# Patient Record
Sex: Female | Born: 1965 | Race: White | Hispanic: No | Marital: Married | Smoking: Never smoker
Health system: Southern US, Community
[De-identification: ages and names within clinical notes are randomized; demographics above are authoritative.]

## PROBLEM LIST (undated history)

## (undated) DIAGNOSIS — I10 Essential (primary) hypertension: Secondary | ICD-10-CM

## (undated) DIAGNOSIS — F32A Depression, unspecified: Secondary | ICD-10-CM

## (undated) DIAGNOSIS — F319 Bipolar disorder, unspecified: Secondary | ICD-10-CM

## (undated) DIAGNOSIS — M1990 Unspecified osteoarthritis, unspecified site: Secondary | ICD-10-CM

## (undated) DIAGNOSIS — F419 Anxiety disorder, unspecified: Secondary | ICD-10-CM

## (undated) DIAGNOSIS — F329 Major depressive disorder, single episode, unspecified: Secondary | ICD-10-CM

## (undated) DIAGNOSIS — G4730 Sleep apnea, unspecified: Secondary | ICD-10-CM

## (undated) HISTORY — PX: OTHER SURGICAL HISTORY: SHX169

## (undated) HISTORY — PX: BACK SURGERY: SHX140

## (undated) HISTORY — PX: ABDOMINAL HYSTERECTOMY: SHX81

## (undated) HISTORY — PX: TOE SURGERY: SHX1073

## (undated) HISTORY — PX: REDUCTION MAMMAPLASTY: SUR839

---

## 1998-03-26 ENCOUNTER — Inpatient Hospital Stay (HOSPITAL_COMMUNITY): Admission: AD | Admit: 1998-03-26 | Discharge: 1998-03-30 | Payer: Self-pay

## 1998-03-26 ENCOUNTER — Ambulatory Visit (HOSPITAL_COMMUNITY): Admission: RE | Admit: 1998-03-26 | Discharge: 1998-03-26 | Payer: Self-pay

## 1998-03-31 ENCOUNTER — Encounter (HOSPITAL_COMMUNITY): Admission: RE | Admit: 1998-03-31 | Discharge: 1998-06-29 | Payer: Self-pay

## 1998-05-04 ENCOUNTER — Encounter: Payer: Self-pay | Admitting: Family Medicine

## 1998-05-04 ENCOUNTER — Ambulatory Visit (HOSPITAL_COMMUNITY): Admission: RE | Admit: 1998-05-04 | Discharge: 1998-05-04 | Payer: Self-pay | Admitting: Family Medicine

## 1998-12-15 ENCOUNTER — Encounter: Admission: RE | Admit: 1998-12-15 | Discharge: 1999-01-29 | Payer: Self-pay | Admitting: Family Medicine

## 1999-06-25 ENCOUNTER — Other Ambulatory Visit: Admission: RE | Admit: 1999-06-25 | Discharge: 1999-06-25 | Payer: Self-pay | Admitting: Family Medicine

## 1999-11-16 ENCOUNTER — Inpatient Hospital Stay (HOSPITAL_COMMUNITY): Admission: EM | Admit: 1999-11-16 | Discharge: 1999-11-17 | Payer: Self-pay | Admitting: Psychiatry

## 2000-05-01 ENCOUNTER — Encounter: Payer: Self-pay | Admitting: Family Medicine

## 2000-05-01 ENCOUNTER — Ambulatory Visit (HOSPITAL_COMMUNITY): Admission: RE | Admit: 2000-05-01 | Discharge: 2000-05-01 | Payer: Self-pay | Admitting: Family Medicine

## 2001-09-06 ENCOUNTER — Other Ambulatory Visit: Admission: RE | Admit: 2001-09-06 | Discharge: 2001-09-06 | Payer: Self-pay | Admitting: Family Medicine

## 2001-12-06 ENCOUNTER — Ambulatory Visit (HOSPITAL_BASED_OUTPATIENT_CLINIC_OR_DEPARTMENT_OTHER): Admission: RE | Admit: 2001-12-06 | Discharge: 2001-12-06 | Payer: Self-pay | Admitting: Family Medicine

## 2001-12-15 ENCOUNTER — Encounter: Payer: Self-pay | Admitting: Family Medicine

## 2001-12-15 ENCOUNTER — Ambulatory Visit (HOSPITAL_COMMUNITY): Admission: RE | Admit: 2001-12-15 | Discharge: 2001-12-15 | Payer: Self-pay | Admitting: Family Medicine

## 2002-09-11 ENCOUNTER — Other Ambulatory Visit: Admission: RE | Admit: 2002-09-11 | Discharge: 2002-09-11 | Payer: Self-pay | Admitting: Family Medicine

## 2002-09-16 ENCOUNTER — Encounter: Payer: Self-pay | Admitting: Internal Medicine

## 2002-09-30 ENCOUNTER — Ambulatory Visit (HOSPITAL_COMMUNITY): Admission: RE | Admit: 2002-09-30 | Discharge: 2002-09-30 | Payer: Self-pay | Admitting: Family Medicine

## 2002-09-30 ENCOUNTER — Encounter: Payer: Self-pay | Admitting: Family Medicine

## 2003-04-15 ENCOUNTER — Inpatient Hospital Stay (HOSPITAL_COMMUNITY): Admission: EM | Admit: 2003-04-15 | Discharge: 2003-04-18 | Payer: Self-pay | Admitting: Emergency Medicine

## 2003-04-25 ENCOUNTER — Inpatient Hospital Stay (HOSPITAL_COMMUNITY): Admission: EM | Admit: 2003-04-25 | Discharge: 2003-05-02 | Payer: Self-pay | Admitting: Psychiatry

## 2003-04-30 ENCOUNTER — Encounter: Payer: Self-pay | Admitting: Neurology

## 2003-08-07 ENCOUNTER — Encounter: Payer: Self-pay | Admitting: Internal Medicine

## 2003-08-15 ENCOUNTER — Ambulatory Visit (HOSPITAL_COMMUNITY): Admission: RE | Admit: 2003-08-15 | Discharge: 2003-08-15 | Payer: Self-pay | Admitting: Family Medicine

## 2003-11-05 ENCOUNTER — Ambulatory Visit (HOSPITAL_COMMUNITY): Admission: RE | Admit: 2003-11-05 | Discharge: 2003-11-05 | Payer: Self-pay | Admitting: Family Medicine

## 2004-02-09 ENCOUNTER — Encounter
Admission: RE | Admit: 2004-02-09 | Discharge: 2004-03-19 | Payer: Self-pay | Admitting: Physical Medicine & Rehabilitation

## 2004-02-13 ENCOUNTER — Other Ambulatory Visit: Admission: RE | Admit: 2004-02-13 | Discharge: 2004-02-13 | Payer: Self-pay | Admitting: Family Medicine

## 2004-03-05 ENCOUNTER — Encounter
Admission: RE | Admit: 2004-03-05 | Discharge: 2004-05-07 | Payer: Self-pay | Admitting: Physical Medicine & Rehabilitation

## 2004-03-19 ENCOUNTER — Encounter
Admission: RE | Admit: 2004-03-19 | Discharge: 2004-06-17 | Payer: Self-pay | Admitting: Physical Medicine & Rehabilitation

## 2004-03-23 ENCOUNTER — Ambulatory Visit: Payer: Self-pay | Admitting: Physical Medicine & Rehabilitation

## 2004-03-24 ENCOUNTER — Ambulatory Visit (HOSPITAL_COMMUNITY): Admission: RE | Admit: 2004-03-24 | Discharge: 2004-03-24 | Payer: Self-pay | Admitting: Family Medicine

## 2004-05-25 ENCOUNTER — Ambulatory Visit: Payer: Self-pay | Admitting: Physical Medicine & Rehabilitation

## 2004-07-20 ENCOUNTER — Encounter
Admission: RE | Admit: 2004-07-20 | Discharge: 2004-10-18 | Payer: Self-pay | Admitting: Physical Medicine & Rehabilitation

## 2004-07-22 ENCOUNTER — Ambulatory Visit: Payer: Self-pay | Admitting: Physical Medicine & Rehabilitation

## 2004-09-16 ENCOUNTER — Ambulatory Visit: Payer: Self-pay | Admitting: Physical Medicine & Rehabilitation

## 2004-11-10 ENCOUNTER — Encounter
Admission: RE | Admit: 2004-11-10 | Discharge: 2005-02-08 | Payer: Self-pay | Admitting: Physical Medicine & Rehabilitation

## 2004-11-11 ENCOUNTER — Ambulatory Visit: Payer: Self-pay | Admitting: Physical Medicine & Rehabilitation

## 2005-01-06 ENCOUNTER — Ambulatory Visit: Payer: Self-pay | Admitting: Physical Medicine & Rehabilitation

## 2005-02-09 ENCOUNTER — Encounter
Admission: RE | Admit: 2005-02-09 | Discharge: 2005-05-10 | Payer: Self-pay | Admitting: Physical Medicine & Rehabilitation

## 2005-02-10 ENCOUNTER — Ambulatory Visit: Payer: Self-pay | Admitting: Physical Medicine & Rehabilitation

## 2005-03-08 ENCOUNTER — Ambulatory Visit: Payer: Self-pay | Admitting: Physical Medicine & Rehabilitation

## 2005-03-31 ENCOUNTER — Other Ambulatory Visit: Admission: RE | Admit: 2005-03-31 | Discharge: 2005-03-31 | Payer: Self-pay | Admitting: Family Medicine

## 2005-05-02 ENCOUNTER — Ambulatory Visit: Payer: Self-pay | Admitting: Physical Medicine & Rehabilitation

## 2005-05-29 ENCOUNTER — Encounter
Admission: RE | Admit: 2005-05-29 | Discharge: 2005-08-27 | Payer: Self-pay | Admitting: Physical Medicine & Rehabilitation

## 2005-06-29 ENCOUNTER — Ambulatory Visit: Payer: Self-pay | Admitting: Physical Medicine & Rehabilitation

## 2005-08-26 ENCOUNTER — Encounter
Admission: RE | Admit: 2005-08-26 | Discharge: 2005-11-24 | Payer: Self-pay | Admitting: Physical Medicine & Rehabilitation

## 2005-08-26 ENCOUNTER — Ambulatory Visit: Payer: Self-pay | Admitting: Physical Medicine & Rehabilitation

## 2005-12-22 ENCOUNTER — Ambulatory Visit: Payer: Self-pay | Admitting: Family Medicine

## 2006-01-12 ENCOUNTER — Ambulatory Visit: Payer: Self-pay | Admitting: Sports Medicine

## 2006-03-15 ENCOUNTER — Ambulatory Visit: Payer: Self-pay | Admitting: Family Medicine

## 2006-05-08 ENCOUNTER — Other Ambulatory Visit: Admission: RE | Admit: 2006-05-08 | Discharge: 2006-05-08 | Payer: Self-pay | Admitting: Family Medicine

## 2006-08-17 ENCOUNTER — Encounter: Admission: RE | Admit: 2006-08-17 | Discharge: 2006-10-23 | Payer: Self-pay | Admitting: Family Medicine

## 2007-01-26 ENCOUNTER — Encounter (INDEPENDENT_AMBULATORY_CARE_PROVIDER_SITE_OTHER): Payer: Self-pay | Admitting: Family Medicine

## 2007-01-26 ENCOUNTER — Ambulatory Visit (HOSPITAL_COMMUNITY): Admission: RE | Admit: 2007-01-26 | Discharge: 2007-01-26 | Payer: Self-pay | Admitting: Family Medicine

## 2007-01-26 ENCOUNTER — Ambulatory Visit: Payer: Self-pay | Admitting: Vascular Surgery

## 2007-07-27 ENCOUNTER — Ambulatory Visit: Payer: Self-pay | Admitting: Internal Medicine

## 2007-07-27 DIAGNOSIS — G4733 Obstructive sleep apnea (adult) (pediatric): Secondary | ICD-10-CM | POA: Insufficient documentation

## 2007-08-17 ENCOUNTER — Encounter: Payer: Self-pay | Admitting: Internal Medicine

## 2007-08-17 ENCOUNTER — Ambulatory Visit (HOSPITAL_BASED_OUTPATIENT_CLINIC_OR_DEPARTMENT_OTHER): Admission: RE | Admit: 2007-08-17 | Discharge: 2007-08-17 | Payer: Self-pay | Admitting: Internal Medicine

## 2007-08-25 ENCOUNTER — Ambulatory Visit: Payer: Self-pay | Admitting: Internal Medicine

## 2007-08-30 ENCOUNTER — Ambulatory Visit: Payer: Self-pay | Admitting: Internal Medicine

## 2007-09-05 ENCOUNTER — Telehealth (INDEPENDENT_AMBULATORY_CARE_PROVIDER_SITE_OTHER): Payer: Self-pay | Admitting: *Deleted

## 2007-10-29 ENCOUNTER — Ambulatory Visit: Payer: Self-pay | Admitting: Internal Medicine

## 2008-02-01 ENCOUNTER — Other Ambulatory Visit: Admission: RE | Admit: 2008-02-01 | Discharge: 2008-02-01 | Payer: Self-pay | Admitting: Family Medicine

## 2008-02-28 ENCOUNTER — Ambulatory Visit: Payer: Self-pay | Admitting: Internal Medicine

## 2008-03-14 ENCOUNTER — Encounter: Payer: Self-pay | Admitting: Internal Medicine

## 2009-03-06 ENCOUNTER — Telehealth: Payer: Self-pay | Admitting: Internal Medicine

## 2009-08-18 ENCOUNTER — Encounter: Admission: RE | Admit: 2009-08-18 | Discharge: 2009-11-16 | Payer: Self-pay | Admitting: Family Medicine

## 2009-11-05 ENCOUNTER — Encounter: Admission: RE | Admit: 2009-11-05 | Discharge: 2009-11-05 | Payer: Self-pay | Admitting: Family Medicine

## 2009-11-10 ENCOUNTER — Encounter: Admission: RE | Admit: 2009-11-10 | Discharge: 2009-11-10 | Payer: Self-pay | Admitting: Family Medicine

## 2009-11-18 ENCOUNTER — Encounter: Admission: RE | Admit: 2009-11-18 | Discharge: 2009-12-16 | Payer: Self-pay | Admitting: Family Medicine

## 2009-12-14 ENCOUNTER — Ambulatory Visit (HOSPITAL_BASED_OUTPATIENT_CLINIC_OR_DEPARTMENT_OTHER): Admission: RE | Admit: 2009-12-14 | Discharge: 2009-12-14 | Payer: Self-pay | Admitting: Specialist

## 2010-01-18 HISTORY — PX: BREAST SURGERY: SHX581

## 2010-01-22 ENCOUNTER — Encounter: Admission: RE | Admit: 2010-01-22 | Discharge: 2010-01-22 | Payer: Self-pay | Admitting: Family Medicine

## 2010-03-11 ENCOUNTER — Other Ambulatory Visit: Admission: RE | Admit: 2010-03-11 | Discharge: 2010-03-11 | Payer: Self-pay | Admitting: Family Medicine

## 2010-08-24 ENCOUNTER — Other Ambulatory Visit: Payer: Self-pay | Admitting: Obstetrics and Gynecology

## 2010-09-05 LAB — DIFFERENTIAL
Eosinophils Relative: 9 % — ABNORMAL HIGH (ref 0–5)
Lymphocytes Relative: 33 % (ref 12–46)
Lymphs Abs: 1.8 10*3/uL (ref 0.7–4.0)
Monocytes Absolute: 0.4 10*3/uL (ref 0.1–1.0)
Monocytes Relative: 8 % (ref 3–12)

## 2010-09-05 LAB — BASIC METABOLIC PANEL
Chloride: 104 mEq/L (ref 96–112)
GFR calc non Af Amer: >60 mL/min (ref >60)
Potassium: 3.4 mEq/L — ABNORMAL LOW (ref 3.5–5.1)
Sodium: 140 mEq/L (ref 135–145)

## 2010-09-05 LAB — CBC
HCT: 41.7 % (ref 36.0–46.0)
Hemoglobin: 14.1 g/dL (ref 12.0–15.0)
WBC: 5.4 10*3/uL (ref 4.0–10.5)

## 2010-10-26 ENCOUNTER — Encounter (HOSPITAL_COMMUNITY): Payer: MEDICARE

## 2010-10-26 ENCOUNTER — Other Ambulatory Visit: Payer: Self-pay | Admitting: Obstetrics and Gynecology

## 2010-10-26 LAB — CBC
HCT: 37.9 % (ref 36.0–46.0)
MCH: 31.4 pg (ref 26.0–34.0)
MCHC: 33 g/dL (ref 30.0–36.0)
MCV: 95.2 fL (ref 78.0–100.0)
RDW: 13.6 % (ref 11.5–15.5)

## 2010-10-26 LAB — BASIC METABOLIC PANEL
Chloride: 99 mEq/L (ref 96–112)
Creatinine, Ser: 0.82 mg/dL (ref 0.4–1.2)
GFR calc Af Amer: >60 mL/min (ref >60)
GFR calc non Af Amer: >60 mL/min (ref >60)
Potassium: 3.2 mEq/L — ABNORMAL LOW (ref 3.5–5.1)

## 2010-10-26 LAB — URINALYSIS, ROUTINE W REFLEX MICROSCOPIC
Bilirubin Urine: NEGATIVE
Glucose, UA: NEGATIVE mg/dL
Ketones, ur: NEGATIVE mg/dL
pH: 6.5 (ref 5.0–8.0)

## 2010-10-26 LAB — SURGICAL PCR SCREEN
MRSA, PCR: NEGATIVE
Staphylococcus aureus: NEGATIVE

## 2010-10-26 LAB — URINE MICROSCOPIC-ADD ON

## 2010-11-02 ENCOUNTER — Other Ambulatory Visit: Payer: Self-pay | Admitting: Obstetrics and Gynecology

## 2010-11-02 ENCOUNTER — Inpatient Hospital Stay (HOSPITAL_COMMUNITY)
Admission: RE | Admit: 2010-11-02 | Discharge: 2010-11-04 | DRG: 743 | Disposition: A | Discharge status: Home / Self Care | Payer: MEDICARE | Source: Ambulatory Visit | Attending: Obstetrics and Gynecology | Admitting: Obstetrics and Gynecology

## 2010-11-02 DIAGNOSIS — Z01812 Encounter for preprocedural laboratory examination: Secondary | ICD-10-CM

## 2010-11-02 DIAGNOSIS — N7013 Chronic salpingitis and oophoritis: Secondary | ICD-10-CM | POA: Diagnosis present

## 2010-11-02 DIAGNOSIS — N920 Excessive and frequent menstruation with regular cycle: Principal | ICD-10-CM | POA: Diagnosis present

## 2010-11-02 DIAGNOSIS — D259 Leiomyoma of uterus, unspecified: Secondary | ICD-10-CM | POA: Diagnosis present

## 2010-11-02 DIAGNOSIS — N926 Irregular menstruation, unspecified: Secondary | ICD-10-CM | POA: Diagnosis present

## 2010-11-02 DIAGNOSIS — Z01818 Encounter for other preprocedural examination: Secondary | ICD-10-CM

## 2010-11-02 DIAGNOSIS — N83209 Unspecified ovarian cyst, unspecified side: Secondary | ICD-10-CM | POA: Diagnosis present

## 2010-11-02 NOTE — Procedures (Signed)
NAME:  Shelley Vasquez, Shelley Vasquez                ACCOUNT NO.:  [id number]   MEDICAL RECORD NO.:  [id number]          PATIENT TYPE:  OUT   LOCATION:  SLEEP CENTER                 FACILITY:  [facility]   PHYSICIAN:  Clinton D. Maple Hudson, MD, FCCP, FACPDATE OF BIRTH:  06-27-66   DATE OF STUDY:  08/17/2007                            NOCTURNAL POLYSOMNOGRAM   REFERRING PHYSICIAN:  Clinton D. Young, MD, FCCP, FACP   INDICATION FOR STUDY:  Hypersomnia with sleep apnea.   EPWORTH SLEEPINESS SCORE:  18/24, BMI 42, weight 220 pounds, height 60.7  inches, neck 15 inches.   MEDICATIONS:  Home medication charted and reviewed.   This patient had a study on December 06, 2001, recording an AHI of 32/hr.  At that time she weighed 280 pounds.  Subsequently, she lost and then  regained some weight.   SLEEP ARCHITECTURE:  Split study protocol.  During the diagnostic phase  total sleep time was 127 minutes with sleep efficiency 80.4%.  Stage I  was 3.1%, stage II 80.3%, stage III absent, REM 16.5% of total sleep  time.  Sleep latency 16.5 minutes, REM latency 96 minutes, awake after  sleep onset 14.5 minutes, arousal index 2.8.  At bedtime, she took  cyclobenzaprine, metoprolol, pantoprazole, lamotrigine and temazepam.   RESPIRATORY DATA:  Split study protocol.  Apnea-hypopnea index (AHI)  15.1 events per hour, indicating mild to moderate obstructive apnea  before CPAP.  This included a total of 32 events, with 13 obstructive  apneas, 3 central apneas and 16 hypopneas before CPAP.  Events were  associated with supine sleep.  CPAP was titrated to 8 CWP with little  impact on respiratory events and an associated AHI of 100.  She was then  placed on BiPAP and titrated to inspiratory 9, expiratory 5 with  residual central apneas.  Most events were central apneas associated  with all settings above a CPAP of 7 CWP.  A small Mirage Quattro mask  was used with heated humidifier.   OXYGEN DATA:  Significant snoring before CPAP  was completely prevented  by CPAP with oxygen desaturation before CPAP to a nadir of 80%.  After  CPAP control, oxygen saturation held 97% on room air.   CARDIAC DATA:  Normal sinus rhythm.   MOVEMENT-PARASOMNIA:  No significant movement disturbance, bathroom x2.   IMPRESSIONS-RECOMMENDATIONS:  1. Mild to moderate obstructive sleep apnea/hypopnea syndrome, apnea-      hypopnea index 15.1 per hour, with events most common while supine.      Moderate snoring and oxygen desaturation to a nadir of 80%.  2. Continuous positive airway pressure and bilevel positive airway      pressure titration were attempted with residual central apneas not      affected by pressure settings.  The most appropriate pressures      seemed to be at 8      centimeters of water pressure, where all obstructive events were      prevented and only residual central apneas were noted.  A small      Mirage Quattro mask was used with heated humidifier.      Clinton D. Maple Hudson, MD, FCCP,  Jerrel Ivory  Diplomate, Biomedical engineer of Sleep Medicine  Electronically Signed     CDY/MEDQ  D:  08/25/2007 15:57:28  T:  08/26/2007 12:56:30  Job:  [id number]

## 2010-11-03 LAB — CBC
Platelets: 260 10*3/uL (ref 150–400)
RDW: 13.5 % (ref 11.5–15.5)
WBC: 8.4 10*3/uL (ref 4.0–10.5)

## 2010-11-05 NOTE — Discharge Summary (Signed)
NAME:  Shelley, Vasquez                          ACCOUNT NO.:  [id number]   MEDICAL RECORD NO.:  [id number]                   PATIENT TYPE:  INP   LOCATION:  0457                                 FACILITY:  [facility]   PHYSICIAN:  Sherin Quarry, MD                   DATE OF BIRTH:  08-01-1965   DATE OF ADMISSION:  04/15/2003  DATE OF DISCHARGE:                                 DISCHARGE SUMMARY   HISTORY:  Shelley Vasquez is a 45 year old lady with chronic bipolar disorder.  According to the patient about one month ago, she was placed Lamictal by her  psychiatrist, Dr. Nolen Mu, two weeks later the dose was increased. After  this, the husband states that he began to notice that she was having  recurrent nausea and vomiting. It was the husband's feeling that this was  probably secondary to the Lamictal. This was stopped five days prior to  admission but the patient continued to have nausea and vomiting as well as  increased mental confusion. Dr. Cliffton Asters has been trying to control her  symptoms as an outpatient but the patient's symptoms did not respond to  Phenergan. Because her severe vomiting, dehydration and confusion persisted  she was admitted on October 26.   PHYSICAL EXAMINATION ON ADMISSION:  GENERAL:  Obese, lethargic lady who was  tearful. She spoke in an unusual high pitched voice and seemed to be  confused.  HEENT:  Within normal limits.  CHEST:  Clear.  BACK:  Examination of the back revealed no CVA or point tenderness.  CARDIOVASCULAR:  Revealed normal S1 and S2. There were no rubs, murmurs or  gallops.  ABDOMEN:  Benign. There were normal bowel sounds without masses, tenderness  or organomegaly.  NEUROLOGIC/EXTREMITIES:  Neurologic testing and examination of the  extremities was normal.   LABORATORY DATA:  Laboratory studies obtained were especially remarkable for  a lithium level of 3.43, the therapeutic range being 0.8 to 1.4. CMET was  normal except for a potassium of 3.4,  lipase was 112, hemoglobin was 11.8,  white count was 5900.   On the basis of these studies, the working diagnosis at the time of  admission was lithium toxicity. An abdominal ultrasound was obtained which  showed sludge in the gallbladder and was otherwise normal. On admission, the  patient's lithium dose was discontinued. Her Duragesic patch was cut to a 35  mcg patch. her blood pressure medicines were held because of relatively low  back pain possibly secondary to diuresis. IV fluids were administered and  the patient was carefully monitored. She became relatively hypokalemic and  was given potassium replacement. By October 29, the patient was very alert.  Her lithium level at that time was down to 1.6 which was felt to be close  enough to the therapeutic range to allow her to be safely discharged. By  that time, her potassium was up  to 4.2 and renal function was normal. On  October 29, the patient was discharged.   DISCHARGE DIAGNOSES:  1. Lithium toxicity.  2. Bipolar disorder.  3. Peptic ulcer.  4. Chronic back pain.  5. Hypertension.  6. Obesity.  7. Polycystic ovary syndrome.  8. Sleep apnea.  9. Chronic migraine headaches.   On discharge, the patient was advised to continue her Glucophage for the  polycystic ovary syndrome, Keppra in the tapering schedule that she had been  advised, Protonix 20 mg b.i.d., Topamax 100 mg in the morning and 200 mg at  bedtime and Wellbutrin XR 150 mg daily. She was advised to resume Hyzaar on  Saturday but to withhold verapamil until she returned to see Dr. Cliffton Asters. She  was advised to cut her Duragesic patch to a 25 mcg patch. She was advised to  hold lithium over the weekend and then start at a very small dose of 300 mg  daily. I suggested that she return to Dr. Lucilla Lame office on Friday to  recheck her lithium level and to recheck her blood pressure. I told her not  to take any verapamil until Dr. Cliffton Asters saw her. I emphasized to her the   importance of carefully monitoring the lithium level and the interactions  between all of her medications.                                               Sherin Quarry, MD    SY/MEDQ  D:  04/18/2003  T:  04/18/2003  Job:  [id number]   cc:   Stacie Acres. White, M.D.  [address]  [place]  [place] [address]  Fax: [phone]   Andee Poles, M.D.  [address], [address]  [place], [place] [address]  Fax: [phone]   Antonietta Breach, M.D.  [address]  [place], [place] [address]  Fax: [phone]

## 2010-11-05 NOTE — H&P (Signed)
NAME:  Shelley Vasquez, Shelley Vasquez NO.:  [id number]   MEDICAL RECORD NO.:  [id number]                   PATIENT TYPE:  EMS   LOCATION:  ED                                   FACILITY:  [facility]   PHYSICIAN:  Sherin Quarry, MD                   DATE OF BIRTH:  [birth year]   DATE OF ADMISSION:  04/15/2003  DATE OF DISCHARGE:                                HISTORY & PHYSICAL   HISTORY OF PRESENT ILLNESS:  Shelley Vasquez is a 45 year old lady who is a  Jehov[employer] and has chronic bipolar disorder.  Apparently about a  month ago the patient was placed on Lamictal by her psychiatrist, Dr.  Nolen Mu.  Two weeks later the dose was increased.  After this the husband  said that he began to notice that she was having recurrent nausea and  vomiting.  The husband attributes these symptoms to the Lamictal.  The  Lamictal was discontinued about five days ago.  She has continued to have  intermittent episodes of nausea and vomiting since that time.  Dr. Cliffton Asters has  been working with her to try to control her symptoms as an outpatient.  The  patient was told to take Phenergan for a three day period and take clear  liquids.  She seemed to transiently get better, but then on Monday, severe  vomiting returned.  At this point the patient was admitted because of  evidence of dehydration.  There has been no fevers or chills, no breathing  difficulty.  No abdominal pain, no melena or hematochezia.   PAST MEDICAL HISTORY:   MEDICATIONS:  1. The patient takes Glucophage 500 mg b.i.d. because of polycystic ovary     syndrome.  2. She takes Keppra 1.5 g b.i.d.  3. Protonix 20 mg b.i.d.  4. Verapamil 120 mg daily.  5. Topamax 100 mg in the morning, 200 mg at bedtime.  6. Hyzaar 50/12.5 one daily.  7. Lithobid 300 mg in the morning, 600 mg in the evening.  8. She uses a Duragesic patch, 50 mcg every three days.   ALLERGIES:  1. She is allergic to ACE INHIBITORS  2. NONSTEROIDAL  ANTI-INFLAMMATORY DRUGS.   MEDICAL ILLNESSES:  1. Bipolar disorder.  The patient has been hospitalized in the [facility] with episodes of mania in the past.  She is followed very     closely by Dr. Nolen Mu.  2. There is a history of peptic ulcer with associated GI bleeding which was     attributed to nonsteroidal anti-inflammatory drugs.  3. The patient complains of chronic back pain.  This is the reason that she     receives the Duragesic patch.  4. She has chronic migraine headaches.  5. The patient has chronic hypertension which is said to be well regulated.   PAST SURGICAL HISTORY:  She has  had no operations.   FAMILY HISTORY:  The patient's father has a history of hypertension,  diabetes, and coronary artery disease.   SOCIAL HISTORY:  The patient does not smoke.  She denies the use of alcohol  or drugs.   REVIEW OF SYSTEMS:  HEAD:  She denies headache or dizziness at this time.  EYES:  She denies visual blurring or diplopia.  EARS, NOSE, AND THROAT:  She  denies earaches, sinus pain, or sore throat.  CHEST:  She denies coughing,  wheezing, or chest congestion.  CARDIOVASCULAR:  She denies orthopnea, PND,  or ankle edema.  GASTROINTESTINAL:  See above.  GENITOURINARY:  Her urine  output has decreased.  GYN:  There has been no unusual bleeding or  discharge.  NEUROLOGIC:  No history of seizure or stroke.   PHYSICAL EXAMINATION:  GENERAL:  She is an obese lady who is lethargic and  is somewhat tearful.  HEENT:  Within normal limits.  CHEST:  Clear.  BACK:  Examination of the back reveals no CVA or point tenderness.  CARDIOVASCULAR:  Reveals normal S1, S2, without rubs, murmurs, or gallops.  ABDOMEN:  Completely benign, had normal bowel sounds.  There are no masses  or tenderness.  No guarding or rebound.  NEUROLOGIC/EXTREMITIES:  Neurologic testing and examination of extremities  is normal.   IMPRESSION:  1. Chronic nausea and vomiting, possible mild  pancreatitis.  Symptoms appear     to be secondary to medications.  2. Bipolar disorder.  3. Peptic ulcer.  4. Chronic back pain.  5. Hypertension.  6. Obesity.  7. Polycystic ovary syndrome.  8. Sleep apnea.  9. Chronic migraine headaches.   PLAN:  Plan will be to admit the patient to the hospital for IV hydration  and monitoring.  Will get some help from Dr. Jeanie Sewer in regard to her  psychiatric problems.  Will be sure to check a Lithium level.  It would  probably be a good idea to get an ultrasound to evaluate her pancreas.  Will  follow her closely.                                                   Sherin Quarry, MD    SY/MEDQ  D:  04/15/2003  T:  04/15/2003  Job:  [id number]   cc:   Stacie Acres. White, M.D.  [address]  [place]  [place] [address]  Fax: [phone]

## 2010-11-05 NOTE — Assessment & Plan Note (Signed)
MEDICAL RECORD NUMBER:  [id number]   DATE OF BIRTH:  1965-08-13   Pain level is 5/10 on average, up to 6.5. Exacerbation secondary to painting  her house with the help of her mother. She worked about 4 hours a day for 2  day on this. This was interior painting and she only painted at the level of  her head and lower. She did do some floorboards. Pain interference scores:  General activity 7, relationships with other people 5, enjoyment of life 5.  Sleep is fair. Pain improves with rest, stretching. Has not been pacing  herself well and her husband has concerns about that. It is worse with  bending and sitting as well as standing. She is not driving due to  medications.   MEDICATION CHANGES:  Since I last saw her, her psychiatrist took her off of  Abilify because of excessive weight gain. She was started on Benicar for  blood pressure and also started on Protonix.   REVIEW OF SYSTEMS:  Negative for suicidal thoughts.   SOCIAL HISTORY:  Accompanied by her husband today, who works as an Art gallery manager.  She requires some help around the house. Her mother does help her with some  of the housework.   EXAMINATION:  Blood pressure 121/40, pulse 96, respirations 16, O2  saturation 96% room air.   In general, no acute distress. Mood and affect appropriate. Her back has  mild tenderness to lumbosacral junction. She has 75% range forward flexion  but accompanied by pain. Extension is 25% accompanied by pain. Lateral  bending and rotation is 50%. She has 1/4 bilateral patellar and ankle  reflexes with facilitation. She has good hip, ankle, knee range of motion.  No calf tenderness.   IMPRESSION:  Congenital lumbar stenosis, some exacerbation of pain. May have  had some discogenic pain. Overall she seems to be improving, and no reason  to change treatment.   PLAN:  Continue Duragesic patch 50 mcg q.72h. I went over pacing activities  and limiting her more strenuous activities to no more than  2 hours per day,  but overall that she needs to do something steadily every day rather than  too much on 1 day and staying in bed for the next 2 days.      Erick Colace, M.D.  Electronically Signed     AEK/MedQ  D:  05/03/2005 13:10:15  T:  05/03/2005 16:16:52  Job #:  [id number]

## 2010-11-05 NOTE — Discharge Summary (Signed)
[facility]  Patient:    Shelley Vasquez, Shelley Vasquez                       MRN: [id number] Adm. Date:  [id number] Disc. Date: [id number] Attending:  Fortunato Curling                           Discharge Summary  REASON FOR ADMISSION:  A 45 year old married white female who was admitted status post an overdose and subsequent charcoal lavage with medical clearance from [facility] ER.  ADMITTING DIAGNOSES: Axis I:    Major depressive disorder, recurrent, severe, status post            an overdose. Axis II:   Borderline personality traits. Axis III:  Migraines, acid reflux, history of ulcers and arthritis in her            back. Axis IV:   Problems with primary support group. Axis V:    On admission was 40.  LABS:  The patient had the following labs on this admission:  CBC which showed no abnormalities, metabolic panel showed albumin slightly low at 3.2, an ALP low at 37.  Her thyroid functions were within normal limits.  Urine drug screen was negative.  Her urinalysis was also negative.  There were no other labs or procedures upon this admission.  HOSPITAL COURSE:  Patient was admitted to [facility] unit and was restarted on her previous medications, which included Depakote 500 mg 1 p.o. q.a.m. and 1.5 p.o. q.h.s., Effexor XR 150 mg 1 p.o. b.i.d., Neurontin 100 mg 1 p.o. b.i.d. and 2 p.o. q.h.s., Sonata 10 mg at bedtime, Maxzide 25 mg p.o. q.d., and Asofex 20 mg p.o. q.d.  Patient did receive a valproic acid level during her hospitalization, which was low at 35.9.  Initially on the unit, the patient continued to be despondent and tearful.  She was concerned about possible separation in her marriage as things had escalated over an argument with her husband.  She however within 24 hours of hospitalization had overcome her suicidal thoughts, had a family session with her husband which went well, and they made a decision that she could return home   and that they would go to marital therapy.  Patient quickly stabilized and affect became much brighter as the psychosocial stressors were addressed.  She was participating in all unit activities.  She was eating well, sleeping well. Affect was bright and on the day of discharge she was able to contract for safety and was expressing no further suicidal ideation.  Referrals are made to [facility] for marital therapy to to continue with myself as an outpatient for medication management.  I did recommend that the patient remain out of work for the remainder of the week, and to restart work on the Monday after her discharge.  She was therefore discharged in stable condition.  DISCHARGE DIAGNOSES: Axis I:    Major depressive disorder, recurrent, severe. Axis II:   Borderline personality traits. Axis III:  Migraines, acid reflux, history of ulcers and arthritis in            her back. Axis IV:   Problems with primary support group. Axis V:    Upon discharge, 70.  DISCHARGE MEDICATIONS: 1. Depakote 500 mg p.o. q.a.m. and 750 mg p.o. q.h.s. 2. Effexor XR 150 mg 1 p.o. b.i.d. 3. Neurontin 100 mg 1 p.o. b.i.d.  and 2 p.o. q.h.s. 4. Sonata 10 mg 1 p.o. q.h.s. p.r.n. insomnia. 5. Maxzide 25 mg p.o. q.d. 6. Asofex 20 mg p.o. q.d.  ACTIVITY:  No restrictions.  DIET:  No restrictions.  They were given the number with [facility] to follow up for marital therapy.  A message was left on Ms. Fondreys machine, and also to follow up with myself on 6/12 at 12:15. DD:  11/23/99 TD:  11/25/99 Job: [id number]

## 2010-11-05 NOTE — Assessment & Plan Note (Signed)
HISTORY:  Ms. Kondo returns today after I last saw her on May 25, 2004.  A 45 year old female.  The patient's foot pain is her main complaint  at this point. She is having some adjustments to her psychiatric medications  and has been tapering off the Topamax. She has been started Lamictal at a  low dose 50 mg a day.  She continues on Cymbalta 60 mg p.o. daily.  She  continues on amphetamine 15 mg t.i.d.   Her back pain is better. The last time I saw her she slipped and hurt her  back.  This has improved again.  The pain is made worse by bending, pulling  and sitting.  She has more pain bending backwards than forward.   SOCIAL HISTORY:  She is married, disabled since June 20, 2002 psychiatric  and back.   REVIEW OF SYMPTOMS:  Basically positive under neuropsyche, negative for  suicidal thoughts, however.  She has positive GI symptoms with reflux,  heartburn, constipation.   PHYSICAL EXAMINATION:  Blood pressure 120/73, pulse 74, respiratory rate 18,  O2 sat 99% on room air.  In no acute distress.  Mood and affect  ___flat_______, but otherwise appropriate.  She has mild tenderness to  palpation over the soft tissue superior to the greater trochanter  bilaterally. She has no pain with hip range of motion, full hip, knee, ankle  range of motion.  Normal strength bilateral lower extremities.  Normal deep  tendon reflexes bilateral lower extremities. She is able to heel walk and  toe walk.   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  Overall,  I think her back pain has been improving and in fact I think we could wean  down on the Duragesic as I mentioned in the last note.  I did tell her that  there is intermediate dosing that we could use i.e. 37 mcg.  In addition, I  explained about her leg pain increasing more likely due to her Topamax wean  and perhaps once her Lamictal dose has increased if it is this might help  with her lower extremity pain.  Her  Cymbalta should be helping with this as  well.   PLAN:  I will see her back in one month and will see how she does on the 37  mcg of Duragesic.      AEK/MedQ  D:  07/22/2004 17:49:08  T:  07/22/2004 20:12:13  Job #:  [id number]   cc:   Stacie Acres. White, M.D.  [address]  [place]  [place] [address]  Fax: [phone]   Milagros Evener, M.D.  [address] [address]  [place], [place] [address]  Fax: [phone]   Reather Littler, M.D.  [address]  [place]  [place] [address]  Fax: [phone]   Stefani Dama, M.D.  [address]  [place]  [place] [address]  Fax: [phone]

## 2010-11-05 NOTE — Assessment & Plan Note (Signed)
MEDICAL RECORD NUMBER:  [id number].   Shelley Vasquez returns today after I last saw her on July 22, 2004, a 45-  year-old female with severe depression, multiple psychiatric  hospitalizations, as well as congential lumbar stenosis. Since last visit,  she has tapered off of her Topamax. She is now on Lamictal as well as  Cymbalta, trazodone. In terms of pain, we reduced her Duragesic to 37. She  continues to take very occasional dose of Percocet 5/325, the bottle she has  had since 2004 and has not run out yet. She continues to use Lidoderm patch  as well.   Her pain shows pain mainly along her neck and low back areas as well as  around the hips, going about 5/10. Improves with rest, therapy, medications.  Worse with bending, sitting, and standing in place. Continues to see  psychiatry, neurology, as well as myself as well as the primary care doctor.   REVIEW OF SYSTEMS:  Positive for multiple areas as noted in review. Negative  for suicidal thoughts.   PHYSICAL EXAMINATION:  Shows blood pressure 142/78, pulse 82, respirations  20. O2 saturation 98% on room air. Gait is normal. Affect is bright and  alert. Appearance is normal.   Back:  Moderate tenderness to palpation PSIS area bilaterally. Some  tenderness in the lumbar paraspinals as well. She has 50% forward flexion,  25% extension, full strength bilateral upper and lower extremities. She has  negative Phalen's. Normal sensation bilateral upper and lower extremities.  Normal deep tendon reflexes.   IMPRESSION:  1.  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      This seems generally improved.  2.  Lumbar facet arthropathy.  3.  History of depression.   PLAN:  1.  Will continue Duragesic at 37 mg dosage.  2.  I will see her back in one month. Consider going down to 25 mcg on her      Duragesic at that time.  3.  Continue Lidoderm patch.  4.  Will try Flexeril generic 1/2 p.o. t.i.d.      AEK/MedQ  D:  08/20/2004 13:30:18  T:  08/21/2004 08:30:54  Job #:  [id number]   cc:   Stacie Acres. White, M.D.  [address]  [place]  [place] [address]  Fax: [phone]   Reather Littler, M.D.  [address], [address]  [place]  [place] [address]  Fax: [phone]   Stefani Dama, M.D.  [address]  [place]  [place] [address]  Fax: [phone]
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
## 2010-11-05 NOTE — Assessment & Plan Note (Signed)
MEDICAL RECORD NUMBER:  [id number]   DATE OF BIRTH:  May 25, 1966   DATE OF SERVICE:  Nov 11, 2004   DATE OF LAST VISIT:  October 07, 2004   INTERVAL HISTORY:  [age]-year-old female with severe depression and  multiple psychiatric hospitalizations as well as congenital lumbar stenosis  with chronic low back pain.  Interval history essentially negative.  We have  tried to decrease her Duragesic to 25 mcg but her breakthrough pain was  increasing therefore, we went back up to 37.  She did in the interval time  have to use some extra oxycodone but still has 48 left and this is a  prescription that is over a year old.   She still has some sweating, was switched from Cymbalta to Lexapro, however,  she is continuing on trazodone 150 mg at bedtime.   SOCIAL HISTORY:  Married.  Disabled for psychiatric reasons since January  2004.   REVIEW OF SYSTEMS:  Fourteen-point review of systems reviewed.  No suicidal  ideation.   EXAMINATION:  Blood pressure 114/74, pulse 91, respiratory rate 16, O2  saturation 99% room air.   GENERAL:  Moderately obese female in no acute distress.  Orientation x3.  Affect is bright and alert.  Gait is normal.  Back has minimal tenderness to  palpation lumbosacral junction, she has 75% forward flexion, extension is  50% with increased pain.  She has full strength bilateral upper and lower  extremities.  Gait is without evidence of toe drag or knee instability.   IMPRESSION:  Congenital lumbar spinal stenosis.  No evidence of  radiculopathy.   PLAN:  1.  Continue current medications, i.e., Duragesic 37 mcg once daily as well      as cyclobenzaprine 5 t.i.d.  2.  We will defer to psychiatry to address sweating issues which appear      which may be a serotonin syndrome.  3.  Continue her current exercise program which includes a recliner bike and      stretches that she had learned in physical therapy.      AEK/MedQ  D:  11/11/2004 16:38:35  T:   11/11/2004 54:09:81  Job #:  [id number]   cc:   Milagros Evener, M.D.  [address] [address]  [place], [place] [address]  Fax: [phone]

## 2010-11-05 NOTE — Assessment & Plan Note (Signed)
REASON FOR VISIT:  Low back pain, facet syndrome, left greater than right  hip pain.   DATE OF LAST VISIT:  May 03, 2005   INTERVAL HISTORY:  Average pain is 5/10.  Pain interferes with general  activity 3/10, relationship with other people 1/10, enjoyment of life 4/10.  Pain is worse during the daytime.  Fair sleep.  The pain is worse with  prolonged standing.  Seventy percent relief with meds.   REVIEW OF SYSTEMS:  Positive for depression and anxiety.  Negative for  suicidal thoughts.   CURRENT MEDICATIONS:  Duragesic patch 50 mcg q.72 h.  Oxycodone 5 mg p.o.  daily.  Psychiatric medicines have been reduced.  She remains on Lamictal  200 mg a day, Wellbutrin XL 150 mg a day, has been taken off Abilify,  trazodone, and is working on get off dextroamphetamine.  She remains on  metformin and Zelnorm as nonpsychiatric medications.   EXAMINATION:  VITAL SIGNS:  Blood pressure 143/91, pulse 101, respiratory  rate 16, O2 saturation 98% room air.  GENERAL:  No acute distress.  Mood and affect appropriate.  BACK:  No tenderness to palpation.  MUSCULOSKELETAL:  Her left hip has tenderness over the greater trochanter  compared to the right side.   She has full strength bilateral lower extremities, normal spine motion  forward flexion but extension is limited about 50% limited by pain.   Range of motion good in lower extremities.  No hip range of motion  difficulties.   IMPRESSION:  1.  History of lumbar facet syndrome with chronic low back pain.  2.  Left hip trochanteric bursitis.   PLAN:  1.  Continue Duragesic 50 mcg q.72 h.  2.  Continue oxycodone 5 mg p.o. daily.  3.  Discussed treatment options for trochanteric bursitis.  Baseline would      be stretching, second would be nonsteroidals however she is unable to      tolerate these from a GI standpoint, therefore, the next treatment would      be injection of the trochanteric bursa.  She would like to hold off on      this  if she could but she will call us if this gets worse.      Erick Colace, M.D.  Electronically Signed     AEK/MedQ  D:  07/29/2005 12:54:53  T:  07/29/2005 22:37:38  Job #:  [id number]   cc:   Milagros Evener, M.D.  Fax: [phone]

## 2010-11-05 NOTE — Discharge Summary (Signed)
NAME:  Shelley Vasquez, SCHNARR NO.:  [id number]   MEDICAL RECORD NO.:  [id number]                   PATIENT TYPE:  IPS   LOCATION:  0301                                 FACILITY:  [facility]   PHYSICIAN:  Geoffery Lyons, M.D.                   DATE OF BIRTH:  1965/11/21   DATE OF ADMISSION:  04/25/2003  DATE OF DISCHARGE:  05/02/2003                                 DISCHARGE SUMMARY   CHIEF COMPLAINT AND PRESENT ILLNESS:  This was the first admission to [facility] but one of several admissions to psychiatric units  for this female voluntarily admitted.  Admitted due to suicidal ideation,  upset because she had not seen Dr. Nolen Mu since her discharge in October  29th and this was November 5th.  She was trying to start with Milagros Evener,  M.D. and could not get an appointment prior to May 21, 2003.  She  complained to the emergency room.  Complained of nausea and vomiting.  She  thought it was the Lamictal.  When it did not improve once Lamictal was  discontinued, she was taken to the hospital.  Lithium level was 3.4.  She  was admitted, was seen in consultation with Dr. Jeanie Sewer.  She was asked to  taper the Keppra and continue her Topamax, Wellbutrin and Hyzaar.  She  stabilized but continued to endorse suicidal ideation, so she was  transferred to [facility].   PAST PSYCHIATRIC HISTORY:  As already stated, she began being treated for  depression in 1992.  Later diagnosed with bipolar illness.  Three previous  admissions to [facility] with a prior overdose event.   ALCOHOL/DRUG HISTORY:  Denies the use or abuse of any substances.   MEDICAL HISTORY:  Polycystic ovarian disease, hypertension, gastroesophageal  reflux, spinal stenosis, chronic back pain.   MEDICATIONS:  Restoril 30 mg at night, Wellbutrin 150 mg, 1 in the morning,  Topamax 100 mg in the morning, 200 mg at night, Lithobid 300 mg, 1 in the  morning and 1 at night, Keppra (being tapered off), Glucophage 500 mg in the  morning and at night, Protonix 40 mg twice a day, Benicar 40 mg at night and  Duragesic patch 50 mcg/hr every 22 hours.   PHYSICAL EXAMINATION:  Performed and failed to show any acute findings.   MENTAL STATUS EXAM:  Alert, cooperative female.  Stated that, due to weight  loss, she has not bought any clothes.  Speech was normal production, rate  and tempo.  Mood was depressed, some psychomotor retardation.  Thought  process is clear, rational and goal-oriented.  Judgment and insight intact.  Cognition well-preserved.   ADMISSION DIAGNOSES:   AXIS I:  Bipolar disorder, depressed.   AXIS II:  No diagnosis.   AXIS III:  1. Migraines.  2. Polycystic ovarian disease.  3. Hypertension.  4. Sleep apnea.   AXIS IV:  Moderate.   AXIS V:  Global Assessment of Functioning upon admission 20; highest Global  Assessment of Functioning in the last year 65.   LABORATORY DATA:  CBC with hemoglobin 11.8.  Blood chemistry with chloride  113, glucose 101, SGOT 31, SGPT 46.  Thyroid profile within normal limits.  Lithium 0.54.  Drug screen positive for benzodiazepines.  Head CT scan  prominent to more present, but, there did not seem to be any change from the  last scan.   HOSPITAL COURSE:  She was admitted and started intensive individual and  group psychotherapy.  She was given some trazodone for sleep.  She was  maintained on Wellbutrin 150 mg daily, Topamax 100 mg in the morning and 200  mg at night.  Initially Lithobid 300 mg twice a day, Keppra 500 mg, tapering  off, Glucophage 500 mg in the morning and at night, Protonix 40 mg twice a  day.  She was also kept on the Duragesic patch and the Benicar.  Keppra was  decreased to 250 mg twice a day; then 250 mg daily with plan to be  discontinued by May 03, 2003.  Due to the level of toxicity that she  had experienced on the lithium without really altering the  dose, we went  ahead and discontinued the lithium.  We met with the husband who was very  concerned and involved in her treatment.  There was a long history of  events, possible side effects as well as lithium toxicity for a while.  She  endorsed depression more so than hyperthymia.  Has used Zoloft, Effexor,  Lexapro, Lamictal, Depakote and _________.  We discontinued the lithium,  increased the trazodone and increased the Wellbutrin.  Slowly, she started  sleeping, felt better, a little groggy in the morning, anxious, wanting to  get better, feeling fragile and vulnerable, wanting to get his medications  much simplified.  He was experiencing some sounds of ______ that were  amplified.  There were some other problems with perception, hyperaccusive.  In the past, there were episodes where she lost eyesight momentarily and  then regained it back.  We consulted neurology but did not feel that there  was anything other than what has been identified going on.  There were some  nightmares.  As we continued to manipulate the medications, by November  12th, she was much improved.  Some of the underlying symptoms but markedly  improved from admission.  Willing to pursue the medications she was taking.  There were no suicidal ideation, no homicidal ideation, able to sleep.  No  major side effects.  We went ahead and discharged to outpatient follow-up.   DISCHARGE DIAGNOSES:   AXIS I:  Bipolar disorder, depressed.   AXIS II:  No diagnosis.   AXIS III:  1. Status post lithium toxicity.  2. Chronic migraines.  3. Polycystic ovarian disease.  4. Pituitary tumor.  5. Hypertension.  6. Obesity with sleep apnea.   AXIS IV:  Moderate.   AXIS V:  Global Assessment of Functioning upon discharge 55-60.   DISCHARGE MEDICATIONS:  1. Topamax 100 mg in the morning and 200 mg at night.  2. Glucophage 500 mg in the morning and at night. 3. Protonix 40 mg in the morning and at bedtime.  4. Avapro 300  mg at night.  5. Duragesic patch 50 every 72 hours.  6. Wellbutrin XL 300 mg daily.  7. Seroquel 25 mg,  2 at night.  8. Continue to wean off the Keppra.   FOLLOW UP:  Mental health, IOP, Behavioral Health and also an appointment  with Venda Rodes at [facility].                                               Geoffery Lyons, M.D.    IL/MEDQ  D:  05/28/2003  T:  05/28/2003  Job:  [id number]

## 2010-11-05 NOTE — Assessment & Plan Note (Signed)
MEDICAL RECORD NUMBER:  [id number].   Ms. Cornell returns today after I last saw her Nov 11, 2004. A 45 year old  female with severe depression and multiple psychiatric hospitalizations as  well as congenital lumbar stenosis and chronic low back pain. She is  followed up by nurse followup clinic, monthly nurses checks with her  Schedule II prescription pickup.   INTERVAL HISTORY:  Essentially negative other than going back up to 50 of  Duragesic in the interval time. She has taken oxycodone 5 mg a total of 26  tablets in the last 3 months for breakthrough. She continues to see her  psychiatrist who has switched her from tablet to capsule form of Dexedrine.   Her pain level is about 5-1/2 to 6-1/2 out of 10. Pain interference score is  general activity 9, relationship with other people 9, enjoyment of life 7.  Sleep is fair. She does have some shooting pains to the right fourth toe.  She has had surgery with an osteotomy years ago.   She had some problems with meal prep and other household duties but  otherwise is independent with all of her self-care. She does not drive. She  climbs steps. She can walk nonstop for 15 minutes.   REVIEW OF SYSTEMS:  Positive for nausea and  poor appetite.   PHYSICAL EXAMINATION:  Blood pressure 135/98, pulse 62, respirations 16, O2  saturation 93% on room air.   Right fourth digit had some tenderness at the metatarsal head between the  fourth and third digit. She has good pulses, normal skin coloration, normal  skin warmth. She has negative straight leg raising test. Normal deep tendon  reflexes in lower extremities. Normal strength. Normal range of motion. In  her back, she had tenderness minimally at the lumbosacral junction. She had  no pain with forward flexion but pain with extension and lacks about 50%  normal range in the back extensors.   IMPRESSION:  1.  Congenital lumbar stenosis, chronic low back pain.  2.  Morton's neuroma, right lower  extremity.  3.  Depression, chronic.   PLAN:  1.  Will continue current medications, i.e. Duragesic 50 mcg q.72h.  2.  Consider injection for Morton's neuroma, possible alteration of shoe      wear versus padding of metatarsal head.      The patient states it is really not that bad. She just wanted to make      sure it was not related to her back which I assured her it was not.      Erick Colace, M.D.  Electronically Signed     AEK/MedQ  D:  02/10/2005 14:13:28  T:  02/11/2005 09:40:39  Job #:  [id number]   cc:   Milagros Evener, M.D.  [address] [address]  [place], [place] [address]  Fax: [phone]

## 2010-11-05 NOTE — H&P (Signed)
NAME:  Shelley Vasquez, Shelley Vasquez NO.:  [id number]   MEDICAL RECORD NO.:  [id number]                   PATIENT TYPE:  IPS   LOCATION:  0401                                 FACILITY:  [facility]   PHYSICIAN:  Geoffery Lyons, M.D.                   DATE OF BIRTH:  1966-01-23   DATE OF ADMISSION:  04/25/2003  DATE OF DISCHARGE:                         PSYCHIATRIC ADMISSION ASSESSMENT   IDENTIFYING DATA:  This is a voluntary admission.  Identifying information  comes from the patient and accompanying records.  The patient presented for  admission due to suicidal ideation.  She became upset because she has not  seen Dr. Nolen Mu since her discharge from the hospital, April 18, 2003.  She was trying to start with Dr. Milagros Evener but could not get an  appointment prior to May 21, 2003.   Apparently, the patient presented on April 15, 2003 complaining of nausea  and vomiting.  Originally, her nausea and vomiting was attributed to  Lamictal.  However, as this did not improve once Lamictal was discontinued,  she was taken to the hospital.  It was noticed that she was prescribed  Lithobid and, upon checking, her lithium level was noted to be 3.4.  She was  admitted to the hospital and was treated and was also seen in consultation  by Dr. Jeanie Sewer.  Apparently, the patient was advised to continue her  Glucophage for her polycystic ovarian syndrome, to continue tapering her  Keppra in the tapering schedule that had been advised, to continue her  Topamax, Wellbutrin and Hyzaar.  She was to withhold her verapamil until she  returned to see Dr. Cliffton Asters.  She was to hold the lithium over the weekend and  then start at 300 mg p.o. q.d.  She was to return to Dr. Lucilla Lame office this  past Friday to recheck her lithium level and recheck her blood pressure.  Apparently, due to the patient's continuing suicidal ideation and her  concern that she would not be able to remain safe, she  was admitted to  [facility].   PAST PSYCHIATRIC HISTORY:  The patient began being treated for depression in  1992.  This past June, she was diagnosed with bipolar illness.  She has had  three prior admissions to [facility] and admissions to  the, then, psychiatric unit on [place] ago.  The patient has overdosed  before.   SOCIAL HISTORY:  She has completed two years of college.  She has been  married 14 years.  There are no children.  She is not employed although, in  the past, she has been employed as an Environmental health practitioner.   FAMILY HISTORY:  Her father had alcoholism and one manic episode.  He was  here last spring.  His name is [employer].   ALCOHOL/DRUG HISTORY:  She denies any usage.   PRIMARY CARE  PHYSICIAN:  Dr. Laurann Montana.   MEDICAL PROBLEMS:  Polycystic ovarian disease, hypertension, GERD, spinal  stenosis, chronic back pain.  Apparently, Dr. Vear Clock treats her for her  chronic back pain and Dr. Lucianne Muss at [facility] treats her for sleep apnea  and her polycystic ovarian disease.   CURRENT MEDICATIONS:  Restoril 30 mg, 1 q.h.s., Wellbutrin 150 mg, 1 p.o.  q.a.m., Topamax 100 mg in the morning and 200 mg q.h.s., Lithobid 300 mg, 1  in the morning and 1 q.h.s., Keppra (she is being tapered off; she has a  schedule that is written out), Glucophage 500 mg, 1 q.a.m., 1 q.h.s.,  Protonix 40 mg, 1 q.a.m., 1 q.h.s., Benicar 40 mg, 1 q.h.s. and Duragesic 50  mcg per hour (this is a patch and it is applied every 72 hours or something  like that.   ALLERGIES:  NSAIDS (she develops bleeding ulcers).   PHYSICAL EXAMINATION:  GENERAL:  This is an obese white female who claims a  60-pound weight loss in the past year.  LUNGS:  Clear.  HEART:  Regular rate and rhythm without murmurs, rubs, or gallops.  ABDOMEN:  Obese with no palpable organomegaly.  MUSCULOSKELETAL:  No clubbing, cyanosis, or edema.  NEURO:  Cranial nerves 2-12 are  grossly intact.   MENTAL STATUS EXAM:  Alert and oriented x 3.  Appearance and behavior, she  has reasonable grooming although very shabby clothes, holes, etc.  She  states that, due to weight loss, she just has not bought any clothes.  Speech, she has an unusual pitch cadent to her voice.  She is cooperative.  Her mood is depressed.  She does not evoke sympathy initially.  Her thought  process is clear and rational.  She is goal-oriented.  She does not want to  hurt her husband.  Judgment and insight are impaired.  Concentration and  memory are intact but slow.  Her affect is somewhat off-putting.  Seems  confused, slow to answer.  Not true thought-blocking.  Then, as you talk to  her, her judgment and insight are definitely intact.   DIAGNOSES:   AXIS I:  Bipolar, most recent episode depressed with suicidal ideation.   AXIS II:  Rule out personality disorder.   AXIS III:  1. Chronic migraines.  2. Polycystic ovarian disease.  3. Hypertension.  4. Obesity.  5. Sleep apnea.   AXIS IV:  Severe (other psychosocial problems and medical problems).   AXIS V:  Her current Global Assessment of Functioning is about 20.   PLAN:  Admit to provide safety from suicidal ideation.  To stabilize her  through her crisis.  To adjust her medications as indicated.  To help  identify a follow-up psychiatrist.  The patient wants to change from Dr.  Nolen Mu.  Might try to get an appointment at [facility], says she is already  followed there and, actually, the patient might benefit from ECT, which  could be provided at [facility].  A lithium level is pending and we are trying  to get a copy of Dr. Providence Crosby consult.     Mickie Leonarda Salon, P.A.-C.               Geoffery Lyons, M.D.    MD/MEDQ  D:  04/26/2003  T:  04/27/2003  Job:  [id number]

## 2010-11-05 NOTE — Assessment & Plan Note (Signed)
MEDICAL RECORD NUMBER:  [id number].   Ms. Pichette returns after I last saw her August 20, 2004. A 45 year old with  severe depression and multiple psychiatric hospitalizations as well as  congenital lumbar stenosis, chronic low back pain. Interval history is  positive for a fall where she was walking in the dark outside at a friends  and missed a step and fell and rolled on the grass. She had some increase in  back pain. No lower extremity pain. Some neck and upper shoulder pain. This  has bene subsiding. No weakness in the legs. Pain averaging 6/10.   CURRENT MEDICATIONS:  She was switched from Adderall to Abilify. She is on  Lamictal, Cymbalta, Wellbutrin, trazodone, Lidoderm patch, Flexeril 5 mg  t.i.d. for pain, Duragesic 37 mcg change q.72h., Zelnorm, docusate sodium,  and Xanax.   SOCIAL HISTORY:  She is married, disabled for psychiatric reasons June 23, 2002.   REVIEW OF SYSTEMS:  Positive for weakness and numbness in lowers. Some  dizziness. Some confusion. Feels slow. No suicidal thoughts.   PHYSICAL EXAMINATION:  Blood pressure 129/84, pulse 110, respirations 16, O2  saturation 96% on room air. Gait is normal. Affect is bright and alert.  Appearance is normal. She is able to toe walk and heel walk. She has full  strength bilateral upper and lower extremities. Normal deep tendon reflexes  bilateral upper and lower extremities. Normal range bilateral upper and  lower extremities. She has mild tenderness to palpation along the cervical,  thoracic, and lumbar paraspinal muscles.   IMPRESSION:  1.  Congenital spinal stenosis.  2.  Chronic low back pain. She does not have as much radicular pain.  3.  Lumbar facet arthropathy.  4.  History of depression.   PLAN:  Will continue Duragesic 37 mcg dosage, goal in the next month to get  her down to 25, if she gets over the effects of her recent fall. I think  overall this will help her in terms of feeling not as foggy in the head.  Of  course, she is on many other medications that could do the same.      AEK/MedQ  D:  09/16/2004 13:48:45  T:  09/16/2004 14:36:47  Job #:  [id number]

## 2010-11-05 NOTE — Assessment & Plan Note (Signed)
MEDICAL RECORD NUMBER:  [id number].   Date of last visit was September 16, 2004.   A 45 year old female with severe depression and multiple psychiatric  hospitalizations as well as congenital lumbar stenosis and chronic low back  pain. Her interval history is essentially negative. We have been able to  continue her on the Duragesic 37 mcg dosage. Has not had any further falls.  She has had some medication changes from her psychiatrist, i.e. her Cymbalta  has been reduced from 60 mg to 40 mg and is felt to be causing her sweating.  She has been started on Abilify 15 mg at bedtime.   She has also been started on dextroamphetamine 5 mg q.a.m. and at noon.   SOCIAL HISTORY:  Married. Disabled for psychiatric reasons as of June 23, 2002.   REVIEW OF SYSTEMS:  Please see 14-point review. No suicidal thoughts.  Occasional constipation.   PHYSICAL EXAMINATION:  VITAL SIGNS:  Blood pressure 134/70, pulse 99, O2  saturation 98%.  GENERAL:  No acute distress. Mood and affect are appropriate. She is bright  and alert.   Her back had minor tenderness to palpation along the lumbar paraspinals. She  has full range of motion at the knees and hips. Her spine range of motion is  3/4 range forward flexion, 1/4 range extension.   Her deep tendon reflexes are 1 in bilateral lower extremities. She has  negative straight leg raising test. Good strength bilateral lowers. She is  able to toe walk and heel walk.   IMPRESSION:  Degenerative lumbar spinal stenosis. No evidence of  radiculopathy at the current time.   PLAN:  Will reduce to 25 mcg of Duragesic. She does have some oxycodone left  she could use for breakthrough, and we can count up the breakthrough usage  to determine whether or not she is tolerating this dosage or needs to go  back up to the 37. We will continue the Flexeril q.h.s. She can also take it  during the day and also really takes a daytime dosage two or three days a  week. I will  see her back in one month.      AEK/MedQ  D:  10/07/2004 14:18:09  T:  10/07/2004 15:04:54  Job #:  [id number]   cc:   Milagros Evener, M.D.  [address] [address]  [place], [place] [address]  Fax: [phone]   Cliffton Asters, M.D.

## 2010-11-05 NOTE — Consult Note (Signed)
NAME:  Shelley Vasquez, Shelley Vasquez                          ACCOUNT NO.:  [id number]   MEDICAL RECORD NO.:  [id number]                   PATIENT TYPE:  IPS   LOCATION:  0301                                 FACILITY:  [facility]   PHYSICIAN:  Genene Churn. Love, M.D.                 DATE OF BIRTH:  Nov 16, 1965   DATE OF CONSULTATION:  DATE OF DISCHARGE:                                   CONSULTATION   PATIENT IDENTIFICATION:  This 45 year old right handed white married female  is seen at the request of Geoffery Lyons, M.D., for evaluation of new onset  symptoms while hospitalized at [facility]  for depression.   HISTORY OF PRESENT ILLNESS:  Shelley Vasquez has a 12-year history of depression  and states that she was diagnosed in May 2004 as having a bipolar disorder.  Notes in the chart, however, indicate that she has had a long history of  chronic bipolar disorder and has been on lithium therapy.  She had been  formerly taken care of in [place] but most recently has been in [place],  [place].  Her primary care physician is Stacie Acres. Cliffton Asters, M.D., and  her psychiatrist is Andee Poles, M.D.  The patient has been on  longstanding lithium and was admitted by Sherin Quarry, M.D. to [facility] on October 26 through April 18, 2003, for visual disturbances and  recurrent nausea and vomiting and admitted by Stacie Acres. White, M.D. to  [facility] for nausea and vomiting.  At that time, she was found to  have lithium toxicity and was cared for by the hospitalist from [place].  Following discharge on April 17, 2005, she became depressed and was  admitted to [facility] April 25, 2003, with  symptoms of suicidal ideation.  Previously, she has been admitted for  depression and has had suicidal ideation in the past.  In the hospital, she  was placed on Wellbutrin 150 mg p.o. daily, Topamax 100 mg in the morning  and 200 mg at night,  Lithobid 300 mg b.i.d., Keppra 500 mg p.o. in a  tapering schedule over several days, Glucophage 500 mg b.i.d., Protonix 40  mg b.i.d., Benicar 40 mg q.h.s., and a Duragesic patch 50 mcg per hour.  Lithobid was placed on hold subsequent, then discontinued.  In the hospital,  she was started on trazodone 100 mg q.h.s.  She states that she has been on  Topamax for approximately two years and she has also been on Depakote in the  past.  She has a known past history of pituitary microadenoma followed by  Cristi Loron, M.D., polycystic ovarian disease, hypertension, bipolar  disorder, low back pain, migraine headaches, obesity, and gastroesophageal  reflux disease.  Last evening, she heard a sound as if leaves were falling  and somebody was walking over them, crunching leaves.  She also has had the  sensation of her hands feeling tight and that her hands are swollen.  She  had also noted a strange test or metal taste when she was brushing her teeth  and that she normally likes eggs but cannot stand the smell of eggs.  She  has had hyperacousis with noises sounding louder than they normally would.  She denies visual hallucinations or any other auditory hallucinations.  She  has had no other focal neurologic symptoms.   PHYSICAL EXAMINATION:  GENERAL:  Examination revealed a well developed,  obese white female.  VITAL SIGNS:  Blood pressure right and left arms was 120/80, 130/80; heart  rate was 88.  There were no bruits.  She was afebrile.  NEUROLOGIC:  Mental status: She was alert and oriented x 3, followed one,  two, and three-step commands.  She denied visual or auditory hallucinations  during the examination.  She was well dressed and appropriate.  Her cranial  nerves examination showed visual fields to be full.  Both disks were flat.  The extraocular movements were full and corneas were present.  There was no  seventh nerve palsy.  The tongue was midline.  The uvula was midline.   Gags  were present.  Sternocleidomastoid and trapezius testing were normal.  Motor  examination showed good strength in the upper and lower extremities.  Sensory examination revealed poor two point discrimination of the hands but  at times, she was feeling two instead of one so it was not clear that this  was organic.  Deep tendon reflexes were 1+ and plantar responses were  downgoing.  Gait examination was normal.  There was no evidence of a  positive phalanx test or Tinel's sign to indicate carpal tunnel syndrome.   LABORATORY DATA:  White blood cell count 6100, hemoglobin 11.6, hematocrit  34.1, platelet count 442,000.  Sodium 142, potassium 3.7, chloride 113, CO2  content 25, glucose 101, BUN 7, creatinine 0.8.  Total bilirubin 0.5,  alkaline phosphatase 80, SGOT 31, SGPT 46, total protein 6.5, albumin 3.7,  calcium 9.7.  Free T4 1.22, which is normal, thyroid-stimulating hormone  4.772, which is still in the normal range, T3 2.7.  Lithium level was 0.54  on April 26, 2003, and is still being held.  Urinalysis was unremarkable.  Urine drug screen was negative for drugs except for benzodiazepines.   Initial lithium level on April 18, 2003, was 1.66, which was high when she  was at [facility].   IMPRESSION:  1. Auditory hallucinations, 780.1.  2. 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  3. Bipolar disorder with history of psychosis and depression, 296.7.  4. Polycystic ovarian syndrome, code unknown.  5. Pituitary microadenoma, 227.3.  6. Hypertension, 796.2.  7. Obesity, 278.01.  8. Low back pain, 724.4.   At this time, the cause of her auditory hallucinations is unclear.  It could  very well be related to an underlying psychosis.  The hyperacousis would  have a similar mechanism of action.  Her altered sense of smell and taste  could affect her appetite and that can frequently been seen with Topamax as a side effects.  Although her symptoms in the hands sound like carpal tunnel   syndrome, I could not document that today and I note that her thyroid  profile is normal.   PLAN:  The plan at this time is to obtain EEG, MRI, and blood studies to  look for question of seizures of unexplained CNS lesion causing  hallucinations.  It is quite likely that her symptoms may be secondary to  her underlying bipolar disorder.                                               Genene Churn. Sandria Manly, M.D.    JML/MEDQ  D:  04/29/2003  T:  04/30/2003  Job:  [id number]

## 2010-12-13 NOTE — Op Note (Signed)
Shelley Vasquez, INFANTINO NO.:  [id number]  MEDICAL RECORD NO.:  [id number]           PATIENT TYPE:  LOCATION:                                 FACILITY:  PHYSICIAN:  Gerald Leitz, MD          DATE OF BIRTH:  11-08-1965  DATE OF PROCEDURE:  11/02/2010 DATE OF DISCHARGE:                              OPERATIVE REPORT   PREOPERATIVE DIAGNOSES: 1. Menorrhagia. 2. Irregular menses. 3. Uterine fibroids.  PROCEDURE:  Abdominal supracervical hysterectomy and left salpingectomy, aspiration of right ovarian cyst.  SURGEON:  Gerald Leitz, MD  ASSISTANT:  Patsy Baltimore, MD  ANESTHESIA:  Spinal plus an epidural.  COMPLICATIONS:  None.  ESTIMATED BLOOD LOSS:  200 mL.  FLUIDS:  Per anesthesia.  URINE OUTPUT:  600 mL.  FINDINGS:  Multiple uterine fibroids, left hydrosalpinx, 3-4 cm simple appearing cyst on the right ovary.  PROCEDURE:  The patient was taken to the operating room where a spinal and epidural anesthetic were placed.  Anesthesia appeared to be adequate and the patient was prepped and draped in the usual sterile fashion. Foley catheter was placed.  A Pfannenstiel skin incision was made with the scalpel and carried down to the underlying layer of fascia.  The fascia was incised in the midline.  The incision was extended laterally with Mayo scissors.  The inferior aspect of the fascial incision was grasped with Kocher clamps, elevated, and underlying rectus muscles were dissected off.  This was repeated on the superior aspect of the fascial incision.  Rectus muscles were separated in the midline.  Peritoneum was identified and entered sharply.  The incision was extended superiorly inferiorly with good visualization of the bladder.  Balfour retractor was placed in the bowel, was packed away with moist laparotomy sponges. Two Kelly clamps were placed along the cornu and used for retraction. The round ligaments on both sides were suture ligated with 0 Vicryl  and then transected.  The anterior leaf of the broad ligament was incised along the bladder reflection to the midline from both sides.  The bladder was then dissected off the lower uterine segment and the cervix with a sponge stick.  Attention was turned to the left utero-ovarian ligament which was doubly clamped, transected, suture ligated with free tie of 0 Vicryl followed by suture ligature of 0 Vicryl.  This was repeated on the right utero-ovarian ligament.  The uterine arteries were then skeletonized bilaterally, clamped with Heaney clamps, transected, suture ligated with 0 Vicryl.  Due to difficulty with visualization, decision was made to amputate the uterus from the cervix.  This was done with scalpel.  The cervical stump was oversewn with figure-of-eight sutures of 0 Vicryl.  Excellent hemostasis was noted.  Attention was turned to the left adnexa and a left salpingectomy was performed by clamping the mesosalpinx, transecting the left ovary with Metzenbaum scissors and the mesosalpinx was suture ligated with 0 Vicryl.  The patient was noted to have some bleeding from the left ovary.  Hemostasis was obtained with free tie of suture ligature of 2-0 Vicryl.  The abdomen was then copiously  irrigated.  Excellent hemostasis was noted. Avitene was placed along the cervical stump to help with added hemostasis.  Laparotomy sponges and retractor were removed.  The fascia was reapproximated with 0 PDS.  Scarpa fascia was reapproximated with 2- 0 Vicryl.  Skin was closed with 4-0 Vicryl.  Sponge, lap, and needle counts were correct x2.  The patient was taken to the recovery room awake in stable condition.     Gerald Leitz, MD     TC/MEDQ  D:  11/03/2010  T:  11/03/2010  Job:  [id number]  Electronically Signed by Gerald Leitz MD on 12/13/2010 01:03:15 PM

## 2010-12-13 NOTE — Discharge Summary (Signed)
  Shelley Vasquez, Shelley Vasquez                ACCOUNT NO.:  [id number]  MEDICAL RECORD NO.:  [id number]           PATIENT TYPE:  I  LOCATION:  9310                          FACILITY:  [facility]  PHYSICIAN:  Gerald Leitz, MD          DATE OF BIRTH:  Apr 17, 1966  DATE OF ADMISSION:  11/02/2010 DATE OF DISCHARGE:  11/04/2010                              DISCHARGE SUMMARY   ADMISSION DIAGNOSES: 1. Menorrhagia. 2. Uterine fibroids. 3. Irregular menses.  DISCHARGE DIAGNOSES: 1. Menorrhagia. 2. Uterine fibroids. 3. Irregular menses.  BRIEF HOSPITAL COURSE:  The patient was admitted on Nov 02, 2010 after undergoing abdominal supracervical hysterectomy and left salpingectomy for menorrhagia and uterine fibroids.  The surgery was performed under spinal epidural anesthesia.  She did well postoperatively.  Hemoglobin on postop day #1 was 12.7.  She was discharged home in stable and improved condition on the following medications.  She is to resume home medications as well as Percocet.  She will follow up in the office in 2 weeks for incision check.     Gerald Leitz, MD     TC/MEDQ  D:  11/04/2010  T:  11/04/2010  Job:  [id number]  Electronically Signed by Gerald Leitz MD on 12/13/2010 01:03:17 PM

## 2010-12-14 ENCOUNTER — Other Ambulatory Visit (HOSPITAL_COMMUNITY): Payer: Self-pay | Admitting: Neurological Surgery

## 2010-12-14 DIAGNOSIS — M48061 Spinal stenosis, lumbar region without neurogenic claudication: Secondary | ICD-10-CM

## 2010-12-27 ENCOUNTER — Ambulatory Visit (HOSPITAL_COMMUNITY)
Admission: RE | Admit: 2010-12-27 | Discharge: 2010-12-27 | Disposition: A | Discharge status: Home / Self Care | Payer: Medicare Other | Source: Ambulatory Visit | Attending: Neurological Surgery | Admitting: Neurological Surgery

## 2010-12-27 DIAGNOSIS — M48061 Spinal stenosis, lumbar region without neurogenic claudication: Secondary | ICD-10-CM

## 2010-12-27 DIAGNOSIS — M549 Dorsalgia, unspecified: Secondary | ICD-10-CM | POA: Insufficient documentation

## 2010-12-27 DIAGNOSIS — M79609 Pain in unspecified limb: Secondary | ICD-10-CM | POA: Insufficient documentation

## 2010-12-27 MED ORDER — IOHEXOL 180 MG/ML  SOLN
20.0000 mL | Freq: Once | INTRAMUSCULAR | Status: AC | PRN
  Administered 2010-12-27: 20 mL via INTRATHECAL

## 2011-02-02 ENCOUNTER — Encounter (HOSPITAL_COMMUNITY)
Admission: RE | Admit: 2011-02-02 | Discharge: 2011-02-02 | Disposition: A | Discharge status: Home / Self Care | Payer: Medicare Other | Source: Ambulatory Visit | Attending: Neurological Surgery | Admitting: Neurological Surgery

## 2011-02-02 ENCOUNTER — Other Ambulatory Visit (HOSPITAL_COMMUNITY): Payer: Self-pay | Admitting: Neurological Surgery

## 2011-02-02 DIAGNOSIS — M47816 Spondylosis without myelopathy or radiculopathy, lumbar region: Secondary | ICD-10-CM

## 2011-02-02 LAB — CBC
Hemoglobin: 13.1 g/dL (ref 12.0–15.0)
Platelets: 279 10*3/uL (ref 150–400)
RBC: 4.11 MIL/uL (ref 3.87–5.11)

## 2011-02-02 LAB — COMPREHENSIVE METABOLIC PANEL
ALT: 9 U/L (ref 0–35)
AST: 15 U/L (ref 0–37)
Alkaline Phosphatase: 56 U/L (ref 39–117)
CO2: 32 mEq/L (ref 19–32)
GFR calc Af Amer: >60 mL/min (ref >60)
GFR calc non Af Amer: >60 mL/min (ref >60)
Glucose, Bld: 106 mg/dL — ABNORMAL HIGH (ref 70–99)
Potassium: 4 mEq/L (ref 3.5–5.1)
Sodium: 138 mEq/L (ref 135–145)
Total Protein: 6.8 g/dL (ref 6.0–8.3)

## 2011-02-14 ENCOUNTER — Inpatient Hospital Stay (HOSPITAL_COMMUNITY): Payer: Medicare Other

## 2011-02-14 ENCOUNTER — Inpatient Hospital Stay (HOSPITAL_COMMUNITY)
Admission: RE | Admit: 2011-02-14 | Discharge: 2011-02-17 | DRG: 460 | Disposition: A | Discharge status: Home Health | Payer: Medicare Other | Source: Ambulatory Visit | Attending: Neurological Surgery | Admitting: Neurological Surgery

## 2011-02-14 DIAGNOSIS — M48061 Spinal stenosis, lumbar region without neurogenic claudication: Secondary | ICD-10-CM | POA: Diagnosis present

## 2011-02-14 DIAGNOSIS — M4310 Spondylolisthesis, site unspecified: Secondary | ICD-10-CM | POA: Diagnosis present

## 2011-02-14 DIAGNOSIS — E669 Obesity, unspecified: Secondary | ICD-10-CM | POA: Diagnosis present

## 2011-02-14 DIAGNOSIS — M47817 Spondylosis without myelopathy or radiculopathy, lumbosacral region: Secondary | ICD-10-CM | POA: Diagnosis present

## 2011-02-14 DIAGNOSIS — Z01812 Encounter for preprocedural laboratory examination: Secondary | ICD-10-CM

## 2011-02-14 DIAGNOSIS — Z888 Allergy status to other drugs, medicaments and biological substances status: Secondary | ICD-10-CM

## 2011-02-14 DIAGNOSIS — I10 Essential (primary) hypertension: Secondary | ICD-10-CM | POA: Diagnosis present

## 2011-02-14 DIAGNOSIS — Z531 Procedure and treatment not carried out because of patient's decision for reasons of belief and group pressure: Secondary | ICD-10-CM

## 2011-02-14 DIAGNOSIS — G4733 Obstructive sleep apnea (adult) (pediatric): Secondary | ICD-10-CM | POA: Diagnosis present

## 2011-02-14 DIAGNOSIS — M5126 Other intervertebral disc displacement, lumbar region: Principal | ICD-10-CM | POA: Diagnosis present

## 2011-02-15 LAB — CBC
HCT: 33.4 % — ABNORMAL LOW (ref 36.0–46.0)
MCH: 30.8 pg (ref 26.0–34.0)
MCV: 89.5 fL (ref 78.0–100.0)
Platelets: 266 10*3/uL (ref 150–400)
RBC: 3.73 MIL/uL — ABNORMAL LOW (ref 3.87–5.11)

## 2011-02-15 LAB — BASIC METABOLIC PANEL
BUN: 7 mg/dL (ref 6–23)
CO2: 30 mEq/L (ref 19–32)
Calcium: 8.9 mg/dL (ref 8.4–10.5)
Chloride: 101 mEq/L (ref 96–112)
Creatinine, Ser: 0.65 mg/dL (ref 0.50–1.10)
Glucose, Bld: 134 mg/dL — ABNORMAL HIGH (ref 70–99)

## 2011-02-16 LAB — DIFFERENTIAL
Basophils Relative: 0 % (ref 0–1)
Eosinophils Absolute: 0.9 10*3/uL — ABNORMAL HIGH (ref 0.0–0.7)
Eosinophils Relative: 10 % — ABNORMAL HIGH (ref 0–5)
Lymphs Abs: 1.8 10*3/uL (ref 0.7–4.0)
Monocytes Absolute: 0.6 10*3/uL (ref 0.1–1.0)
Monocytes Relative: 7 % (ref 3–12)
Neutrophils Relative %: 64 % (ref 43–77)

## 2011-02-16 LAB — COMPREHENSIVE METABOLIC PANEL
ALT: 9 U/L (ref 0–35)
AST: 23 U/L (ref 0–37)
CO2: 30 mEq/L (ref 19–32)
Calcium: 9.3 mg/dL (ref 8.4–10.5)
Creatinine, Ser: 0.61 mg/dL (ref 0.50–1.10)
GFR calc non Af Amer: >60 mL/min (ref >60)
Sodium: 137 mEq/L (ref 135–145)
Total Protein: 6.1 g/dL (ref 6.0–8.3)

## 2011-02-16 LAB — CBC
MCH: 31.1 pg (ref 26.0–34.0)
MCHC: 34.5 g/dL (ref 30.0–36.0)
MCV: 89.9 fL (ref 78.0–100.0)
Platelets: 269 10*3/uL (ref 150–400)
RDW: 12.5 % (ref 11.5–15.5)

## 2011-02-24 NOTE — Op Note (Signed)
Shelley, Vasquez NO.:  [id number]  MEDICAL RECORD NO.:  [id number]  LOCATION:  3012                         FACILITY:  [facility]  PHYSICIAN:  Stefani Dama, M.D.  DATE OF BIRTH:  1965-11-28  DATE OF PROCEDURE:  02/14/2011 DATE OF DISCHARGE:                              OPERATIVE REPORT   PREOPERATIVE DIAGNOSIS:  Herniated nucleus pulposus L4-L5 with spondylosis and bilateral lumbar radiculopathy L4-L5, spinal stenosis L4- L5.  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  PROCEDURE:  Bilateral laminotomies, decompression of L4 and L5 nerve roots, total diskectomy with posterior lumbar interbody arthrodesis using PEEK spacers, posterolateral arthrodesis using local autograft and allograft and second pedicle screw fixation L4-L5.  SURGEON:  Stefani Dama, MD  FIRST ASSISTANT:  Clydene Fake, MD  ANESTHESIA:  General endotracheal.  INDICATIONS:  Shelley Vasquez is a 45 year old individual who has had significant problems with back pain and radiculopathy.  She has evidence of severe spondylitic disease at L4-L5 with a broad-based disk herniation and severe central stenosis and lateral recess stenosis at the L4-L5 level.  She has been treated conservatively, but is refractory to this and has become increasingly dependent on strong doses of narcotic pain medication.  She has now been advised regarding need for surgical decompression and stabilization of the L4-L5 joint.  There is early formation of grade 1 spondylolisthesis noted at the L4-L5 level also.  PROCEDURE IN DETAIL:  The patient was brought to the operating room supine on the stretcher.  Smooth induction of general endotracheal anesthesia was undertaken.  The patient was carefully turned prone on vertical towel rolls and the back was appropriately positioned, was prepped with alcohol and  DuraPrep, draped in a sterile fashion and a midline incision was created in the lower lumbar spine after localizing the appropriate area on a radiograph.  A subperiosteal dissection was performed in the interlaminar space at L4-L5 and self-retaining retractor was placed in the wound.  After further dissection, we were able to identify superiorly the L4 transverse process, inferiorly the L5 transverse process and the L4-L5 __________ .  Laminotomy was then undertaken using a high-speed drill and a small bur to remove the inferior margin of the lamina of L4 out the medial wall of the facet, ultimately performing a complete facetectomy.  The yellow ligament was taken up and the underlying common dural tube was explored and on the left side, decompression of the L5 nerve root was undertaken as it was rather snug in the region of the foramen.  More proximally and superiorly, the disk herniation subligamentous space was identified, compressing the L5 nerve root as it entered the foramen.  The common dural tube was carefully retracted.  Disk space was incised and several fragments of the disk material were removed from the subligamentous space, thus relieving some of the pressure on the common dural tube itself.  Further dissection yielded other fragment of the disk.  The L4- L5 disk space was then evacuated completely in a piecemeal fashion first starting on the right side at L4-L5 and then working medially and decompressing from the inside  L4-L5 out to the left side on the opposite side.  A series of disk shavers were used to appropriately decorticate the endplates and a toothed curette was used to further decorticate the endplates both superiorly and inferiorly at L4-L5.  Once all the disk material was removed, a series of trial spacers were used and ultimately, it was felt that a 12-mm spacer would fit best into the interspace.  The 12-mm spacer was filled with a combination of Vitoss bone  sponge soaked with PureGen stem cell preparation and this was then placed into the interspace at L4-L5 first on the right side.  The interspace was then packed with similar material and the left-sided spacer was then placed.  Care was taken to make sure that the L4 and the L5 nerve roots were well decompressed and had no traction or torsion and then pedicle entry sites were chosen at L4-L5, then using fluoroscopic guidance, 6.5 x 45-mm pedicle screws were placed in L4 and L5, each screw being tapped individually sounded to make sure there was no evidence of cutout and then placed under fluoroscopic guidance.  With this then, the precontoured 35-mm rods were used to connect the screw heads together and these were done in the neutral construct.  Again, care was taken to evaluate the L4 and the L5 nerve roots to make sure that they were well decompressed and no debris had fallen on it. Lateral gutters were then packed with a combination of the remaining autograft and after the graft packing was performed, the wound was checked for hemostasis and the lumbodorsal fascia was closed with #1 Vicryl in interrupted fashion, 2-0 Vicryl was used in subcutaneous tissues, 3-0 Vicryl subcuticularly.  Dermabond was placed on the skin along with a dry gauze.  Blood loss for the procedure was estimated at 300 mL.  Cell Saver blood was returned to the patient.  The patient tolerated the procedure well and was returned to the recovery room in stable condition.     Stefani Dama, M.D.     [name]  D:  02/14/2011  T:  02/14/2011  Job:  [id number]  Electronically Signed by Barnett Abu M.D. on 02/24/2011 03:17:29 PM

## 2011-04-20 NOTE — Discharge Summary (Signed)
Shelley Vasquez, Shelley Vasquez NO.:  [id number]  MEDICAL RECORD NO.:  [id number]  LOCATION:                                 FACILITY:  PHYSICIAN:  Stefani Dama, M.D.  DATE OF BIRTH:  April 09, 1966  DATE OF ADMISSION:  02/14/2011 DATE OF DISCHARGE:  02/17/2011                              DISCHARGE SUMMARY   ADMITTING DIAGNOSES:  Herniated nucleus pulposus L4-5 with spondylosis and bilateral lumbar radiculopathy L4-5 with spinal stenosis L4-5.  DISCHARGE DIAGNOSES:  Herniated nucleus pulposus L4-5 with spondylosis and bilateral lumbar radiculopathy L4-5 with spinal stenosis L4-5.  OPERATIONS AND PROCEDURES:  Bilateral laminotomies decompression L4-5 nerve roots, total diskectomy, posterior lumbar interbody arthrodesis, posterolateral arthrodesis and pedicle screw fixation L4-5.  BRIEF HISTORY AND HOSPITAL COURSE:  The patient is a 45 year old female who has had significant problems with back pain and bilateral lower extremity radiculopathy with chronic pain.  She has failed conservative care, evidence of severe spondylitic disease L4-5 progressed disk herniation, severe central stenosis and lateral recess stenosis of the L4-5 level.  She has been refractory to treatment and has been dependent on strong doses of narcotic pain medication.  She has been advised to undergo posterior spinal decompression and fusion L4-5 joint and she also has an grade 1 degenerative spondylolisthesis at this level.  The patient underwent surgery on February 14, 2011, tolerated this procedure well, stabilized to recovery room, placed on the floor, placed on PCA pump for pain control.  First day postoperatively, the patient was neurologically intact.  Vital signs stable, afebrile.  She was weaned off of her PCA to p.o. pain medications.  She was continued on her home regimen, started with physical therapy and occupational therapy.  Foley catheter was discontinued.  Second day postoperatively  was making good progress, ambulating safely with therapy, learning her back precautions and arrangements were made for discharge home on February 17, 2011.  She was eating well, voiding well.  Vital signs were stable and afebrile. Wound was dry.  She neurologically intact.  No new focal deficits, ready for discharge home February 17, 2011.  DISCHARGE CONDITION:  Stable and improved.  DISCHARGE INSTRUCTIONS:  Discharged home.  Given prescription for Valium, Percocet, Flexeril; work with physical therapy, occupational therapy prior to discharge home.  Discontinue her dressing and okay to shower with assistance prior to discharge home.  Home health physical therapy three in one rolling walker for home use.  Follow up in 3-4 weeks with Dr. Danielle Dess in the office as an outpatient.  Contact our office prior to followup if there are any question and concerns.  DISCHARGE MEDICATIONS: 1. Maxalt 10 mg p.o. daily. 2. Protonix 40 mg p.o. b.i.d. 3. Percocet 5/325 one p.o. q.4 h. p.r.n. pain. 4. Multivitamins over-the-counter 1 p.o. daily. 5. MiraLax p.o. daily. 6. Metoprolol XL 100 mg p.o. q.a.m. 7. Maxzide 37.5/25 one p.o. q.a.m. 8. Lamotrigine 200 mg 2 tablets p.o. nightly p.r.n.  DISCHARGE PRESCRIPTIONS: 1. Valium 5 mg 1 p.o. q.8-12 h. p.r.n. spasm. 2. Flexeril 10 mg 1 p.o. q.8-12 h. p.r.n. spasm. 3. Percocet 5/325 one to two p.o. q.4 h. p.r.n. pain.     [name], P.A.  ______________________________ Stefani Dama, M.D.    SCI/MEDQ  D:  03/02/2011  T:  03/02/2011  Job:  [id number]  Electronically Signed by Orlin Hilding P.A. on 03/14/2011 11:44:55 AM Electronically Signed by Barnett Abu M.D. on 04/20/2011 06:52:03 AM

## 2011-05-30 ENCOUNTER — Ambulatory Visit: Payer: Medicare Other | Attending: Neurological Surgery | Admitting: Physical Therapy

## 2011-05-30 DIAGNOSIS — M2569 Stiffness of other specified joint, not elsewhere classified: Secondary | ICD-10-CM | POA: Insufficient documentation

## 2011-05-30 DIAGNOSIS — M549 Dorsalgia, unspecified: Secondary | ICD-10-CM | POA: Insufficient documentation

## 2011-05-30 DIAGNOSIS — IMO0001 Reserved for inherently not codable concepts without codable children: Secondary | ICD-10-CM | POA: Insufficient documentation

## 2011-06-02 ENCOUNTER — Ambulatory Visit: Payer: Medicare Other | Admitting: Physical Therapy

## 2011-06-07 ENCOUNTER — Ambulatory Visit: Payer: Medicare Other | Admitting: Physical Therapy

## 2011-06-08 ENCOUNTER — Encounter: Payer: Medicare Other | Admitting: Physical Therapy

## 2011-06-16 ENCOUNTER — Ambulatory Visit: Payer: Medicare Other | Admitting: Physical Therapy

## 2011-06-22 ENCOUNTER — Ambulatory Visit: Payer: Medicare Other | Attending: Neurological Surgery | Admitting: Physical Therapy

## 2011-06-22 DIAGNOSIS — IMO0001 Reserved for inherently not codable concepts without codable children: Secondary | ICD-10-CM | POA: Insufficient documentation

## 2011-06-22 DIAGNOSIS — M549 Dorsalgia, unspecified: Secondary | ICD-10-CM | POA: Insufficient documentation

## 2011-06-22 DIAGNOSIS — M2569 Stiffness of other specified joint, not elsewhere classified: Secondary | ICD-10-CM | POA: Insufficient documentation

## 2011-06-27 ENCOUNTER — Encounter: Payer: Medicare Other | Admitting: Physical Therapy

## 2011-06-29 ENCOUNTER — Encounter: Payer: Medicare Other | Admitting: Physical Therapy

## 2011-07-05 ENCOUNTER — Ambulatory Visit: Payer: Medicare Other | Admitting: Physical Therapy

## 2011-07-07 ENCOUNTER — Ambulatory Visit: Payer: Medicare Other | Admitting: Physical Therapy

## 2011-07-12 ENCOUNTER — Encounter: Payer: Medicare Other | Admitting: Physical Therapy

## 2011-07-14 ENCOUNTER — Encounter: Payer: Medicare Other | Admitting: Physical Therapy

## 2011-07-19 ENCOUNTER — Encounter: Payer: Medicare Other | Admitting: Physical Therapy

## 2011-07-21 ENCOUNTER — Encounter: Payer: Medicare Other | Admitting: Physical Therapy

## 2011-07-26 ENCOUNTER — Encounter: Payer: Medicare Other | Admitting: Physical Therapy

## 2011-07-28 ENCOUNTER — Encounter: Payer: Medicare Other | Admitting: Physical Therapy

## 2011-08-02 ENCOUNTER — Encounter: Payer: Medicare Other | Admitting: Physical Therapy

## 2011-08-04 ENCOUNTER — Encounter: Payer: Medicare Other | Admitting: Physical Therapy

## 2012-05-19 IMAGING — RF DG LUMBAR SPINE 2-3V
1 series · 2 of 2 positions shown · non-contrast
Comparison: CT myelogram 12/27/2010.

CLINICAL DATA: Back pain.

LUMBAR SPINE - 2-3 VIEW

[Series 1: run · 2 of 2 slices shown]
[im 1/2]
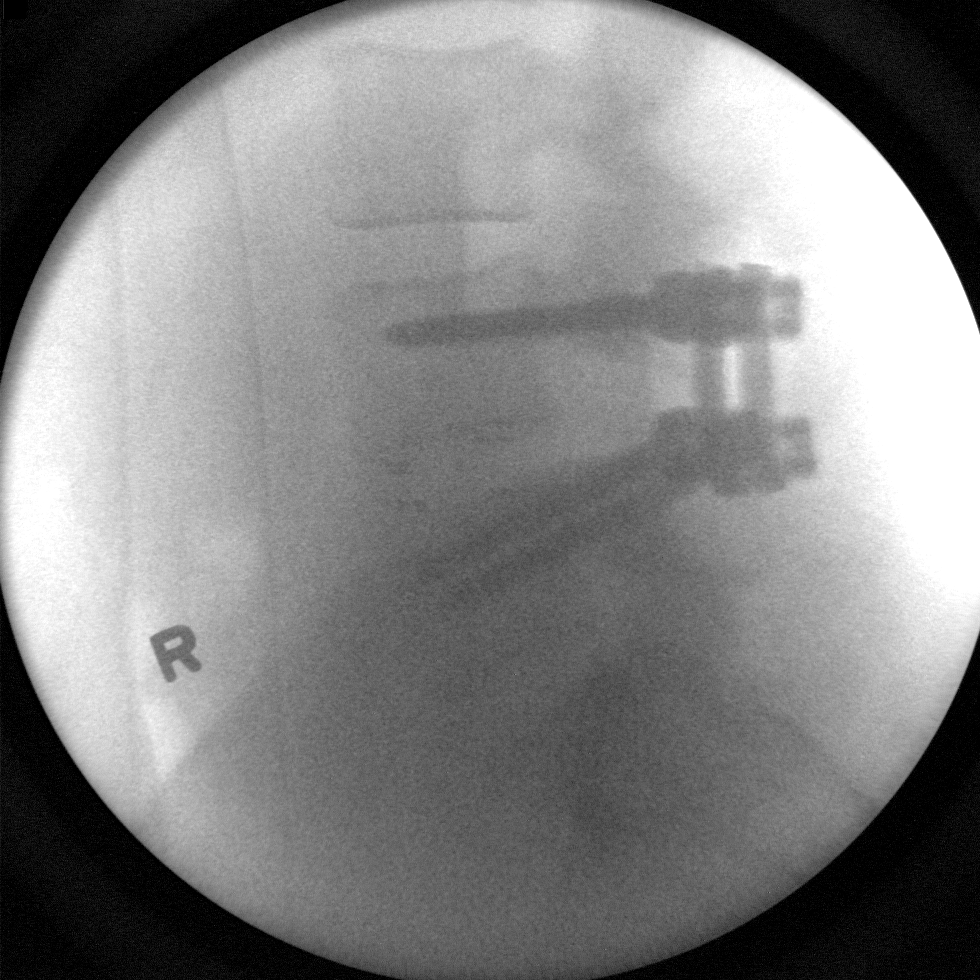
[im 2/2]
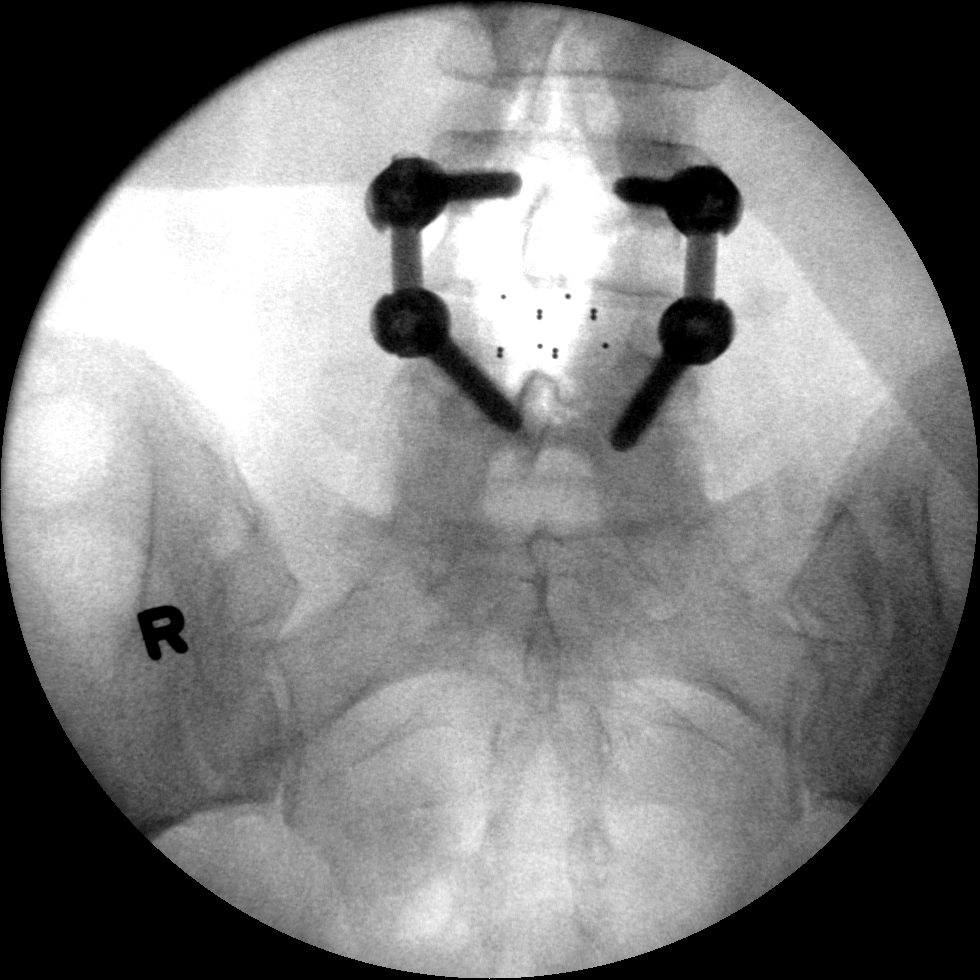

[2 of 2 positions shown; findings below may reference images not displayed]

FINDINGS: C-arm films document L4-5 posterolateral and interbody
fusion.  Pedicle screws and rods appear appropriately placed.
IMPRESSION: As above.

## 2012-05-19 IMAGING — CR DG LUMBAR SPINE 2-3V
1 series · 1 of 1 positions shown · non-contrast
Comparison: None.

CLINICAL DATA: Low back pain

LUMBAR SPINE - 2-3 VIEW

[view not recorded]
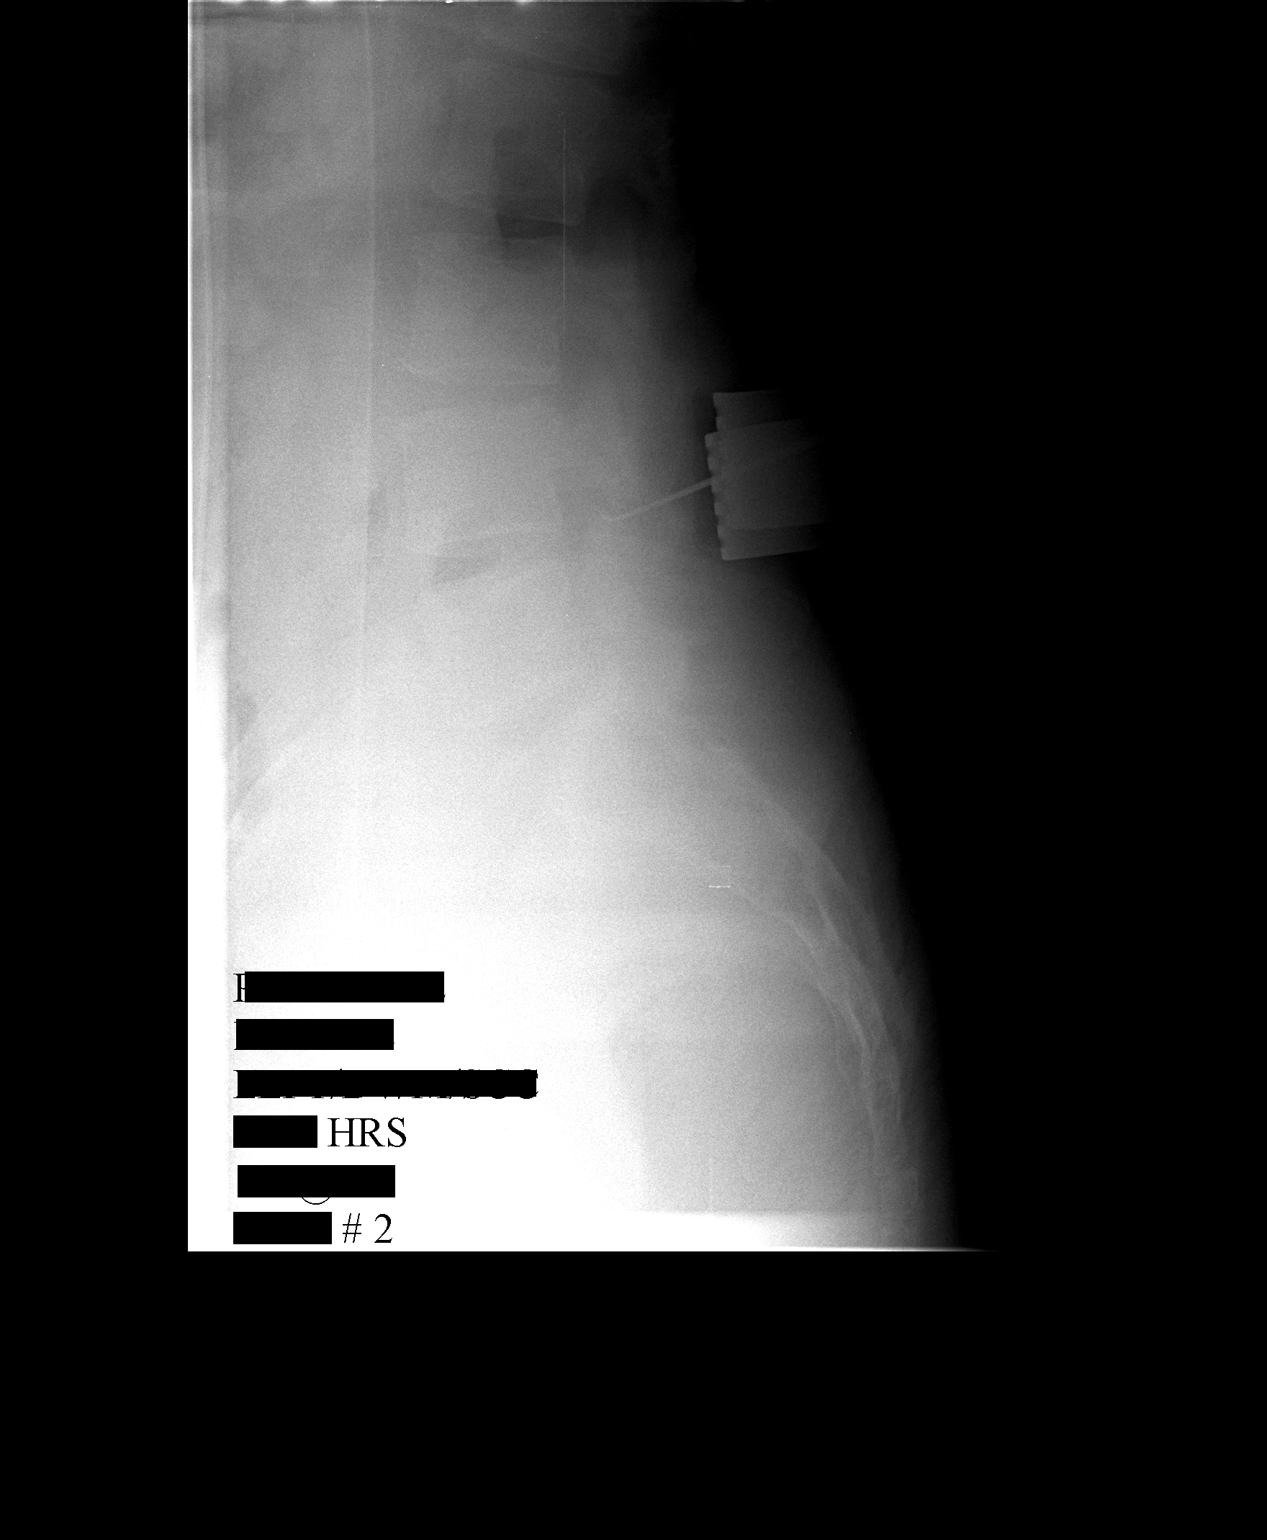

[1 of 1 positions shown; findings below may reference images not displayed]

FINDINGS: Intraoperative lateral film #1 demonstrates two
instruments from a posterior approach.  The upper instrument is
directed upward most closely toward the L4-L5 interspace.  The
lower instrument  is directed downward most closely toward the L5-
S1 interspace.
IMPRESSION: As above.

## 2012-11-29 ENCOUNTER — Other Ambulatory Visit (HOSPITAL_COMMUNITY)
Admission: RE | Admit: 2012-11-29 | Discharge: 2012-11-29 | Disposition: A | Discharge status: Home / Self Care | Payer: Medicare Other | Source: Ambulatory Visit | Attending: Family Medicine | Admitting: Family Medicine

## 2012-11-29 ENCOUNTER — Other Ambulatory Visit: Payer: Self-pay | Admitting: Family Medicine

## 2012-11-29 DIAGNOSIS — Z124 Encounter for screening for malignant neoplasm of cervix: Secondary | ICD-10-CM | POA: Insufficient documentation

## 2013-12-09 ENCOUNTER — Ambulatory Visit: Payer: Medicare PPO | Attending: Family Medicine | Admitting: Physical Therapy

## 2013-12-09 DIAGNOSIS — M545 Low back pain, unspecified: Secondary | ICD-10-CM | POA: Insufficient documentation

## 2013-12-09 DIAGNOSIS — IMO0001 Reserved for inherently not codable concepts without codable children: Secondary | ICD-10-CM | POA: Insufficient documentation

## 2013-12-11 ENCOUNTER — Ambulatory Visit: Payer: Medicare PPO | Admitting: Physical Therapy

## 2013-12-16 ENCOUNTER — Ambulatory Visit: Payer: Medicare PPO | Admitting: Physical Therapy

## 2013-12-18 ENCOUNTER — Ambulatory Visit: Payer: Medicare PPO | Attending: Family Medicine | Admitting: Physical Therapy

## 2013-12-18 DIAGNOSIS — IMO0001 Reserved for inherently not codable concepts without codable children: Secondary | ICD-10-CM | POA: Insufficient documentation

## 2013-12-18 DIAGNOSIS — M545 Low back pain, unspecified: Secondary | ICD-10-CM | POA: Diagnosis not present

## 2013-12-23 ENCOUNTER — Ambulatory Visit: Payer: Medicare PPO | Admitting: Physical Therapy

## 2013-12-23 DIAGNOSIS — IMO0001 Reserved for inherently not codable concepts without codable children: Secondary | ICD-10-CM | POA: Diagnosis not present

## 2013-12-25 ENCOUNTER — Ambulatory Visit: Payer: Medicare PPO | Admitting: Physical Therapy

## 2013-12-25 DIAGNOSIS — IMO0001 Reserved for inherently not codable concepts without codable children: Secondary | ICD-10-CM | POA: Diagnosis not present

## 2013-12-30 ENCOUNTER — Ambulatory Visit: Payer: Medicare PPO | Admitting: Physical Therapy

## 2013-12-30 DIAGNOSIS — IMO0001 Reserved for inherently not codable concepts without codable children: Secondary | ICD-10-CM | POA: Diagnosis not present

## 2014-01-01 ENCOUNTER — Ambulatory Visit: Payer: Medicare PPO | Admitting: Physical Therapy

## 2014-01-01 DIAGNOSIS — IMO0001 Reserved for inherently not codable concepts without codable children: Secondary | ICD-10-CM | POA: Diagnosis not present

## 2014-01-06 ENCOUNTER — Ambulatory Visit: Payer: Medicare PPO | Admitting: Physical Therapy

## 2014-01-13 ENCOUNTER — Ambulatory Visit: Payer: Medicare PPO | Admitting: Physical Therapy

## 2014-01-13 DIAGNOSIS — IMO0001 Reserved for inherently not codable concepts without codable children: Secondary | ICD-10-CM | POA: Diagnosis not present

## 2014-01-15 ENCOUNTER — Ambulatory Visit: Payer: Medicare PPO

## 2014-01-20 ENCOUNTER — Ambulatory Visit: Payer: Medicare PPO | Attending: Family Medicine

## 2014-01-20 DIAGNOSIS — M545 Low back pain, unspecified: Secondary | ICD-10-CM | POA: Diagnosis not present

## 2014-01-20 DIAGNOSIS — IMO0001 Reserved for inherently not codable concepts without codable children: Secondary | ICD-10-CM | POA: Diagnosis not present

## 2014-03-27 ENCOUNTER — Ambulatory Visit
Admission: RE | Admit: 2014-03-27 | Discharge: 2014-03-27 | Disposition: A | Discharge status: Home / Self Care | Payer: Commercial Managed Care - HMO | Source: Ambulatory Visit | Attending: Family Medicine | Admitting: Family Medicine

## 2014-03-27 ENCOUNTER — Other Ambulatory Visit: Payer: Self-pay | Admitting: Family Medicine

## 2014-03-27 DIAGNOSIS — R079 Chest pain, unspecified: Secondary | ICD-10-CM

## 2015-06-24 DIAGNOSIS — M25561 Pain in right knee: Secondary | ICD-10-CM | POA: Diagnosis not present

## 2015-07-02 DIAGNOSIS — N62 Hypertrophy of breast: Secondary | ICD-10-CM | POA: Diagnosis not present

## 2015-07-02 DIAGNOSIS — M5136 Other intervertebral disc degeneration, lumbar region: Secondary | ICD-10-CM | POA: Diagnosis not present

## 2015-07-02 DIAGNOSIS — F319 Bipolar disorder, unspecified: Secondary | ICD-10-CM | POA: Diagnosis not present

## 2015-07-02 DIAGNOSIS — M25562 Pain in left knee: Secondary | ICD-10-CM | POA: Diagnosis not present

## 2015-07-02 DIAGNOSIS — G894 Chronic pain syndrome: Secondary | ICD-10-CM | POA: Diagnosis not present

## 2015-07-02 DIAGNOSIS — M25561 Pain in right knee: Secondary | ICD-10-CM | POA: Diagnosis not present

## 2015-07-13 DIAGNOSIS — F4321 Adjustment disorder with depressed mood: Secondary | ICD-10-CM | POA: Diagnosis not present

## 2015-07-22 DIAGNOSIS — M25562 Pain in left knee: Secondary | ICD-10-CM | POA: Diagnosis not present

## 2015-07-22 DIAGNOSIS — F4321 Adjustment disorder with depressed mood: Secondary | ICD-10-CM | POA: Diagnosis not present

## 2015-07-22 DIAGNOSIS — M25561 Pain in right knee: Secondary | ICD-10-CM | POA: Diagnosis not present

## 2015-07-28 DIAGNOSIS — F4321 Adjustment disorder with depressed mood: Secondary | ICD-10-CM | POA: Diagnosis not present

## 2015-07-30 DIAGNOSIS — F3131 Bipolar disorder, current episode depressed, mild: Secondary | ICD-10-CM | POA: Diagnosis not present

## 2015-08-03 DIAGNOSIS — M5136 Other intervertebral disc degeneration, lumbar region: Secondary | ICD-10-CM | POA: Diagnosis not present

## 2015-08-03 DIAGNOSIS — Z79899 Other long term (current) drug therapy: Secondary | ICD-10-CM | POA: Diagnosis not present

## 2015-08-03 DIAGNOSIS — M25562 Pain in left knee: Secondary | ICD-10-CM | POA: Diagnosis not present

## 2015-08-03 DIAGNOSIS — M25561 Pain in right knee: Secondary | ICD-10-CM | POA: Diagnosis not present

## 2015-08-03 DIAGNOSIS — F3130 Bipolar disorder, current episode depressed, mild or moderate severity, unspecified: Secondary | ICD-10-CM | POA: Diagnosis not present

## 2015-08-03 DIAGNOSIS — G894 Chronic pain syndrome: Secondary | ICD-10-CM | POA: Diagnosis not present

## 2015-08-06 DIAGNOSIS — F4321 Adjustment disorder with depressed mood: Secondary | ICD-10-CM | POA: Diagnosis not present

## 2015-08-13 DIAGNOSIS — F4321 Adjustment disorder with depressed mood: Secondary | ICD-10-CM | POA: Diagnosis not present

## 2015-08-27 DIAGNOSIS — F4321 Adjustment disorder with depressed mood: Secondary | ICD-10-CM | POA: Diagnosis not present

## 2015-08-31 DIAGNOSIS — M5136 Other intervertebral disc degeneration, lumbar region: Secondary | ICD-10-CM | POA: Diagnosis not present

## 2015-08-31 DIAGNOSIS — R609 Edema, unspecified: Secondary | ICD-10-CM | POA: Diagnosis not present

## 2015-08-31 DIAGNOSIS — R5383 Other fatigue: Secondary | ICD-10-CM | POA: Diagnosis not present

## 2015-08-31 DIAGNOSIS — G894 Chronic pain syndrome: Secondary | ICD-10-CM | POA: Diagnosis not present

## 2015-08-31 DIAGNOSIS — F3130 Bipolar disorder, current episode depressed, mild or moderate severity, unspecified: Secondary | ICD-10-CM | POA: Diagnosis not present

## 2015-09-18 DIAGNOSIS — F3181 Bipolar II disorder: Secondary | ICD-10-CM | POA: Diagnosis not present

## 2015-09-23 DIAGNOSIS — F4321 Adjustment disorder with depressed mood: Secondary | ICD-10-CM | POA: Diagnosis not present

## 2015-09-24 DIAGNOSIS — I10 Essential (primary) hypertension: Secondary | ICD-10-CM | POA: Diagnosis not present

## 2015-09-24 DIAGNOSIS — R0600 Dyspnea, unspecified: Secondary | ICD-10-CM | POA: Diagnosis not present

## 2015-09-24 DIAGNOSIS — M519 Unspecified thoracic, thoracolumbar and lumbosacral intervertebral disc disorder: Secondary | ICD-10-CM | POA: Diagnosis not present

## 2015-09-24 DIAGNOSIS — R110 Nausea: Secondary | ICD-10-CM | POA: Diagnosis not present

## 2015-09-24 DIAGNOSIS — R42 Dizziness and giddiness: Secondary | ICD-10-CM | POA: Diagnosis not present

## 2015-09-24 DIAGNOSIS — J302 Other seasonal allergic rhinitis: Secondary | ICD-10-CM | POA: Diagnosis not present

## 2015-10-26 DIAGNOSIS — K219 Gastro-esophageal reflux disease without esophagitis: Secondary | ICD-10-CM | POA: Diagnosis not present

## 2015-10-26 DIAGNOSIS — R1310 Dysphagia, unspecified: Secondary | ICD-10-CM | POA: Diagnosis not present

## 2015-10-26 DIAGNOSIS — M25562 Pain in left knee: Secondary | ICD-10-CM | POA: Diagnosis not present

## 2015-10-26 DIAGNOSIS — M5136 Other intervertebral disc degeneration, lumbar region: Secondary | ICD-10-CM | POA: Diagnosis not present

## 2015-10-26 DIAGNOSIS — G894 Chronic pain syndrome: Secondary | ICD-10-CM | POA: Diagnosis not present

## 2015-10-26 DIAGNOSIS — M25561 Pain in right knee: Secondary | ICD-10-CM | POA: Diagnosis not present

## 2015-10-28 DIAGNOSIS — F4321 Adjustment disorder with depressed mood: Secondary | ICD-10-CM | POA: Diagnosis not present

## 2015-11-09 DIAGNOSIS — F3130 Bipolar disorder, current episode depressed, mild or moderate severity, unspecified: Secondary | ICD-10-CM | POA: Diagnosis not present

## 2015-11-09 DIAGNOSIS — M25561 Pain in right knee: Secondary | ICD-10-CM | POA: Diagnosis not present

## 2015-11-09 DIAGNOSIS — M519 Unspecified thoracic, thoracolumbar and lumbosacral intervertebral disc disorder: Secondary | ICD-10-CM | POA: Diagnosis not present

## 2015-11-09 DIAGNOSIS — Z1211 Encounter for screening for malignant neoplasm of colon: Secondary | ICD-10-CM | POA: Diagnosis not present

## 2015-11-09 DIAGNOSIS — K219 Gastro-esophageal reflux disease without esophagitis: Secondary | ICD-10-CM | POA: Diagnosis not present

## 2015-11-09 DIAGNOSIS — M25562 Pain in left knee: Secondary | ICD-10-CM | POA: Diagnosis not present

## 2015-11-09 DIAGNOSIS — Z0000 Encounter for general adult medical examination without abnormal findings: Secondary | ICD-10-CM | POA: Diagnosis not present

## 2015-11-09 DIAGNOSIS — I10 Essential (primary) hypertension: Secondary | ICD-10-CM | POA: Diagnosis not present

## 2015-11-09 DIAGNOSIS — G894 Chronic pain syndrome: Secondary | ICD-10-CM | POA: Diagnosis not present

## 2015-11-11 DIAGNOSIS — D443 Neoplasm of uncertain behavior of pituitary gland: Secondary | ICD-10-CM | POA: Diagnosis not present

## 2015-11-11 DIAGNOSIS — H25043 Posterior subcapsular polar age-related cataract, bilateral: Secondary | ICD-10-CM | POA: Diagnosis not present

## 2015-11-17 DIAGNOSIS — F900 Attention-deficit hyperactivity disorder, predominantly inattentive type: Secondary | ICD-10-CM | POA: Diagnosis not present

## 2015-11-17 DIAGNOSIS — F3181 Bipolar II disorder: Secondary | ICD-10-CM | POA: Diagnosis not present

## 2015-11-17 DIAGNOSIS — F410 Panic disorder [episodic paroxysmal anxiety] without agoraphobia: Secondary | ICD-10-CM | POA: Diagnosis not present

## 2015-11-20 DIAGNOSIS — M25561 Pain in right knee: Secondary | ICD-10-CM | POA: Diagnosis not present

## 2015-11-20 DIAGNOSIS — M25562 Pain in left knee: Secondary | ICD-10-CM | POA: Diagnosis not present

## 2015-12-02 DIAGNOSIS — F4321 Adjustment disorder with depressed mood: Secondary | ICD-10-CM | POA: Diagnosis not present

## 2015-12-07 DIAGNOSIS — M25561 Pain in right knee: Secondary | ICD-10-CM | POA: Diagnosis not present

## 2015-12-07 DIAGNOSIS — G894 Chronic pain syndrome: Secondary | ICD-10-CM | POA: Diagnosis not present

## 2015-12-07 DIAGNOSIS — M5136 Other intervertebral disc degeneration, lumbar region: Secondary | ICD-10-CM | POA: Diagnosis not present

## 2015-12-07 DIAGNOSIS — M25562 Pain in left knee: Secondary | ICD-10-CM | POA: Diagnosis not present

## 2015-12-07 DIAGNOSIS — K219 Gastro-esophageal reflux disease without esophagitis: Secondary | ICD-10-CM | POA: Diagnosis not present

## 2016-01-05 DIAGNOSIS — M25561 Pain in right knee: Secondary | ICD-10-CM | POA: Diagnosis not present

## 2016-01-05 DIAGNOSIS — G894 Chronic pain syndrome: Secondary | ICD-10-CM | POA: Diagnosis not present

## 2016-01-05 DIAGNOSIS — M25562 Pain in left knee: Secondary | ICD-10-CM | POA: Diagnosis not present

## 2016-01-05 DIAGNOSIS — K219 Gastro-esophageal reflux disease without esophagitis: Secondary | ICD-10-CM | POA: Diagnosis not present

## 2016-01-05 DIAGNOSIS — M5136 Other intervertebral disc degeneration, lumbar region: Secondary | ICD-10-CM | POA: Diagnosis not present

## 2016-01-11 DIAGNOSIS — F4321 Adjustment disorder with depressed mood: Secondary | ICD-10-CM | POA: Diagnosis not present

## 2016-02-02 DIAGNOSIS — F4321 Adjustment disorder with depressed mood: Secondary | ICD-10-CM | POA: Diagnosis not present

## 2016-03-03 DIAGNOSIS — F4321 Adjustment disorder with depressed mood: Secondary | ICD-10-CM | POA: Diagnosis not present

## 2016-03-07 DIAGNOSIS — N62 Hypertrophy of breast: Secondary | ICD-10-CM | POA: Diagnosis not present

## 2016-03-07 DIAGNOSIS — Z9013 Acquired absence of bilateral breasts and nipples: Secondary | ICD-10-CM | POA: Diagnosis not present

## 2016-03-08 ENCOUNTER — Other Ambulatory Visit: Payer: Self-pay | Admitting: Family Medicine

## 2016-03-08 DIAGNOSIS — Z1231 Encounter for screening mammogram for malignant neoplasm of breast: Secondary | ICD-10-CM

## 2016-03-08 DIAGNOSIS — Z9889 Other specified postprocedural states: Secondary | ICD-10-CM

## 2016-03-21 ENCOUNTER — Ambulatory Visit
Admission: RE | Admit: 2016-03-21 | Discharge: 2016-03-21 | Disposition: A | Discharge status: Home / Self Care | Payer: PPO | Source: Ambulatory Visit | Attending: Family Medicine | Admitting: Family Medicine

## 2016-03-21 DIAGNOSIS — F410 Panic disorder [episodic paroxysmal anxiety] without agoraphobia: Secondary | ICD-10-CM | POA: Diagnosis not present

## 2016-03-21 DIAGNOSIS — Z1231 Encounter for screening mammogram for malignant neoplasm of breast: Secondary | ICD-10-CM | POA: Diagnosis not present

## 2016-03-21 DIAGNOSIS — F332 Major depressive disorder, recurrent severe without psychotic features: Secondary | ICD-10-CM | POA: Diagnosis not present

## 2016-03-21 DIAGNOSIS — Z9889 Other specified postprocedural states: Secondary | ICD-10-CM

## 2016-03-29 DIAGNOSIS — Z23 Encounter for immunization: Secondary | ICD-10-CM | POA: Diagnosis not present

## 2016-03-29 DIAGNOSIS — G894 Chronic pain syndrome: Secondary | ICD-10-CM | POA: Diagnosis not present

## 2016-03-29 DIAGNOSIS — M5136 Other intervertebral disc degeneration, lumbar region: Secondary | ICD-10-CM | POA: Diagnosis not present

## 2016-03-29 DIAGNOSIS — M25561 Pain in right knee: Secondary | ICD-10-CM | POA: Diagnosis not present

## 2016-03-29 DIAGNOSIS — M25562 Pain in left knee: Secondary | ICD-10-CM | POA: Diagnosis not present

## 2016-03-29 DIAGNOSIS — L309 Dermatitis, unspecified: Secondary | ICD-10-CM | POA: Diagnosis not present

## 2016-03-29 DIAGNOSIS — I10 Essential (primary) hypertension: Secondary | ICD-10-CM | POA: Diagnosis not present

## 2016-04-03 DIAGNOSIS — F4321 Adjustment disorder with depressed mood: Secondary | ICD-10-CM | POA: Diagnosis not present

## 2016-04-05 DIAGNOSIS — Z1211 Encounter for screening for malignant neoplasm of colon: Secondary | ICD-10-CM | POA: Diagnosis not present

## 2016-04-05 DIAGNOSIS — F4321 Adjustment disorder with depressed mood: Secondary | ICD-10-CM | POA: Diagnosis not present

## 2016-04-18 DIAGNOSIS — M25562 Pain in left knee: Secondary | ICD-10-CM | POA: Diagnosis not present

## 2016-04-18 DIAGNOSIS — M25561 Pain in right knee: Secondary | ICD-10-CM | POA: Diagnosis not present

## 2016-04-19 DIAGNOSIS — F4321 Adjustment disorder with depressed mood: Secondary | ICD-10-CM | POA: Diagnosis not present

## 2016-05-05 DIAGNOSIS — R300 Dysuria: Secondary | ICD-10-CM | POA: Diagnosis not present

## 2016-05-05 DIAGNOSIS — N3000 Acute cystitis without hematuria: Secondary | ICD-10-CM | POA: Diagnosis not present

## 2016-05-10 DIAGNOSIS — F4321 Adjustment disorder with depressed mood: Secondary | ICD-10-CM | POA: Diagnosis not present

## 2016-06-01 DIAGNOSIS — Z9013 Acquired absence of bilateral breasts and nipples: Secondary | ICD-10-CM | POA: Diagnosis not present

## 2016-06-01 DIAGNOSIS — N62 Hypertrophy of breast: Secondary | ICD-10-CM | POA: Diagnosis not present

## 2016-06-06 DIAGNOSIS — I10 Essential (primary) hypertension: Secondary | ICD-10-CM | POA: Diagnosis not present

## 2016-06-17 DIAGNOSIS — F4321 Adjustment disorder with depressed mood: Secondary | ICD-10-CM | POA: Diagnosis not present

## 2016-06-30 DIAGNOSIS — G894 Chronic pain syndrome: Secondary | ICD-10-CM | POA: Diagnosis not present

## 2016-06-30 DIAGNOSIS — R7301 Impaired fasting glucose: Secondary | ICD-10-CM | POA: Diagnosis not present

## 2016-06-30 DIAGNOSIS — R5383 Other fatigue: Secondary | ICD-10-CM | POA: Diagnosis not present

## 2016-06-30 DIAGNOSIS — M25562 Pain in left knee: Secondary | ICD-10-CM | POA: Diagnosis not present

## 2016-06-30 DIAGNOSIS — M25561 Pain in right knee: Secondary | ICD-10-CM | POA: Diagnosis not present

## 2016-06-30 DIAGNOSIS — G4710 Hypersomnia, unspecified: Secondary | ICD-10-CM | POA: Diagnosis not present

## 2016-07-14 DIAGNOSIS — F4321 Adjustment disorder with depressed mood: Secondary | ICD-10-CM | POA: Diagnosis not present

## 2016-07-21 DIAGNOSIS — F341 Dysthymic disorder: Secondary | ICD-10-CM | POA: Diagnosis not present

## 2016-08-30 DIAGNOSIS — E669 Obesity, unspecified: Secondary | ICD-10-CM | POA: Diagnosis not present

## 2016-08-30 DIAGNOSIS — R5383 Other fatigue: Secondary | ICD-10-CM | POA: Diagnosis not present

## 2016-08-30 DIAGNOSIS — Z6831 Body mass index (BMI) 31.0-31.9, adult: Secondary | ICD-10-CM | POA: Diagnosis not present

## 2016-08-30 DIAGNOSIS — M150 Primary generalized (osteo)arthritis: Secondary | ICD-10-CM | POA: Diagnosis not present

## 2016-08-30 DIAGNOSIS — M2550 Pain in unspecified joint: Secondary | ICD-10-CM | POA: Diagnosis not present

## 2016-08-30 DIAGNOSIS — M545 Low back pain: Secondary | ICD-10-CM | POA: Diagnosis not present

## 2016-09-05 DIAGNOSIS — H538 Other visual disturbances: Secondary | ICD-10-CM | POA: Diagnosis not present

## 2016-09-15 DIAGNOSIS — F4321 Adjustment disorder with depressed mood: Secondary | ICD-10-CM | POA: Diagnosis not present

## 2016-09-21 DIAGNOSIS — F341 Dysthymic disorder: Secondary | ICD-10-CM | POA: Diagnosis not present

## 2016-09-21 DIAGNOSIS — F900 Attention-deficit hyperactivity disorder, predominantly inattentive type: Secondary | ICD-10-CM | POA: Diagnosis not present

## 2016-09-27 DIAGNOSIS — Z6832 Body mass index (BMI) 32.0-32.9, adult: Secondary | ICD-10-CM | POA: Diagnosis not present

## 2016-09-27 DIAGNOSIS — M2550 Pain in unspecified joint: Secondary | ICD-10-CM | POA: Diagnosis not present

## 2016-09-27 DIAGNOSIS — R5383 Other fatigue: Secondary | ICD-10-CM | POA: Diagnosis not present

## 2016-09-27 DIAGNOSIS — M150 Primary generalized (osteo)arthritis: Secondary | ICD-10-CM | POA: Diagnosis not present

## 2016-09-27 DIAGNOSIS — E669 Obesity, unspecified: Secondary | ICD-10-CM | POA: Diagnosis not present

## 2016-09-27 DIAGNOSIS — M545 Low back pain: Secondary | ICD-10-CM | POA: Diagnosis not present

## 2016-10-04 DIAGNOSIS — F319 Bipolar disorder, unspecified: Secondary | ICD-10-CM | POA: Diagnosis not present

## 2016-10-04 DIAGNOSIS — G894 Chronic pain syndrome: Secondary | ICD-10-CM | POA: Diagnosis not present

## 2016-10-04 DIAGNOSIS — I10 Essential (primary) hypertension: Secondary | ICD-10-CM | POA: Diagnosis not present

## 2016-10-18 DIAGNOSIS — F341 Dysthymic disorder: Secondary | ICD-10-CM | POA: Diagnosis not present

## 2016-10-18 DIAGNOSIS — F900 Attention-deficit hyperactivity disorder, predominantly inattentive type: Secondary | ICD-10-CM | POA: Diagnosis not present

## 2016-11-16 DIAGNOSIS — M1712 Unilateral primary osteoarthritis, left knee: Secondary | ICD-10-CM | POA: Diagnosis not present

## 2016-11-16 DIAGNOSIS — M1711 Unilateral primary osteoarthritis, right knee: Secondary | ICD-10-CM | POA: Diagnosis not present

## 2016-11-18 DIAGNOSIS — D443 Neoplasm of uncertain behavior of pituitary gland: Secondary | ICD-10-CM | POA: Diagnosis not present

## 2016-11-18 DIAGNOSIS — H25043 Posterior subcapsular polar age-related cataract, bilateral: Secondary | ICD-10-CM | POA: Diagnosis not present

## 2016-11-23 DIAGNOSIS — M1712 Unilateral primary osteoarthritis, left knee: Secondary | ICD-10-CM | POA: Diagnosis not present

## 2016-11-23 DIAGNOSIS — M1711 Unilateral primary osteoarthritis, right knee: Secondary | ICD-10-CM | POA: Diagnosis not present

## 2016-11-28 DIAGNOSIS — F411 Generalized anxiety disorder: Secondary | ICD-10-CM | POA: Diagnosis not present

## 2016-11-30 DIAGNOSIS — M1712 Unilateral primary osteoarthritis, left knee: Secondary | ICD-10-CM | POA: Diagnosis not present

## 2016-11-30 DIAGNOSIS — M1711 Unilateral primary osteoarthritis, right knee: Secondary | ICD-10-CM | POA: Diagnosis not present

## 2016-12-28 DIAGNOSIS — M25561 Pain in right knee: Secondary | ICD-10-CM | POA: Diagnosis not present

## 2017-01-03 DIAGNOSIS — N941 Unspecified dyspareunia: Secondary | ICD-10-CM | POA: Diagnosis not present

## 2017-01-03 DIAGNOSIS — M25561 Pain in right knee: Secondary | ICD-10-CM | POA: Diagnosis not present

## 2017-01-03 DIAGNOSIS — M5136 Other intervertebral disc degeneration, lumbar region: Secondary | ICD-10-CM | POA: Diagnosis not present

## 2017-01-03 DIAGNOSIS — M25562 Pain in left knee: Secondary | ICD-10-CM | POA: Diagnosis not present

## 2017-01-03 DIAGNOSIS — G894 Chronic pain syndrome: Secondary | ICD-10-CM | POA: Diagnosis not present

## 2017-01-09 DIAGNOSIS — F4321 Adjustment disorder with depressed mood: Secondary | ICD-10-CM | POA: Diagnosis not present

## 2017-02-09 DIAGNOSIS — F4321 Adjustment disorder with depressed mood: Secondary | ICD-10-CM | POA: Diagnosis not present

## 2017-02-21 DIAGNOSIS — Z6832 Body mass index (BMI) 32.0-32.9, adult: Secondary | ICD-10-CM | POA: Diagnosis not present

## 2017-02-21 DIAGNOSIS — Z01419 Encounter for gynecological examination (general) (routine) without abnormal findings: Secondary | ICD-10-CM | POA: Diagnosis not present

## 2017-02-21 DIAGNOSIS — N941 Unspecified dyspareunia: Secondary | ICD-10-CM | POA: Diagnosis not present

## 2017-02-21 DIAGNOSIS — Z780 Asymptomatic menopausal state: Secondary | ICD-10-CM | POA: Diagnosis not present

## 2017-02-21 DIAGNOSIS — N762 Acute vulvitis: Secondary | ICD-10-CM | POA: Diagnosis not present

## 2017-03-02 DIAGNOSIS — F900 Attention-deficit hyperactivity disorder, predominantly inattentive type: Secondary | ICD-10-CM | POA: Diagnosis not present

## 2017-03-02 DIAGNOSIS — F3342 Major depressive disorder, recurrent, in full remission: Secondary | ICD-10-CM | POA: Diagnosis not present

## 2017-03-09 DIAGNOSIS — I10 Essential (primary) hypertension: Secondary | ICD-10-CM | POA: Diagnosis not present

## 2017-03-09 DIAGNOSIS — Z6832 Body mass index (BMI) 32.0-32.9, adult: Secondary | ICD-10-CM | POA: Diagnosis not present

## 2017-03-09 DIAGNOSIS — M48061 Spinal stenosis, lumbar region without neurogenic claudication: Secondary | ICD-10-CM | POA: Diagnosis not present

## 2017-03-23 DIAGNOSIS — F4321 Adjustment disorder with depressed mood: Secondary | ICD-10-CM | POA: Diagnosis not present

## 2017-03-24 DIAGNOSIS — M5136 Other intervertebral disc degeneration, lumbar region: Secondary | ICD-10-CM | POA: Diagnosis not present

## 2017-03-24 DIAGNOSIS — M48061 Spinal stenosis, lumbar region without neurogenic claudication: Secondary | ICD-10-CM | POA: Diagnosis not present

## 2017-03-24 DIAGNOSIS — M5416 Radiculopathy, lumbar region: Secondary | ICD-10-CM | POA: Diagnosis not present

## 2017-03-24 DIAGNOSIS — M4726 Other spondylosis with radiculopathy, lumbar region: Secondary | ICD-10-CM | POA: Diagnosis not present

## 2017-04-06 DIAGNOSIS — M48061 Spinal stenosis, lumbar region without neurogenic claudication: Secondary | ICD-10-CM | POA: Diagnosis not present

## 2017-04-06 DIAGNOSIS — I10 Essential (primary) hypertension: Secondary | ICD-10-CM | POA: Diagnosis not present

## 2017-04-06 DIAGNOSIS — Z6831 Body mass index (BMI) 31.0-31.9, adult: Secondary | ICD-10-CM | POA: Diagnosis not present

## 2017-04-11 DIAGNOSIS — Z23 Encounter for immunization: Secondary | ICD-10-CM | POA: Diagnosis not present

## 2017-04-11 DIAGNOSIS — M5136 Other intervertebral disc degeneration, lumbar region: Secondary | ICD-10-CM | POA: Diagnosis not present

## 2017-04-11 DIAGNOSIS — M25561 Pain in right knee: Secondary | ICD-10-CM | POA: Diagnosis not present

## 2017-04-11 DIAGNOSIS — I10 Essential (primary) hypertension: Secondary | ICD-10-CM | POA: Diagnosis not present

## 2017-04-11 DIAGNOSIS — G894 Chronic pain syndrome: Secondary | ICD-10-CM | POA: Diagnosis not present

## 2017-04-11 DIAGNOSIS — M25562 Pain in left knee: Secondary | ICD-10-CM | POA: Diagnosis not present

## 2017-05-02 DIAGNOSIS — F4321 Adjustment disorder with depressed mood: Secondary | ICD-10-CM | POA: Diagnosis not present

## 2017-05-15 DIAGNOSIS — M6281 Muscle weakness (generalized): Secondary | ICD-10-CM | POA: Diagnosis not present

## 2017-05-15 DIAGNOSIS — N9412 Deep dyspareunia: Secondary | ICD-10-CM | POA: Diagnosis not present

## 2017-05-15 DIAGNOSIS — M62838 Other muscle spasm: Secondary | ICD-10-CM | POA: Diagnosis not present

## 2017-05-23 DIAGNOSIS — M6281 Muscle weakness (generalized): Secondary | ICD-10-CM | POA: Diagnosis not present

## 2017-05-23 DIAGNOSIS — M1990 Unspecified osteoarthritis, unspecified site: Secondary | ICD-10-CM | POA: Diagnosis not present

## 2017-05-23 DIAGNOSIS — M62838 Other muscle spasm: Secondary | ICD-10-CM | POA: Diagnosis not present

## 2017-05-23 DIAGNOSIS — N9412 Deep dyspareunia: Secondary | ICD-10-CM | POA: Diagnosis not present

## 2017-06-02 DIAGNOSIS — M62838 Other muscle spasm: Secondary | ICD-10-CM | POA: Diagnosis not present

## 2017-06-02 DIAGNOSIS — N9412 Deep dyspareunia: Secondary | ICD-10-CM | POA: Diagnosis not present

## 2017-06-02 DIAGNOSIS — M6281 Muscle weakness (generalized): Secondary | ICD-10-CM | POA: Diagnosis not present

## 2017-06-05 DIAGNOSIS — M62838 Other muscle spasm: Secondary | ICD-10-CM | POA: Diagnosis not present

## 2017-06-05 DIAGNOSIS — M6281 Muscle weakness (generalized): Secondary | ICD-10-CM | POA: Diagnosis not present

## 2017-06-05 DIAGNOSIS — M1990 Unspecified osteoarthritis, unspecified site: Secondary | ICD-10-CM | POA: Diagnosis not present

## 2017-06-05 DIAGNOSIS — N9412 Deep dyspareunia: Secondary | ICD-10-CM | POA: Diagnosis not present

## 2017-06-05 DIAGNOSIS — F419 Anxiety disorder, unspecified: Secondary | ICD-10-CM | POA: Diagnosis not present

## 2017-06-14 ENCOUNTER — Ambulatory Visit
Admission: RE | Admit: 2017-06-14 | Discharge: 2017-06-14 | Disposition: A | Discharge status: Home / Self Care | Payer: PPO | Source: Ambulatory Visit | Attending: Family Medicine | Admitting: Family Medicine

## 2017-06-14 ENCOUNTER — Other Ambulatory Visit: Payer: Self-pay | Admitting: Family Medicine

## 2017-06-14 DIAGNOSIS — S93401A Sprain of unspecified ligament of right ankle, initial encounter: Secondary | ICD-10-CM

## 2017-06-14 DIAGNOSIS — M7989 Other specified soft tissue disorders: Secondary | ICD-10-CM | POA: Diagnosis not present

## 2017-06-14 DIAGNOSIS — M25571 Pain in right ankle and joints of right foot: Secondary | ICD-10-CM | POA: Diagnosis not present

## 2017-06-14 DIAGNOSIS — S82891A Other fracture of right lower leg, initial encounter for closed fracture: Secondary | ICD-10-CM | POA: Diagnosis not present

## 2017-06-28 DIAGNOSIS — M25571 Pain in right ankle and joints of right foot: Secondary | ICD-10-CM | POA: Diagnosis not present

## 2017-06-29 DIAGNOSIS — F4321 Adjustment disorder with depressed mood: Secondary | ICD-10-CM | POA: Diagnosis not present

## 2017-07-12 DIAGNOSIS — F3342 Major depressive disorder, recurrent, in full remission: Secondary | ICD-10-CM | POA: Diagnosis not present

## 2017-07-12 DIAGNOSIS — M25571 Pain in right ankle and joints of right foot: Secondary | ICD-10-CM | POA: Diagnosis not present

## 2017-07-13 DIAGNOSIS — B372 Candidiasis of skin and nail: Secondary | ICD-10-CM | POA: Diagnosis not present

## 2017-07-13 DIAGNOSIS — G894 Chronic pain syndrome: Secondary | ICD-10-CM | POA: Diagnosis not present

## 2017-07-13 DIAGNOSIS — Z79899 Other long term (current) drug therapy: Secondary | ICD-10-CM | POA: Diagnosis not present

## 2017-07-13 DIAGNOSIS — E785 Hyperlipidemia, unspecified: Secondary | ICD-10-CM | POA: Diagnosis not present

## 2017-07-13 DIAGNOSIS — I10 Essential (primary) hypertension: Secondary | ICD-10-CM | POA: Diagnosis not present

## 2017-07-13 DIAGNOSIS — F319 Bipolar disorder, unspecified: Secondary | ICD-10-CM | POA: Diagnosis not present

## 2017-07-13 DIAGNOSIS — E669 Obesity, unspecified: Secondary | ICD-10-CM | POA: Diagnosis not present

## 2017-08-07 DIAGNOSIS — M5416 Radiculopathy, lumbar region: Secondary | ICD-10-CM | POA: Diagnosis not present

## 2017-08-07 DIAGNOSIS — M5136 Other intervertebral disc degeneration, lumbar region: Secondary | ICD-10-CM | POA: Diagnosis not present

## 2017-08-07 DIAGNOSIS — Z981 Arthrodesis status: Secondary | ICD-10-CM | POA: Diagnosis not present

## 2017-08-07 DIAGNOSIS — M48061 Spinal stenosis, lumbar region without neurogenic claudication: Secondary | ICD-10-CM | POA: Diagnosis not present

## 2017-08-07 DIAGNOSIS — M4726 Other spondylosis with radiculopathy, lumbar region: Secondary | ICD-10-CM | POA: Diagnosis not present

## 2017-08-11 DIAGNOSIS — M25571 Pain in right ankle and joints of right foot: Secondary | ICD-10-CM | POA: Diagnosis not present

## 2017-08-17 ENCOUNTER — Ambulatory Visit: Payer: PPO | Admitting: Family Medicine

## 2017-09-02 DIAGNOSIS — H9203 Otalgia, bilateral: Secondary | ICD-10-CM | POA: Diagnosis not present

## 2017-10-17 DIAGNOSIS — I10 Essential (primary) hypertension: Secondary | ICD-10-CM | POA: Diagnosis not present

## 2017-10-17 DIAGNOSIS — Z6834 Body mass index (BMI) 34.0-34.9, adult: Secondary | ICD-10-CM | POA: Diagnosis not present

## 2017-10-17 DIAGNOSIS — R252 Cramp and spasm: Secondary | ICD-10-CM | POA: Diagnosis not present

## 2017-10-17 DIAGNOSIS — G894 Chronic pain syndrome: Secondary | ICD-10-CM | POA: Diagnosis not present

## 2017-10-27 DIAGNOSIS — F4321 Adjustment disorder with depressed mood: Secondary | ICD-10-CM | POA: Diagnosis not present

## 2017-10-31 DIAGNOSIS — M5126 Other intervertebral disc displacement, lumbar region: Secondary | ICD-10-CM | POA: Diagnosis not present

## 2017-10-31 DIAGNOSIS — M48061 Spinal stenosis, lumbar region without neurogenic claudication: Secondary | ICD-10-CM | POA: Diagnosis not present

## 2017-11-07 DIAGNOSIS — M545 Low back pain: Secondary | ICD-10-CM | POA: Diagnosis not present

## 2017-11-07 DIAGNOSIS — M48061 Spinal stenosis, lumbar region without neurogenic claudication: Secondary | ICD-10-CM | POA: Diagnosis not present

## 2017-11-15 DIAGNOSIS — M545 Low back pain: Secondary | ICD-10-CM | POA: Diagnosis not present

## 2017-11-15 DIAGNOSIS — M48061 Spinal stenosis, lumbar region without neurogenic claudication: Secondary | ICD-10-CM | POA: Diagnosis not present

## 2017-11-16 ENCOUNTER — Other Ambulatory Visit: Payer: Self-pay | Admitting: Family Medicine

## 2017-11-16 DIAGNOSIS — Z1231 Encounter for screening mammogram for malignant neoplasm of breast: Secondary | ICD-10-CM

## 2017-11-17 DIAGNOSIS — N62 Hypertrophy of breast: Secondary | ICD-10-CM | POA: Diagnosis not present

## 2017-11-20 DIAGNOSIS — M48061 Spinal stenosis, lumbar region without neurogenic claudication: Secondary | ICD-10-CM | POA: Diagnosis not present

## 2017-11-20 DIAGNOSIS — M545 Low back pain: Secondary | ICD-10-CM | POA: Diagnosis not present

## 2017-11-23 DIAGNOSIS — M545 Low back pain: Secondary | ICD-10-CM | POA: Diagnosis not present

## 2017-11-23 DIAGNOSIS — M48061 Spinal stenosis, lumbar region without neurogenic claudication: Secondary | ICD-10-CM | POA: Diagnosis not present

## 2017-11-27 DIAGNOSIS — M545 Low back pain: Secondary | ICD-10-CM | POA: Diagnosis not present

## 2017-11-27 DIAGNOSIS — M48061 Spinal stenosis, lumbar region without neurogenic claudication: Secondary | ICD-10-CM | POA: Diagnosis not present

## 2017-11-30 DIAGNOSIS — M48061 Spinal stenosis, lumbar region without neurogenic claudication: Secondary | ICD-10-CM | POA: Diagnosis not present

## 2017-11-30 DIAGNOSIS — M545 Low back pain: Secondary | ICD-10-CM | POA: Diagnosis not present

## 2017-12-04 DIAGNOSIS — M48061 Spinal stenosis, lumbar region without neurogenic claudication: Secondary | ICD-10-CM | POA: Diagnosis not present

## 2017-12-04 DIAGNOSIS — N62 Hypertrophy of breast: Secondary | ICD-10-CM | POA: Diagnosis not present

## 2017-12-04 DIAGNOSIS — M545 Low back pain: Secondary | ICD-10-CM | POA: Diagnosis not present

## 2017-12-05 DIAGNOSIS — M25561 Pain in right knee: Secondary | ICD-10-CM | POA: Diagnosis not present

## 2017-12-05 DIAGNOSIS — M25511 Pain in right shoulder: Secondary | ICD-10-CM | POA: Diagnosis not present

## 2017-12-05 DIAGNOSIS — M25552 Pain in left hip: Secondary | ICD-10-CM | POA: Diagnosis not present

## 2017-12-05 DIAGNOSIS — M25562 Pain in left knee: Secondary | ICD-10-CM | POA: Diagnosis not present

## 2017-12-07 DIAGNOSIS — M25511 Pain in right shoulder: Secondary | ICD-10-CM | POA: Diagnosis not present

## 2017-12-07 DIAGNOSIS — M545 Low back pain: Secondary | ICD-10-CM | POA: Diagnosis not present

## 2017-12-07 DIAGNOSIS — G894 Chronic pain syndrome: Secondary | ICD-10-CM | POA: Diagnosis not present

## 2017-12-07 DIAGNOSIS — M48061 Spinal stenosis, lumbar region without neurogenic claudication: Secondary | ICD-10-CM | POA: Diagnosis not present

## 2017-12-07 DIAGNOSIS — M25562 Pain in left knee: Secondary | ICD-10-CM | POA: Diagnosis not present

## 2017-12-11 DIAGNOSIS — M545 Low back pain: Secondary | ICD-10-CM | POA: Diagnosis not present

## 2017-12-11 DIAGNOSIS — M48061 Spinal stenosis, lumbar region without neurogenic claudication: Secondary | ICD-10-CM | POA: Diagnosis not present

## 2017-12-12 ENCOUNTER — Ambulatory Visit: Payer: PPO

## 2017-12-14 DIAGNOSIS — M48061 Spinal stenosis, lumbar region without neurogenic claudication: Secondary | ICD-10-CM | POA: Diagnosis not present

## 2017-12-14 DIAGNOSIS — M545 Low back pain: Secondary | ICD-10-CM | POA: Diagnosis not present

## 2017-12-15 ENCOUNTER — Telehealth: Payer: Self-pay | Admitting: Physical Medicine & Rehabilitation

## 2017-12-15 NOTE — Telephone Encounter (Signed)
Patient called and states she was a past appointment wants to be referred gave her process

## 2017-12-16 DIAGNOSIS — M25561 Pain in right knee: Secondary | ICD-10-CM | POA: Diagnosis not present

## 2017-12-19 DIAGNOSIS — M48061 Spinal stenosis, lumbar region without neurogenic claudication: Secondary | ICD-10-CM | POA: Diagnosis not present

## 2017-12-25 DIAGNOSIS — F4321 Adjustment disorder with depressed mood: Secondary | ICD-10-CM | POA: Diagnosis not present

## 2017-12-25 DIAGNOSIS — M48061 Spinal stenosis, lumbar region without neurogenic claudication: Secondary | ICD-10-CM | POA: Diagnosis not present

## 2017-12-25 DIAGNOSIS — M545 Low back pain: Secondary | ICD-10-CM | POA: Diagnosis not present

## 2017-12-26 ENCOUNTER — Other Ambulatory Visit (HOSPITAL_COMMUNITY): Payer: Self-pay | Admitting: Neurological Surgery

## 2017-12-26 ENCOUNTER — Other Ambulatory Visit: Payer: Self-pay | Admitting: Neurological Surgery

## 2017-12-26 DIAGNOSIS — M48061 Spinal stenosis, lumbar region without neurogenic claudication: Secondary | ICD-10-CM

## 2017-12-26 DIAGNOSIS — M48062 Spinal stenosis, lumbar region with neurogenic claudication: Secondary | ICD-10-CM | POA: Diagnosis not present

## 2017-12-27 DIAGNOSIS — M48061 Spinal stenosis, lumbar region without neurogenic claudication: Secondary | ICD-10-CM | POA: Diagnosis not present

## 2017-12-27 DIAGNOSIS — M545 Low back pain: Secondary | ICD-10-CM | POA: Diagnosis not present

## 2017-12-27 MED ORDER — SODIUM CHLORIDE 0.9 % IV SOLN: 4.0000 mg | Freq: Four times a day (QID) | INTRAVENOUS | Status: DC | PRN

## 2017-12-28 DIAGNOSIS — M545 Low back pain: Secondary | ICD-10-CM | POA: Diagnosis not present

## 2017-12-28 DIAGNOSIS — M48061 Spinal stenosis, lumbar region without neurogenic claudication: Secondary | ICD-10-CM | POA: Diagnosis not present

## 2017-12-29 ENCOUNTER — Ambulatory Visit
Admission: RE | Admit: 2017-12-29 | Discharge: 2017-12-29 | Disposition: A | Discharge status: Home / Self Care | Payer: PPO | Source: Ambulatory Visit | Attending: Family Medicine | Admitting: Family Medicine

## 2017-12-29 DIAGNOSIS — Z1231 Encounter for screening mammogram for malignant neoplasm of breast: Secondary | ICD-10-CM

## 2018-01-01 DIAGNOSIS — M48061 Spinal stenosis, lumbar region without neurogenic claudication: Secondary | ICD-10-CM | POA: Diagnosis not present

## 2018-01-01 DIAGNOSIS — M545 Low back pain: Secondary | ICD-10-CM | POA: Diagnosis not present

## 2018-01-08 DIAGNOSIS — M48061 Spinal stenosis, lumbar region without neurogenic claudication: Secondary | ICD-10-CM | POA: Diagnosis not present

## 2018-01-08 DIAGNOSIS — M545 Low back pain: Secondary | ICD-10-CM | POA: Diagnosis not present

## 2018-01-09 DIAGNOSIS — F3342 Major depressive disorder, recurrent, in full remission: Secondary | ICD-10-CM | POA: Diagnosis not present

## 2018-01-09 DIAGNOSIS — F900 Attention-deficit hyperactivity disorder, predominantly inattentive type: Secondary | ICD-10-CM | POA: Diagnosis not present

## 2018-01-11 DIAGNOSIS — M545 Low back pain: Secondary | ICD-10-CM | POA: Diagnosis not present

## 2018-01-11 DIAGNOSIS — M48061 Spinal stenosis, lumbar region without neurogenic claudication: Secondary | ICD-10-CM | POA: Diagnosis not present

## 2018-01-15 DIAGNOSIS — M545 Low back pain: Secondary | ICD-10-CM | POA: Diagnosis not present

## 2018-01-15 DIAGNOSIS — M48061 Spinal stenosis, lumbar region without neurogenic claudication: Secondary | ICD-10-CM | POA: Diagnosis not present

## 2018-01-17 ENCOUNTER — Ambulatory Visit (HOSPITAL_COMMUNITY)
Admission: RE | Admit: 2018-01-17 | Discharge: 2018-01-17 | Disposition: A | Discharge status: Home / Self Care | Payer: PPO | Source: Ambulatory Visit | Attending: Neurological Surgery | Admitting: Neurological Surgery

## 2018-01-17 DIAGNOSIS — Z981 Arthrodesis status: Secondary | ICD-10-CM | POA: Insufficient documentation

## 2018-01-17 DIAGNOSIS — M5126 Other intervertebral disc displacement, lumbar region: Secondary | ICD-10-CM | POA: Diagnosis not present

## 2018-01-17 DIAGNOSIS — M48061 Spinal stenosis, lumbar region without neurogenic claudication: Secondary | ICD-10-CM | POA: Diagnosis not present

## 2018-01-17 DIAGNOSIS — M5116 Intervertebral disc disorders with radiculopathy, lumbar region: Secondary | ICD-10-CM | POA: Diagnosis not present

## 2018-01-17 DIAGNOSIS — M4316 Spondylolisthesis, lumbar region: Secondary | ICD-10-CM | POA: Insufficient documentation

## 2018-01-17 DIAGNOSIS — M48062 Spinal stenosis, lumbar region with neurogenic claudication: Secondary | ICD-10-CM | POA: Diagnosis not present

## 2018-01-17 MED ORDER — DIAZEPAM 5 MG PO TABS
10.0000 mg | ORAL_TABLET | Freq: Once | ORAL | Status: AC
  Administered 2018-01-17: 10 mg via ORAL

## 2018-01-17 MED ORDER — IOPAMIDOL (ISOVUE-M 200) INJECTION 41%
20.0000 mL | Freq: Once | INTRAMUSCULAR | Status: AC
  Administered 2018-01-17: 12 mL via INTRATHECAL

## 2018-01-17 MED ORDER — LIDOCAINE HCL (PF) 1 % IJ SOLN
5.0000 mL | Freq: Once | INTRAMUSCULAR | Status: AC
  Administered 2018-01-17: 2 mL via INTRADERMAL

## 2018-01-17 MED ORDER — DIAZEPAM 5 MG PO TABS
ORAL_TABLET | ORAL | Status: AC
  Administered 2018-01-17: 10 mg via ORAL
  Filled 2018-01-17: qty 2

## 2018-01-17 MED ORDER — ONDANSETRON HCL 4 MG/2ML IJ SOLN: 4.0000 mg | Freq: Four times a day (QID) | INTRAMUSCULAR | Status: DC | PRN

## 2018-01-17 MED ORDER — KETOROLAC TROMETHAMINE 30 MG/ML IJ SOLN
INTRAMUSCULAR | Status: AC
  Filled 2018-01-17: qty 1

## 2018-01-17 MED ORDER — OXYCODONE-ACETAMINOPHEN 7.5-325 MG PO TABS: 1.0000 | ORAL_TABLET | ORAL | Status: DC | PRN

## 2018-01-17 MED ORDER — KETOROLAC TROMETHAMINE 15 MG/ML IJ SOLN
15.0000 mg | Freq: Once | INTRAMUSCULAR | Status: AC
  Administered 2018-01-17: 15 mg via INTRAMUSCULAR
  Filled 2018-01-17: qty 1

## 2018-01-17 NOTE — Procedures (Signed)
Shelley Vasquez is a 52 year old individual whose had a degenerative spondylolisthesis at L4-L5 that was decompressed and fused a number of years ago.  She did reasonably well but has had progressive problems with back pain and is been on pain management using a fentanyl pain patch in addition to oxycodone for breakthrough pain she notes that the pain in her back and her leg seems to be worsening and MRI suggests presence of adjacent level disease at L3-L4 I advised a myelogram and post myelogram CAT scan to obtain some dynamic imaging.  Pre op Dx: Lumbar stenosis L3-L4, history of fusion L4-L5 Post op Dx: Same Procedure: Lumbar myelogram Surgeon: Tennile Styles Puncture level: L2-3 Fluid color: Clear colorless Injection: Isovue-200, 12 mL Findings: Stenosis at L3-4 and L2-3 when patient is standing relieved with supine position.  Further evaluation with CT scanning.

## 2018-01-17 NOTE — Discharge Instructions (Signed)
**Note -identified via Obfuscation** Myelogram, Care After °These instructions give you information about caring for yourself after your procedure. Your doctor may also give you more specific instructions. Call your doctor if you have any problems or questions after your procedure. °Follow these instructions at home: °· Drink enough fluid to keep your pee (urine) clear or pale yellow. °· Rest as told by your doctor. °· Lie flat with your head slightly raised (elevated). °· Do not bend, lift, or do any hard activities for 24-48 hours or as told by your doctor. °· Take over-the-counter and prescription medicines only as told by your doctor. °· Take care of and remove your bandage (dressing) as told by your doctor. °· Bathe or shower as told by your doctor. °Contact a health care provider if: °· You have a fever. °· You have a headache that lasts longer than 24 hours. °· You feel sick to your stomach (nauseous). °· You throw up (vomit). °· Your neck is stiff. °· Your legs feel numb. °· You cannot pee. °· You cannot poop (have a bowel movement). °· You have a rash. °· You are itchy or sneezing. °Get help right away if: °· You have new symptoms or your symptoms get worse. °· You have a seizure. °· You have trouble breathing. °This information is not intended to replace advice given to you by your health care provider. Make sure you discuss any questions you have with your health care provider. °Document Released: 03/15/2008 Document Revised: 02/04/2016 Document Reviewed: 03/19/2015 °Elsevier Interactive Patient Education © 2018 [employer] ° °

## 2018-01-18 DIAGNOSIS — R030 Elevated blood-pressure reading, without diagnosis of hypertension: Secondary | ICD-10-CM | POA: Diagnosis not present

## 2018-01-18 DIAGNOSIS — M48062 Spinal stenosis, lumbar region with neurogenic claudication: Secondary | ICD-10-CM | POA: Diagnosis not present

## 2018-01-18 DIAGNOSIS — Z6835 Body mass index (BMI) 35.0-35.9, adult: Secondary | ICD-10-CM | POA: Diagnosis not present

## 2018-01-22 DIAGNOSIS — I10 Essential (primary) hypertension: Secondary | ICD-10-CM | POA: Diagnosis not present

## 2018-01-22 DIAGNOSIS — Z1211 Encounter for screening for malignant neoplasm of colon: Secondary | ICD-10-CM | POA: Diagnosis not present

## 2018-01-22 DIAGNOSIS — G894 Chronic pain syndrome: Secondary | ICD-10-CM | POA: Diagnosis not present

## 2018-01-25 DIAGNOSIS — M48061 Spinal stenosis, lumbar region without neurogenic claudication: Secondary | ICD-10-CM | POA: Diagnosis not present

## 2018-01-25 DIAGNOSIS — M545 Low back pain: Secondary | ICD-10-CM | POA: Diagnosis not present

## 2018-01-29 DIAGNOSIS — M545 Low back pain: Secondary | ICD-10-CM | POA: Diagnosis not present

## 2018-01-29 DIAGNOSIS — M48061 Spinal stenosis, lumbar region without neurogenic claudication: Secondary | ICD-10-CM | POA: Diagnosis not present

## 2018-02-01 ENCOUNTER — Other Ambulatory Visit: Payer: Self-pay | Admitting: Neurological Surgery

## 2018-02-01 DIAGNOSIS — S0011XA Contusion of right eyelid and periocular area, initial encounter: Secondary | ICD-10-CM | POA: Diagnosis not present

## 2018-02-01 DIAGNOSIS — G894 Chronic pain syndrome: Secondary | ICD-10-CM | POA: Diagnosis not present

## 2018-02-02 ENCOUNTER — Ambulatory Visit
Admission: RE | Admit: 2018-02-02 | Discharge: 2018-02-02 | Disposition: A | Discharge status: Home / Self Care | Payer: PPO | Source: Ambulatory Visit | Attending: Family Medicine | Admitting: Family Medicine

## 2018-02-02 ENCOUNTER — Other Ambulatory Visit: Payer: Self-pay | Admitting: Family Medicine

## 2018-02-02 DIAGNOSIS — S0990XA Unspecified injury of head, initial encounter: Secondary | ICD-10-CM | POA: Diagnosis not present

## 2018-02-02 DIAGNOSIS — S0011XA Contusion of right eyelid and periocular area, initial encounter: Secondary | ICD-10-CM

## 2018-02-05 ENCOUNTER — Other Ambulatory Visit: Payer: PPO

## 2018-02-05 DIAGNOSIS — M48061 Spinal stenosis, lumbar region without neurogenic claudication: Secondary | ICD-10-CM | POA: Diagnosis not present

## 2018-02-05 DIAGNOSIS — M545 Low back pain: Secondary | ICD-10-CM | POA: Diagnosis not present

## 2018-02-06 NOTE — Pre-Procedure Instructions (Signed)
Shelley Vasquez  02/06/2018      [address], [place] [address] Phone: [phone] Fax: [phone]    Your procedure is scheduled on Monday August 26.  Report to [facility] Admitting at 5:30 A.M.  Call this number if you have problems the morning of surgery:  [phone]   Remember:  Do not eat or drink after midnight.    Take these medicines the morning of surgery with A SIP OF WATER:   Pantoprazole (protonic) Zanaflex if needed Oxycodone (percocet) if needed  7 days prior to surgery STOP taking any Aspirin(unless otherwise instructed by your surgeon), celebrex (Celecoxib), Aleve, Naproxen, Ibuprofen, Motrin, Advil, Goody's, BC's, all herbal medications, fish oil, and all vitamins       Do not wear jewelry, make-up or nail polish.  Do not wear lotions, powders, or perfumes, or deodorant.  Do not shave 48 hours prior to surgery.  Men may shave face and neck.  Do not bring valuables to the hospital.  [facility] is not responsible for any belongings or valuables.  Contacts, dentures or bridgework may not be worn into surgery.  Leave your suitcase in the car.  After surgery it may be brought to your room.  For patients admitted to the hospital, discharge time will be determined by your treatment team.  Patients discharged the day of surgery will not be allowed to drive home.   Special instructions:    [place]- Preparing For Surgery  Before surgery, you can play an important role. Because skin is not sterile, your skin needs to be as free of germs as possible. You can reduce the number of germs on your skin by washing with CHG (chlorahexidine gluconate) Soap before surgery.  CHG is an antiseptic cleaner which kills germs and bonds with the skin to continue killing germs even after washing.    Oral Hygiene is also important to reduce your risk of infection.  Remember -  BRUSH YOUR TEETH THE MORNING OF SURGERY WITH YOUR REGULAR TOOTHPASTE  Please do not use if you have an allergy to CHG or antibacterial soaps. If your skin becomes reddened/irritated stop using the CHG.  Do not shave (including legs and underarms) for at least 48 hours prior to first CHG shower. It is OK to shave your face.  Please follow these instructions carefully.   1. Shower the NIGHT BEFORE SURGERY and the MORNING OF SURGERY with CHG.   2. If you chose to wash your hair, wash your hair first as usual with your normal shampoo.  3. After you shampoo, rinse your hair and body thoroughly to remove the shampoo.  4. Use CHG as you would any other liquid soap. You can apply CHG directly to the skin and wash gently with a scrungie or a clean washcloth.   5. Apply the CHG Soap to your body ONLY FROM THE NECK DOWN.  Do not use on open wounds or open sores. Avoid contact with your eyes, ears, mouth and genitals (private parts). Wash Face and genitals (private parts)  with your normal soap.  6. Wash thoroughly, paying special attention to the area where your surgery will be performed.  7. Thoroughly rinse your body with warm water from the neck down.  8. DO NOT shower/wash with your normal soap after using and rinsing off the CHG Soap.  9. Pat yourself dry with a CLEAN TOWEL.  10. Wear CLEAN PAJAMAS to bed the night before surgery, wear comfortable clothes the morning of surgery  11. Place CLEAN SHEETS on your bed the night of your first shower and DO NOT SLEEP WITH PETS.    Day of Surgery:  Do not apply any deodorants/lotions.  Please wear clean clothes to the hospital/surgery center.   Remember to brush your teeth WITH YOUR REGULAR TOOTHPASTE.    Please read over the following fact sheets that you were given. Coughing and Deep Breathing, MRSA Information and Surgical Site Infection Prevention

## 2018-02-07 ENCOUNTER — Other Ambulatory Visit: Payer: Self-pay

## 2018-02-07 ENCOUNTER — Encounter (HOSPITAL_COMMUNITY): Payer: Self-pay

## 2018-02-07 ENCOUNTER — Encounter (HOSPITAL_COMMUNITY)
Admission: RE | Admit: 2018-02-07 | Discharge: 2018-02-07 | Disposition: A | Discharge status: Home / Self Care | Payer: PPO | Source: Ambulatory Visit | Attending: Neurological Surgery | Admitting: Neurological Surgery

## 2018-02-07 DIAGNOSIS — Z01812 Encounter for preprocedural laboratory examination: Secondary | ICD-10-CM | POA: Insufficient documentation

## 2018-02-07 DIAGNOSIS — Z01810 Encounter for preprocedural cardiovascular examination: Secondary | ICD-10-CM | POA: Insufficient documentation

## 2018-02-07 DIAGNOSIS — R9431 Abnormal electrocardiogram [ECG] [EKG]: Secondary | ICD-10-CM | POA: Insufficient documentation

## 2018-02-07 DIAGNOSIS — M48062 Spinal stenosis, lumbar region with neurogenic claudication: Secondary | ICD-10-CM | POA: Insufficient documentation

## 2018-02-07 HISTORY — DX: Bipolar disorder, unspecified: F31.9

## 2018-02-07 HISTORY — DX: Anxiety disorder, unspecified: F41.9

## 2018-02-07 HISTORY — DX: Depression, unspecified: F32.A

## 2018-02-07 HISTORY — DX: Major depressive disorder, single episode, unspecified: F32.9

## 2018-02-07 HISTORY — DX: Unspecified osteoarthritis, unspecified site: M19.90

## 2018-02-07 HISTORY — DX: Sleep apnea, unspecified: G47.30

## 2018-02-07 HISTORY — DX: Essential (primary) hypertension: I10

## 2018-02-07 LAB — CBC
HEMATOCRIT: 38.8 % (ref 36.0–46.0)
Hemoglobin: 13 g/dL (ref 12.0–15.0)
MCH: 30.5 pg (ref 26.0–34.0)
MCHC: 33.5 g/dL (ref 30.0–36.0)
MCV: 91.1 fL (ref 78.0–100.0)
Platelets: 280 10*3/uL (ref 150–400)
RBC: 4.26 MIL/uL (ref 3.87–5.11)
RDW: 12.2 % (ref 11.5–15.5)
WBC: 4.4 10*3/uL (ref 4.0–10.5)

## 2018-02-07 LAB — BASIC METABOLIC PANEL
ANION GAP: 9 (ref 5–15)
BUN: 12 mg/dL (ref 6–20)
CALCIUM: 9.5 mg/dL (ref 8.9–10.3)
CO2: 29 mmol/L (ref 22–32)
Chloride: 101 mmol/L (ref 98–111)
Creatinine, Ser: 0.97 mg/dL (ref 0.44–1.00)
Glucose, Bld: 144 mg/dL — ABNORMAL HIGH (ref 70–99)
POTASSIUM: 3.4 mmol/L — AB (ref 3.5–5.1)
Sodium: 139 mmol/L (ref 135–145)

## 2018-02-07 LAB — SURGICAL PCR SCREEN
MRSA, PCR: NEGATIVE
STAPHYLOCOCCUS AUREUS: NEGATIVE

## 2018-02-07 LAB — NO BLOOD PRODUCTS

## 2018-02-07 NOTE — Progress Notes (Addendum)
PCP: Harlan Stains, MD  Cardiologist: patient denies  EKG: 02/07/18  Stress test: pt denies  ECHO: pt denies  Cardiac Cath: pt denies  Chest x-ray: pt denies past year, no recent respiratory infections/complications  Patient is a Jehovah's witness, blood refusal scanned and on chart. Advance directives on chart as well.

## 2018-02-12 ENCOUNTER — Inpatient Hospital Stay (HOSPITAL_COMMUNITY)
Admission: RE | Admit: 2018-02-12 | Discharge: 2018-02-16 | DRG: 460 | Disposition: A | Discharge status: Home Health | Payer: PPO | Attending: Neurological Surgery | Admitting: Neurological Surgery

## 2018-02-12 ENCOUNTER — Encounter (HOSPITAL_COMMUNITY)
Admission: RE | Disposition: A | Discharge status: Home Health | Payer: Self-pay | Source: Home / Self Care | Attending: Neurological Surgery

## 2018-02-12 ENCOUNTER — Inpatient Hospital Stay (HOSPITAL_COMMUNITY): Payer: PPO | Admitting: Anesthesiology

## 2018-02-12 ENCOUNTER — Inpatient Hospital Stay (HOSPITAL_COMMUNITY): Payer: PPO

## 2018-02-12 ENCOUNTER — Encounter (HOSPITAL_COMMUNITY): Payer: Self-pay | Admitting: Certified Registered"

## 2018-02-12 ENCOUNTER — Other Ambulatory Visit: Payer: Self-pay

## 2018-02-12 DIAGNOSIS — Z981 Arthrodesis status: Secondary | ICD-10-CM | POA: Diagnosis not present

## 2018-02-12 DIAGNOSIS — Z90710 Acquired absence of both cervix and uterus: Secondary | ICD-10-CM | POA: Diagnosis not present

## 2018-02-12 DIAGNOSIS — Z79891 Long term (current) use of opiate analgesic: Secondary | ICD-10-CM

## 2018-02-12 DIAGNOSIS — F319 Bipolar disorder, unspecified: Secondary | ICD-10-CM | POA: Diagnosis present

## 2018-02-12 DIAGNOSIS — Z79899 Other long term (current) drug therapy: Secondary | ICD-10-CM

## 2018-02-12 DIAGNOSIS — I10 Essential (primary) hypertension: Secondary | ICD-10-CM | POA: Diagnosis present

## 2018-02-12 DIAGNOSIS — M5410 Radiculopathy, site unspecified: Secondary | ICD-10-CM | POA: Diagnosis not present

## 2018-02-12 DIAGNOSIS — M4050 Lordosis, unspecified, site unspecified: Secondary | ICD-10-CM | POA: Diagnosis not present

## 2018-02-12 DIAGNOSIS — M4726 Other spondylosis with radiculopathy, lumbar region: Secondary | ICD-10-CM | POA: Diagnosis present

## 2018-02-12 DIAGNOSIS — Z886 Allergy status to analgesic agent status: Secondary | ICD-10-CM

## 2018-02-12 DIAGNOSIS — M4326 Fusion of spine, lumbar region: Secondary | ICD-10-CM | POA: Diagnosis not present

## 2018-02-12 DIAGNOSIS — Z885 Allergy status to narcotic agent status: Secondary | ICD-10-CM

## 2018-02-12 DIAGNOSIS — M48061 Spinal stenosis, lumbar region without neurogenic claudication: Principal | ICD-10-CM | POA: Diagnosis present

## 2018-02-12 DIAGNOSIS — M479 Spondylosis, unspecified: Secondary | ICD-10-CM | POA: Diagnosis present

## 2018-02-12 DIAGNOSIS — Z96653 Presence of artificial knee joint, bilateral: Secondary | ICD-10-CM | POA: Diagnosis present

## 2018-02-12 DIAGNOSIS — Z419 Encounter for procedure for purposes other than remedying health state, unspecified: Secondary | ICD-10-CM

## 2018-02-12 DIAGNOSIS — M5136 Other intervertebral disc degeneration, lumbar region: Secondary | ICD-10-CM | POA: Diagnosis not present

## 2018-02-12 DIAGNOSIS — Z888 Allergy status to other drugs, medicaments and biological substances status: Secondary | ICD-10-CM | POA: Diagnosis not present

## 2018-02-12 HISTORY — PX: ANTERIOR LAT LUMBAR FUSION: SHX1168

## 2018-02-12 SURGERY — ANTERIOR LATERAL LUMBAR FUSION 2 LEVELS
Anesthesia: General | Site: Spine Lumbar | Laterality: Left

## 2018-02-12 MED ORDER — MIDAZOLAM HCL 2 MG/2ML IJ SOLN
INTRAMUSCULAR | Status: AC
  Filled 2018-02-12: qty 2

## 2018-02-12 MED ORDER — PROPOFOL 10 MG/ML IV BOLUS
INTRAVENOUS | Status: AC
  Filled 2018-02-12: qty 20

## 2018-02-12 MED ORDER — ACETAMINOPHEN 650 MG RE SUPP: 650.0000 mg | RECTAL | Status: DC | PRN

## 2018-02-12 MED ORDER — PHENOL 1.4 % MT LIQD: 1.0000 | OROMUCOSAL | Status: DC | PRN

## 2018-02-12 MED ORDER — METHOCARBAMOL 1000 MG/10ML IJ SOLN: 500.0000 mg | Freq: Four times a day (QID) | INTRAVENOUS | Status: DC | PRN

## 2018-02-12 MED ORDER — GABAPENTIN 600 MG PO TABS
300.0000 mg | ORAL_TABLET | Freq: Once | ORAL | Status: AC
  Administered 2018-02-12: 300 mg via ORAL
  Filled 2018-02-12: qty 0.5

## 2018-02-12 MED ORDER — CHLORHEXIDINE GLUCONATE CLOTH 2 % EX PADS: 6.0000 | MEDICATED_PAD | Freq: Once | CUTANEOUS | Status: DC

## 2018-02-12 MED ORDER — LIDOCAINE-EPINEPHRINE 1 %-1:100000 IJ SOLN
INTRAMUSCULAR | Status: DC | PRN
  Administered 2018-02-12: 9 mL

## 2018-02-12 MED ORDER — 0.9 % SODIUM CHLORIDE (POUR BTL) OPTIME
TOPICAL | Status: DC | PRN
  Administered 2018-02-12: 1000 mL

## 2018-02-12 MED ORDER — HYOSCYAMINE SULFATE 0.125 MG SL SUBL
0.1250 mg | SUBLINGUAL_TABLET | SUBLINGUAL | Status: DC | PRN
  Filled 2018-02-12: qty 1

## 2018-02-12 MED ORDER — SODIUM CHLORIDE 0.9% FLUSH
3.0000 mL | INTRAVENOUS | Status: DC | PRN
  Administered 2018-02-15: 3 mL via INTRAVENOUS
  Filled 2018-02-12: qty 3

## 2018-02-12 MED ORDER — BUPIVACAINE HCL (PF) 0.5 % IJ SOLN
INTRAMUSCULAR | Status: AC
  Filled 2018-02-12: qty 30

## 2018-02-12 MED ORDER — MAGNESIUM OXIDE 400 (241.3 MG) MG PO TABS
400.0000 mg | ORAL_TABLET | Freq: Two times a day (BID) | ORAL | Status: DC
  Administered 2018-02-12 – 2018-02-16 (×8): 400 mg via ORAL
  Filled 2018-02-12 (×8): qty 1

## 2018-02-12 MED ORDER — EPHEDRINE SULFATE-NACL 50-0.9 MG/10ML-% IV SOSY
PREFILLED_SYRINGE | INTRAVENOUS | Status: DC | PRN
  Administered 2018-02-12 (×2): 5 mg via INTRAVENOUS

## 2018-02-12 MED ORDER — DIAZEPAM 5 MG/ML IJ SOLN
INTRAMUSCULAR | Status: AC
  Filled 2018-02-12: qty 2

## 2018-02-12 MED ORDER — FENTANYL CITRATE (PF) 100 MCG/2ML IJ SOLN
25.0000 ug | INTRAMUSCULAR | Status: DC | PRN
  Administered 2018-02-12 (×3): 50 ug via INTRAVENOUS
  Filled 2018-02-12: qty 2

## 2018-02-12 MED ORDER — LIDOCAINE-EPINEPHRINE 1 %-1:100000 IJ SOLN
INTRAMUSCULAR | Status: AC
  Filled 2018-02-12: qty 1

## 2018-02-12 MED ORDER — DIPHENHYDRAMINE HCL 50 MG/ML IJ SOLN: 12.5000 mg | Freq: Four times a day (QID) | INTRAMUSCULAR | Status: DC | PRN

## 2018-02-12 MED ORDER — ONDANSETRON HCL 4 MG PO TABS: 4.0000 mg | ORAL_TABLET | Freq: Four times a day (QID) | ORAL | Status: DC | PRN

## 2018-02-12 MED ORDER — FENTANYL 25 MCG/HR TD PT72: 50.0000 ug | MEDICATED_PATCH | TRANSDERMAL | Status: DC

## 2018-02-12 MED ORDER — OXYCODONE-ACETAMINOPHEN 7.5-325 MG PO TABS
1.0000 | ORAL_TABLET | ORAL | Status: DC | PRN
  Administered 2018-02-12: 1 via ORAL
  Filled 2018-02-12: qty 1

## 2018-02-12 MED ORDER — FENTANYL CITRATE (PF) 100 MCG/2ML IJ SOLN
INTRAMUSCULAR | Status: DC | PRN
  Administered 2018-02-12 (×4): 50 ug via INTRAVENOUS
  Administered 2018-02-12: 150 ug via INTRAVENOUS
  Administered 2018-02-12 (×2): 50 ug via INTRAVENOUS

## 2018-02-12 MED ORDER — CEFAZOLIN SODIUM-DEXTROSE 2-4 GM/100ML-% IV SOLN
2.0000 g | Freq: Three times a day (TID) | INTRAVENOUS | Status: AC
  Administered 2018-02-12 – 2018-02-13 (×2): 2 g via INTRAVENOUS
  Filled 2018-02-12 (×2): qty 100

## 2018-02-12 MED ORDER — LAMOTRIGINE 100 MG PO TABS
400.0000 mg | ORAL_TABLET | Freq: Every day | ORAL | Status: DC
  Administered 2018-02-12 – 2018-02-15 (×4): 400 mg via ORAL
  Filled 2018-02-12 (×4): qty 4

## 2018-02-12 MED ORDER — ONDANSETRON HCL 4 MG/2ML IJ SOLN: 4.0000 mg | Freq: Four times a day (QID) | INTRAMUSCULAR | Status: DC | PRN

## 2018-02-12 MED ORDER — DOCUSATE SODIUM 100 MG PO CAPS
100.0000 mg | ORAL_CAPSULE | Freq: Two times a day (BID) | ORAL | Status: DC
  Administered 2018-02-12 – 2018-02-16 (×8): 100 mg via ORAL
  Filled 2018-02-12 (×8): qty 1

## 2018-02-12 MED ORDER — LIDOCAINE 2% (20 MG/ML) 5 ML SYRINGE
INTRAMUSCULAR | Status: DC | PRN
  Administered 2018-02-12: 50 mg via INTRAVENOUS

## 2018-02-12 MED ORDER — SODIUM CHLORIDE 0.9 % IV SOLN
250.0000 mL | INTRAVENOUS | Status: DC
  Administered 2018-02-12: 250 mL via INTRAVENOUS

## 2018-02-12 MED ORDER — SODIUM CHLORIDE 0.9 % IV SOLN
INTRAVENOUS | Status: DC | PRN
  Administered 2018-02-12: 25 ug/min via INTRAVENOUS

## 2018-02-12 MED ORDER — FENTANYL CITRATE (PF) 100 MCG/2ML IJ SOLN
INTRAMUSCULAR | Status: AC
  Filled 2018-02-12: qty 4

## 2018-02-12 MED ORDER — SODIUM CHLORIDE 0.9% FLUSH
3.0000 mL | Freq: Two times a day (BID) | INTRAVENOUS | Status: DC
  Administered 2018-02-14 – 2018-02-15 (×4): 3 mL via INTRAVENOUS

## 2018-02-12 MED ORDER — ACETAMINOPHEN 325 MG PO TABS: 650.0000 mg | ORAL_TABLET | ORAL | Status: DC | PRN

## 2018-02-12 MED ORDER — ONDANSETRON HCL 4 MG/2ML IJ SOLN
INTRAMUSCULAR | Status: DC | PRN
  Administered 2018-02-12: 4 mg via INTRAVENOUS

## 2018-02-12 MED ORDER — SENNA 8.6 MG PO TABS
1.0000 | ORAL_TABLET | Freq: Two times a day (BID) | ORAL | Status: DC
  Administered 2018-02-12 – 2018-02-16 (×7): 8.6 mg via ORAL
  Filled 2018-02-12 (×7): qty 1

## 2018-02-12 MED ORDER — DIAZEPAM 5 MG/ML IJ SOLN
5.0000 mg | Freq: Four times a day (QID) | INTRAMUSCULAR | Status: DC | PRN
  Administered 2018-02-12 – 2018-02-13 (×3): 5 mg via INTRAVENOUS
  Filled 2018-02-12 (×2): qty 2

## 2018-02-12 MED ORDER — DOCUSATE SODIUM 100 MG PO CAPS: 400.0000 mg | ORAL_CAPSULE | Freq: Two times a day (BID) | ORAL | Status: DC

## 2018-02-12 MED ORDER — NALOXONE HCL 0.4 MG/ML IJ SOLN: 0.4000 mg | INTRAMUSCULAR | Status: DC | PRN

## 2018-02-12 MED ORDER — ONDANSETRON HCL 4 MG/2ML IJ SOLN
INTRAMUSCULAR | Status: AC
  Filled 2018-02-12: qty 2

## 2018-02-12 MED ORDER — BUPIVACAINE HCL (PF) 0.5 % IJ SOLN
INTRAMUSCULAR | Status: DC | PRN
  Administered 2018-02-12: 9 mL

## 2018-02-12 MED ORDER — CEFAZOLIN SODIUM-DEXTROSE 2-4 GM/100ML-% IV SOLN
INTRAVENOUS | Status: AC
  Filled 2018-02-12: qty 100

## 2018-02-12 MED ORDER — METHOCARBAMOL 500 MG PO TABS
500.0000 mg | ORAL_TABLET | Freq: Four times a day (QID) | ORAL | Status: DC | PRN
  Administered 2018-02-12 – 2018-02-16 (×14): 500 mg via ORAL
  Filled 2018-02-12 (×13): qty 1

## 2018-02-12 MED ORDER — FLEET ENEMA 7-19 GM/118ML RE ENEM: 1.0000 | ENEMA | Freq: Once | RECTAL | Status: DC | PRN

## 2018-02-12 MED ORDER — TRIAMTERENE-HCTZ 37.5-25 MG PO TABS
0.5000 | ORAL_TABLET | Freq: Every day | ORAL | Status: DC
  Administered 2018-02-12 – 2018-02-16 (×5): 0.5 via ORAL
  Filled 2018-02-12 (×5): qty 1

## 2018-02-12 MED ORDER — KETOROLAC TROMETHAMINE 30 MG/ML IJ SOLN
30.0000 mg | Freq: Once | INTRAMUSCULAR | Status: AC
  Administered 2018-02-12: 30 mg via INTRAVENOUS

## 2018-02-12 MED ORDER — BISACODYL 10 MG RE SUPP: 10.0000 mg | Freq: Every day | RECTAL | Status: DC | PRN

## 2018-02-12 MED ORDER — ROCURONIUM BROMIDE 50 MG/5ML IV SOSY
PREFILLED_SYRINGE | INTRAVENOUS | Status: AC
  Filled 2018-02-12: qty 5

## 2018-02-12 MED ORDER — SODIUM CHLORIDE 0.9% FLUSH: 9.0000 mL | INTRAVENOUS | Status: DC | PRN

## 2018-02-12 MED ORDER — PROPOFOL 500 MG/50ML IV EMUL
INTRAVENOUS | Status: DC | PRN
  Administered 2018-02-12: 25 ug/kg/min via INTRAVENOUS

## 2018-02-12 MED ORDER — SUMATRIPTAN SUCCINATE 100 MG PO TABS
100.0000 mg | ORAL_TABLET | ORAL | Status: DC | PRN
  Administered 2018-02-13 – 2018-02-16 (×4): 100 mg via ORAL
  Filled 2018-02-12 (×7): qty 1

## 2018-02-12 MED ORDER — CLONAZEPAM 1 MG PO TABS
1.0000 mg | ORAL_TABLET | Freq: Every day | ORAL | Status: DC
  Administered 2018-02-12 – 2018-02-15 (×4): 1 mg via ORAL
  Filled 2018-02-12 (×4): qty 1

## 2018-02-12 MED ORDER — PROPOFOL 10 MG/ML IV BOLUS
INTRAVENOUS | Status: DC | PRN
  Administered 2018-02-12: 180 mg via INTRAVENOUS

## 2018-02-12 MED ORDER — GUANFACINE HCL ER 1 MG PO TB24
4.0000 mg | ORAL_TABLET | Freq: Every day | ORAL | Status: DC
  Administered 2018-02-12 – 2018-02-16 (×5): 4 mg via ORAL
  Filled 2018-02-12 (×6): qty 4

## 2018-02-12 MED ORDER — FENTANYL CITRATE (PF) 250 MCG/5ML IJ SOLN
INTRAMUSCULAR | Status: AC
  Filled 2018-02-12: qty 5

## 2018-02-12 MED ORDER — LACTATED RINGERS IV SOLN
INTRAVENOUS | Status: DC
  Administered 2018-02-12 – 2018-02-13 (×2): via INTRAVENOUS

## 2018-02-12 MED ORDER — MENTHOL 3 MG MT LOZG: 1.0000 | LOZENGE | OROMUCOSAL | Status: DC | PRN

## 2018-02-12 MED ORDER — TRAZODONE HCL 50 MG PO TABS
150.0000 mg | ORAL_TABLET | Freq: Every day | ORAL | Status: DC
  Administered 2018-02-12 – 2018-02-15 (×4): 150 mg via ORAL
  Filled 2018-02-12 (×4): qty 1

## 2018-02-12 MED ORDER — FENTANYL 40 MCG/ML IV SOLN
INTRAVENOUS | Status: DC
  Administered 2018-02-12: 330 ug via INTRAVENOUS
  Administered 2018-02-12: 1000 ug via INTRAVENOUS
  Administered 2018-02-12: 225 ug via INTRAVENOUS
  Administered 2018-02-13: 1000 ug via INTRAVENOUS
  Administered 2018-02-13: 300 ug via INTRAVENOUS
  Administered 2018-02-13: 165 ug via INTRAVENOUS
  Administered 2018-02-13: 210 ug via INTRAVENOUS
  Administered 2018-02-13: 135 ug via INTRAVENOUS
  Administered 2018-02-13: 1000 ug via INTRAVENOUS
  Filled 2018-02-12 (×3): qty 25
  Filled 2018-02-12: qty 1000

## 2018-02-12 MED ORDER — MIDAZOLAM HCL 5 MG/5ML IJ SOLN
INTRAMUSCULAR | Status: DC | PRN
  Administered 2018-02-12: 2 mg via INTRAVENOUS

## 2018-02-12 MED ORDER — KETOROLAC TROMETHAMINE 30 MG/ML IJ SOLN
INTRAMUSCULAR | Status: AC
  Filled 2018-02-12: qty 1

## 2018-02-12 MED ORDER — POLYETHYLENE GLYCOL 3350 17 G PO PACK: 17.0000 g | PACK | Freq: Every day | ORAL | Status: DC | PRN

## 2018-02-12 MED ORDER — DIPHENHYDRAMINE HCL 12.5 MG/5ML PO ELIX: 12.5000 mg | ORAL_SOLUTION | Freq: Four times a day (QID) | ORAL | Status: DC | PRN

## 2018-02-12 MED ORDER — ALUM & MAG HYDROXIDE-SIMETH 200-200-20 MG/5ML PO SUSP
30.0000 mL | Freq: Four times a day (QID) | ORAL | Status: DC | PRN
  Filled 2018-02-12: qty 30

## 2018-02-12 MED ORDER — SUCCINYLCHOLINE CHLORIDE 20 MG/ML IJ SOLN
INTRAMUSCULAR | Status: DC | PRN
  Administered 2018-02-12: 140 mg via INTRAVENOUS

## 2018-02-12 MED ORDER — HEMOSTATIC AGENTS (NO CHARGE) OPTIME
TOPICAL | Status: DC | PRN
  Administered 2018-02-12: 1

## 2018-02-12 MED ORDER — CEFAZOLIN SODIUM-DEXTROSE 2-4 GM/100ML-% IV SOLN
2.0000 g | INTRAVENOUS | Status: AC
  Administered 2018-02-12: 2 g via INTRAVENOUS

## 2018-02-12 MED ORDER — OXYCODONE-ACETAMINOPHEN 7.5-325 MG PO TABS
ORAL_TABLET | ORAL | Status: AC
  Filled 2018-02-12: qty 1

## 2018-02-12 MED ORDER — DEXAMETHASONE SODIUM PHOSPHATE 10 MG/ML IJ SOLN
INTRAMUSCULAR | Status: DC | PRN
  Administered 2018-02-12: 10 mg via INTRAVENOUS

## 2018-02-12 MED ORDER — METHOCARBAMOL 500 MG PO TABS
ORAL_TABLET | ORAL | Status: AC
  Filled 2018-02-12: qty 1

## 2018-02-12 MED ORDER — ONDANSETRON HCL 4 MG/2ML IJ SOLN: 4.0000 mg | Freq: Once | INTRAMUSCULAR | Status: DC | PRN

## 2018-02-12 MED ORDER — PANTOPRAZOLE SODIUM 40 MG PO TBEC
40.0000 mg | DELAYED_RELEASE_TABLET | Freq: Two times a day (BID) | ORAL | Status: DC
  Administered 2018-02-12 – 2018-02-16 (×8): 40 mg via ORAL
  Filled 2018-02-12 (×8): qty 1

## 2018-02-12 MED ORDER — GABAPENTIN 300 MG PO CAPS
300.0000 mg | ORAL_CAPSULE | Freq: Three times a day (TID) | ORAL | Status: DC
  Administered 2018-02-13 – 2018-02-15 (×9): 300 mg via ORAL
  Filled 2018-02-12 (×10): qty 1

## 2018-02-12 MED ORDER — LACTATED RINGERS IV SOLN
INTRAVENOUS | Status: DC | PRN
  Administered 2018-02-12 (×2): via INTRAVENOUS

## 2018-02-12 MED ORDER — TIZANIDINE HCL 4 MG PO TABS
4.0000 mg | ORAL_TABLET | Freq: Three times a day (TID) | ORAL | Status: DC | PRN
  Administered 2018-02-12 – 2018-02-13 (×3): 4 mg via ORAL
  Filled 2018-02-12 (×4): qty 1

## 2018-02-12 SURGICAL SUPPLY — 52 items
ADH SKN CLS APL DERMABOND .7 (GAUZE/BANDAGES/DRESSINGS) ×1
AGENT HMST KT MTR STRL THRMB (HEMOSTASIS) ×1
BLADE CLIPPER SURG (BLADE) IMPLANT
BOLT SPNL LRG 45X5.5XPLAT NS (Screw) IMPLANT
DERMABOND ADVANCED (GAUZE/BANDAGES/DRESSINGS) ×2
DERMABOND ADVANCED .7 DNX12 (GAUZE/BANDAGES/DRESSINGS) ×1 IMPLANT
DRAPE C-ARM 42X72 X-RAY (DRAPES) ×3 IMPLANT
DRAPE C-ARMOR (DRAPES) ×3 IMPLANT
DRAPE LAPAROTOMY 100X72X124 (DRAPES) ×3 IMPLANT
DURAPREP 26ML APPLICATOR (WOUND CARE) ×3 IMPLANT
ELECT REM PT RETURN 9FT ADLT (ELECTROSURGICAL) ×3
ELECTRODE REM PT RTRN 9FT ADLT (ELECTROSURGICAL) ×1 IMPLANT
GAUZE 4X4 16PLY RFD (DISPOSABLE) IMPLANT
GLOVE BIOGEL PI IND STRL 7.5 (GLOVE) IMPLANT
GLOVE BIOGEL PI IND STRL 8 (GLOVE) IMPLANT
GLOVE BIOGEL PI IND STRL 8.5 (GLOVE) ×1 IMPLANT
GLOVE BIOGEL PI INDICATOR 7.5 (GLOVE) ×6
GLOVE BIOGEL PI INDICATOR 8 (GLOVE) ×4
GLOVE BIOGEL PI INDICATOR 8.5 (GLOVE) ×2
GLOVE ECLIPSE 7.5 STRL STRAW (GLOVE) ×2 IMPLANT
GLOVE ECLIPSE 8.5 STRL (GLOVE) ×3 IMPLANT
GLOVE SURG SS PI 7.0 STRL IVOR (GLOVE) ×6 IMPLANT
GOWN STRL REUS W/ TWL LRG LVL3 (GOWN DISPOSABLE) IMPLANT
GOWN STRL REUS W/ TWL XL LVL3 (GOWN DISPOSABLE) ×1 IMPLANT
GOWN STRL REUS W/TWL 2XL LVL3 (GOWN DISPOSABLE) ×3 IMPLANT
GOWN STRL REUS W/TWL LRG LVL3 (GOWN DISPOSABLE)
GOWN STRL REUS W/TWL XL LVL3 (GOWN DISPOSABLE) ×9
KIT BASIN OR (CUSTOM PROCEDURE TRAY) ×3 IMPLANT
KIT DILATOR XLIF 5 (KITS) ×1 IMPLANT
KIT INFUSE X SMALL 1.4CC (Orthopedic Implant) ×2 IMPLANT
KIT SURGICAL ACCESS MAXCESS 4 (KITS) ×2 IMPLANT
KIT TURNOVER KIT B (KITS) ×3 IMPLANT
KIT XLIF (KITS) ×1
MODULE NVM5 NEXT GEN EMG (NEEDLE) ×2 IMPLANT
MODULUS XLW 12X22X50MM 10DEG (Spine Construct) ×4 IMPLANT
NDL HYPO 25X1 1.5 SAFETY (NEEDLE) ×1 IMPLANT
NEEDLE HYPO 25X1 1.5 SAFETY (NEEDLE) ×3 IMPLANT
NS IRRIG 1000ML POUR BTL (IV SOLUTION) ×3 IMPLANT
PACK LAMINECTOMY NEURO (CUSTOM PROCEDURE TRAY) ×3 IMPLANT
PLATE DECADE XLIP 2H SZ12 (Plate) ×4 IMPLANT
PUTTY BONE ATTRAX 10CC STRIP (Putty) ×2 IMPLANT
SCREW 45MM (Screw) ×6 IMPLANT
SCREW DECADE 5.5X50 (Screw) ×4 IMPLANT
SPONGE LAP 4X18 RFD (DISPOSABLE) IMPLANT
STAPLER VISISTAT 35W (STAPLE) ×2 IMPLANT
SURGIFLO W/THROMBIN 8M KIT (HEMOSTASIS) ×2 IMPLANT
SUT VIC AB 2-0 CP2 18 (SUTURE) ×3 IMPLANT
SUT VIC AB 3-0 SH 8-18 (SUTURE) ×5 IMPLANT
TOWEL GREEN STERILE (TOWEL DISPOSABLE) ×3 IMPLANT
TOWEL GREEN STERILE FF (TOWEL DISPOSABLE) ×3 IMPLANT
TRAY FOLEY MTR SLVR 16FR STAT (SET/KITS/TRAYS/PACK) ×3 IMPLANT
WATER STERILE IRR 1000ML POUR (IV SOLUTION) ×3 IMPLANT

## 2018-02-12 NOTE — Anesthesia Postprocedure Evaluation (Signed)
Anesthesia Post Note  Patient: Shelley Vasquez  Procedure(s) Performed: Lumbar Two - Three Lumbar Three - Four Anterolateral lumbar decompression/fusion with lateral plate (Left Spine Lumbar)     Patient location during evaluation: PACU Anesthesia Type: General Level of consciousness: awake and alert Pain management: pain level controlled Vital Signs Assessment: post-procedure vital signs reviewed and stable Respiratory status: spontaneous breathing, nonlabored ventilation, respiratory function stable and patient connected to nasal cannula oxygen Cardiovascular status: blood pressure returned to baseline and stable Postop Assessment: no apparent nausea or vomiting Anesthetic complications: no    Last Vitals:  Vitals:   02/12/18 1612 02/12/18 1838  BP:    Pulse:    Resp: 18 18  Temp:    SpO2: 92%     Last Pain:  Vitals:   02/12/18 1612  TempSrc:   PainSc: 8                  Sabriya Yono COKER

## 2018-02-12 NOTE — Anesthesia Preprocedure Evaluation (Addendum)
Anesthesia Evaluation  Patient identified by MRN, date of birth, ID band Patient awake    Reviewed: Allergy & Precautions, NPO status , Patient's Chart, lab work & pertinent test results  Airway Mallampati: II  TM Distance: >3 FB Neck ROM: Full    Dental  (+) Teeth Intact, Dental Advisory Given   Pulmonary    breath sounds clear to auscultation       Cardiovascular hypertension,  Rhythm:Regular Rate:Normal     Neuro/Psych PSYCHIATRIC DISORDERS Bipolar Disorder    GI/Hepatic   Endo/Other    Renal/GU      Musculoskeletal   Abdominal   Peds  Hematology  (+) REFUSES BLOOD PRODUCTS, JEHOVAH'S WITNESS  Anesthesia Other Findings   Reproductive/Obstetrics                            Anesthesia Physical Anesthesia Plan  ASA: III  Anesthesia Plan: General   Post-op Pain Management:    Induction: Intravenous  PONV Risk Score and Plan: Ondansetron and Dexamethasone  Airway Management Planned: Oral ETT  Additional Equipment:   Intra-op Plan:   Post-operative Plan:   Informed Consent: I have reviewed the patients History and Physical, chart, labs and discussed the procedure including the risks, benefits and alternatives for the proposed anesthesia with the patient or authorized representative who has indicated his/her understanding and acceptance.   Dental advisory given  Plan Discussed with: CRNA and Anesthesiologist  Anesthesia Plan Comments:         Anesthesia Quick Evaluation

## 2018-02-12 NOTE — Anesthesia Procedure Notes (Signed)
Procedure Name: Intubation Date/Time: 02/12/2018 7:57 AM Performed by: Gaylene Brooks, CRNA Pre-anesthesia Checklist: Patient identified, Emergency Drugs available, Suction available and Patient being monitored Patient Re-evaluated:Patient Re-evaluated prior to induction Oxygen Delivery Method: Circle System Utilized Preoxygenation: Pre-oxygenation with 100% oxygen Induction Type: IV induction Ventilation: Mask ventilation without difficulty Laryngoscope Size: Miller and 2 Grade View: Grade I Tube type: Oral Tube size: 7.0 mm Number of attempts: 1 Airway Equipment and Method: Stylet,  Oral airway and LTA kit utilized Placement Confirmation: ETT inserted through vocal cords under direct vision,  positive ETCO2 and breath sounds checked- equal and bilateral Secured at: 22 cm Tube secured with: Tape Dental Injury: Teeth and Oropharynx as per pre-operative assessment

## 2018-02-12 NOTE — Progress Notes (Signed)
Elsner at bedside, gave verbal order for Valium 5mg  IV PRN Q6, first dose given.  Rowe Pavy, RN

## 2018-02-12 NOTE — Op Note (Signed)
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  History of fusion L4-L5 Postoperative diagnosis: Same Procedure: Anterolateral decompression L2-3 and L3-4 with placement of X-LIF spacer measuring 12 x 22 x 50 mm at L2-3 and L3-4 using tricalcium phosphate bone substitute and infuse grafting material with decade lateral plate measuring 12 mm in height with 50 mm screws at L3-4 and 45 mm screws at L2-3 Surgeon: Kristeen Miss First assistant: Jovita Gamma, MD Anesthesia: General endotracheal Indication: Shelley Vasquez is a 52 year old individuals had significant issues with back pain and radiculopathy in the past she underwent a decompression and fusion at L4-5 a number of years ago and did reasonably well she has had 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  She is been advised regarding surgical decompression and arthrodesis using an indirect technique.  Procedure: Patient was brought to the operating room supine on the stretcher.  After the smooth induction of general endotracheal anesthesia EMG monitoring electrodes were placed over the major muscle groups in the lower extremities and then the patient was carefully turned into the right lateral decubitus position.  She was held in position with tape bandages to secure the lateral position and orthogonal radiographs were obtained with the fluoroscopy unit to verify this.  Then by marking the skin on the lateral border for the L3-4 and the L2-3 interspaces the skin was prepped with alcohol DuraPrep and then draped in a sterile fashion.  Angular incision between the L2-3 and L3-4 interspaces was then created and carried down through the lateral fascia.  Posteriorly a second incision was made to enter into the retroperitoneal space with the index digit on  the left hand.  The muscle was penetrated with a Kelly clamp followed by insertion of the finger into the retroperitoneal space to identify the lateral border of the fascia.  A probe was then passed through the lateral incision and guided towards the psoas muscle.  Then using fluoroscopic guidance the probe was anchored onto the interspace at L3-4.  Neural monitoring was then performed to verify that no elements of the lumbar plexus were nearby.  This was then dilated to a 15 mm size and ultimately a retractor with 160 mm long blades was used to distract the space down to the lateral border of the disc space at L3-4.  Then the area was checked to make sure that no nerve roots were nearby to allow placement of a shim into the disc space.  This was done under direct visual and fluoroscopic guidance.  The disc space was opened with a #15 blade and a long Cobb elevator was used to open the disc space further and open the contralateral ligament.  A series of disc shavers was then used to remove disc material from within the disc space.  Curettes and rongeurs were then used to evacuate a substantial quantity of significantly degenerated disc material from the 3 4 interspace and the space was cleared adequately the endplates were curettaged.  Then a series of spacer trials were used to gradually place a 12 mm tall by 22 mm long 55 and millimeters wide trial spacer.  It was decided that ultimately a 12 x 22 x 50 mm spacer with 10 degrees lordosis would fit best into this interspace.  This was then filled with some tricalcium phosphate material mixed with infuse.  The  spacer was then placed into the interspace under direct visualization.  With the spacer being placed lateral plate measuring 12 mm in height with 50 mm screws was secured to the lateral aspect of the vertebral body at L3-4.  The retractor was removed and final radiographs were obtained attention was then turned to L2-3 where the same procedure was repeated.   Here 45 mm long screws were used for the same size plate was used in the interspace.  In the end the final radiographs demonstrated good alignment of the vertebrae opening of the interspace.  With the retractors removed the deep fascia was closed with 2-0 Vicryl in interrupted fashion 3-0 Vicryl was used in the subarticular skin Dermabond was placed on the skin for both incisions.  Blood loss for the procedure was estimated at less than 100 cc.

## 2018-02-12 NOTE — H&P (Signed)
Shelley Vasquez is an 52 y.o. female.   Chief Complaint: Pain and leg pain history of fusion L4-5 HPI: Patient is a 52 year old individual who has had previous degenerative changes at L4-L5.  He has developed adjacent level disease at L2-3 and L3-4.  Been advised regarding the need for surgical intervention decompress and stabilize those levels and this is being now performed via an anterolateral approach.  Past Medical History:  Diagnosis Date  . Anxiety   . Arthritis   . Bipolar disorder (Shelley Vasquez)   . Depression   . Hypertension   . Sleep apnea    does not use CPAP    Past Surgical History:  Procedure Laterality Date  . ABDOMINAL HYSTERECTOMY     10/2010  . BACK SURGERY     2011 lumbar 4-5 fusion  . BREAST SURGERY  01/2010   breast reduction  . left knee arthroscopic Bilateral    2016 bilateral  . TOE SURGERY      History reviewed. No pertinent family history. Social History:  reports that she has never smoked. She has never used smokeless tobacco. She reports that she drank alcohol. She reports that she has current or past drug history. Drug: Oxycodone.  Allergies:  Allergies  Allergen Reactions  . Nsaids Other (See Comments)    Bleeding ulcer  . Prednisone Other (See Comments)    Suicidal  . Prozac [Fluoxetine Hcl] Anxiety and Hypertension  . Lithium Other (See Comments)    Lithium poisoning   . Wellbutrin [Bupropion]     UNSPECIFIED REACTION   . Dilaudid [Hydromorphone Hcl] Other (See Comments)    Agitation  . Escitalopram Oxalate Other (See Comments)    Shaking  . Zolpidem Tartrate Other (See Comments)    Stay up    Medications Prior to Admission  Medication Sig Dispense Refill  . betamethasone valerate (VALISONE) 0.1 % cream Apply 1 application topically daily as needed (for irritation).    . clonazePAM (KLONOPIN) 1 MG tablet Take 1 mg by mouth at bedtime.     . docusate sodium (COLACE) 100 MG capsule Take 400 mg by mouth 2 (two) times daily.    . fentaNYL  (DURAGESIC - DOSED MCG/HR) 50 MCG/HR Place 50 mcg onto the skin every 3 (three) days. Every 3 days    . guanFACINE (INTUNIV) 4 MG TB24 ER tablet Take 4 mg by mouth daily.    Marland Kitchen lamoTRIgine (LAMICTAL) 200 MG tablet Take 400 mg by mouth at bedtime.    . Magnesium 250 MG TABS Take 500 mg by mouth 2 (two) times daily.    Marland Kitchen oxyCODONE-acetaminophen (PERCOCET) 7.5-325 MG tablet Take 1 tablet by mouth every 4 (four) hours as needed for severe pain.    . pantoprazole (PROTONIX) 40 MG tablet Take 40 mg by mouth 2 (two) times daily.    . rizatriptan (MAXALT) 10 MG tablet Take 10 mg by mouth as needed for migraine. May repeat in 2 hours if needed    . tiZANidine (ZANAFLEX) 4 MG tablet Take 4 mg by mouth 3 (three) times daily as needed for muscle spasms.    . traZODone (DESYREL) 50 MG tablet Take 150 mg by mouth at bedtime.    . triamcinolone cream (KENALOG) 0.5 % Apply 1 application topically daily as needed (for dry skin).    Marland Kitchen triamterene-hydrochlorothiazide (MAXZIDE-25) 37.5-25 MG tablet Take 0.5 tablets by mouth daily.    . celecoxib (CELEBREX) 200 MG capsule Take 200 mg by mouth daily.    Marland Kitchen  fentaNYL (DURAGESIC - DOSED MCG/HR) 25 MCG/HR patch Place 50 mcg onto the skin every other day.     . hyoscyamine (LEVSIN SL) 0.125 MG SL tablet Place 0.125 mg under the tongue every 4 (four) hours as needed for cramping.    . lidocaine (LIDODERM) 5 % Place 1-3 patches onto the skin daily as needed (for pain). Remove & Discard patch within 12 hours or as directed by MD      No results found for this or any previous visit (from the past 48 hour(s)). No results found.  Review of Systems  Constitutional: Positive for malaise/fatigue. Negative for weight loss.  HENT: Negative.   Respiratory: Negative.   Cardiovascular: Negative.   Gastrointestinal: Negative.   Genitourinary: Negative.   Musculoskeletal: Positive for back pain.  Skin: Negative.   Neurological: Positive for weakness.  Endo/Heme/Allergies:        No blood transfusion  Psychiatric/Behavioral: Negative.     Blood pressure (!) 167/103, pulse 82, temperature 98 F (36.7 C), temperature source Oral, resp. rate 18, last menstrual period 10/27/2010, SpO2 98 %. Physical Exam  Constitutional: She is oriented to person, place, and time. She appears well-developed and well-nourished.  HENT:  Head: Normocephalic and atraumatic.  Eyes: Pupils are equal, round, and reactive to light. Conjunctivae and EOM are normal.  Neck: Normal range of motion. Neck supple.  Cardiovascular: Normal rate and regular rhythm.  Respiratory: Effort normal and breath sounds normal.  GI: Soft. Bowel sounds are normal.  Musculoskeletal:  Positive straight leg raising bilaterally at 45 degrees.  Patrick's maneuver is negative bilaterally.  Patient and percussion of her back reproduces some modest tenderness.  Neurological: She is alert and oriented to person, place, and time.  Centralized back pain with radiculopathy.  Skin: Skin is warm and dry.  Psychiatric: She has a normal mood and affect. Her behavior is normal. Judgment and thought content normal.     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  History of fusion L4-5.  Plan: Anterolateral decompression and fusion L3-4 and L2-3.  Earleen Newport, MD 02/12/2018, 7:40 AM

## 2018-02-12 NOTE — Transfer of Care (Signed)
Immediate Anesthesia Transfer of Care Note  Patient: Shelley Vasquez  Procedure(s) Performed: Lumbar Two - Three Lumbar Three - Four Anterolateral lumbar decompression/fusion with lateral plate (Left Spine Lumbar)  Patient Location: PACU  Anesthesia Type:General  Level of Consciousness: awake, alert  and oriented  Airway & Oxygen Therapy: Patient Spontanous Breathing and Patient connected to nasal cannula oxygen  Post-op Assessment: Report given to RN, Post -op Vital signs reviewed and stable and Patient moving all extremities X 4  Post vital signs: Reviewed and stable  Last Vitals:  Vitals Value Taken Time  BP 154/118 02/12/2018 10:45 AM  Temp    Pulse 84 02/12/2018 10:47 AM  Resp 16 02/12/2018 10:47 AM  SpO2 100 % 02/12/2018 10:47 AM  Vitals shown include unvalidated device data.  Last Pain:  Vitals:   02/12/18 0625  TempSrc:   PainSc: 7          Complications: No apparent anesthesia complications

## 2018-02-13 ENCOUNTER — Encounter (HOSPITAL_COMMUNITY): Payer: Self-pay | Admitting: Neurological Surgery

## 2018-02-13 LAB — BASIC METABOLIC PANEL
Anion gap: 13 (ref 5–15)
BUN: 6 mg/dL (ref 6–20)
CO2: 27 mmol/L (ref 22–32)
CREATININE: 0.9 mg/dL (ref 0.44–1.00)
Calcium: 9.6 mg/dL (ref 8.9–10.3)
Chloride: 100 mmol/L (ref 98–111)
GFR calc Af Amer: >60 mL/min (ref >60)
GLUCOSE: 114 mg/dL — AB (ref 70–99)
Potassium: 3.2 mmol/L — ABNORMAL LOW (ref 3.5–5.1)
SODIUM: 140 mmol/L (ref 135–145)

## 2018-02-13 LAB — CBC
HCT: 38.7 % (ref 36.0–46.0)
Hemoglobin: 13.1 g/dL (ref 12.0–15.0)
MCH: 30.4 pg (ref 26.0–34.0)
MCHC: 33.9 g/dL (ref 30.0–36.0)
MCV: 89.8 fL (ref 78.0–100.0)
PLATELETS: 273 10*3/uL (ref 150–400)
RBC: 4.31 MIL/uL (ref 3.87–5.11)
RDW: 12.2 % (ref 11.5–15.5)
WBC: 8.3 10*3/uL (ref 4.0–10.5)

## 2018-02-13 NOTE — Evaluation (Signed)
Physical Therapy Evaluation Patient Details Name: Shelley Vasquez MRN: [id number] DOB: July 23, 1965 Today's Date: 02/13/2018   History of Present Illness  Pt admit for  Lumbar Two - Three Lumbar Three - Four Anterolateral lumbar decompression/fusion with lateral plate.PMH:  anxiety, depression, bipolar disorder,HTN, previous back surgery 2011, left knee arthroscopy 2016.   Clinical Impression  Pt admitted with above diagnosis. Pt currently with functional limitations due to the deficits listed below (see PT Problem List). Pt was able to take a few steps bed to chair withRW with min guard assist and cues.  Should progress and be able to go home with husband and caregiver.  Has 11 steps to get into home.  Will follow acutely.  Pt will benefit from skilled PT to increase their independence and safety with mobility to allow discharge to the venue listed below.     Follow Up Recommendations Home health PT;Supervision/Assistance - 24 hour    Equipment Recommendations  Other (comment)(tub bench, toilet aide)    Recommendations for Other Services       Precautions / Restrictions Precautions Precautions: Fall;Back Required Braces or Orthoses: Spinal Brace Spinal Brace: Applied in sitting position;Lumbar corset Restrictions Weight Bearing Restrictions: No      Mobility  Bed Mobility Overal bed mobility: Needs Assistance Bed Mobility: Rolling;Sidelying to Sit Rolling: Min guard Sidelying to sit: Min assist       General bed mobility comments: Slight assist for side to sit due to pain. Pt aware and follows technique for log roll.   Transfers Overall transfer level: Needs assistance Equipment used: Rolling walker (2 wheeled) Transfers: Sit to/from [facility] Sit to Stand: Min guard Stand pivot transfers: Min guard       General transfer comment: Able to stand with cues for hand placement.  min guard aisist for transfer to chair for safety.  Pt stood to put diaper on  for up to 1 min to get in place due to leakage of bladder.  Pt's knees shaky at times and slight buckling which pt appeared to control as she only needed min guard assist for standing and transfer.   Ambulation/Gait                Stairs            Wheelchair Mobility    Modified Rankin (Stroke Patients Only)       Balance Overall balance assessment: Needs assistance Sitting-balance support: No upper extremity supported;Feet supported Sitting balance-Leahy Scale: Fair     Standing balance support: Bilateral upper extremity supported;During functional activity Standing balance-Leahy Scale: Poor Standing balance comment: relies on UE support in standing for balance                             Pertinent Vitals/Pain Pain Assessment: 0-10 Pain Score: 8  Pain Location: back, neck, Migraine, headache Pain Descriptors / Indicators: Aching;Grimacing;Guarding Pain Intervention(s): Limited activity within patient's tolerance;Monitored during session;Premedicated before session;Repositioned;Patient requesting pain meds-RN notified;Heat applied(heat to neck)    Home Living Family/patient expects to be discharged to:: Private residence Living Arrangements: Spouse/significant other Available Help at Discharge: Family;Available 24 hours/day Type of Home: House Home Access: Stairs to enter Entrance Stairs-Rails: Psychiatric nurse of Steps: 11 Home Layout: Two level Home Equipment: Walker - 2 wheels;Wheelchair - [employer];Shower seat;Hand held shower head;Grab bars - tub/shower;Adaptive equipment      Prior Function Level of Independence: Independent with assistive device(s)  Comments: Used RW most of the time with Modif I. Once  pt got in shower she could shower.  Husband helped with socks.  Pt independent with toileting.        Hand Dominance   Dominant Hand: Right    Extremity/Trunk Assessment   Upper Extremity  Assessment Upper Extremity Assessment: Defer to OT evaluation    Lower Extremity Assessment Lower Extremity Assessment: Overall WFL for tasks assessed    Cervical / Trunk Assessment Cervical / Trunk Assessment: Normal  Communication   Communication: No difficulties  Cognition Arousal/Alertness: Awake/alert Behavior During Therapy: Anxious Overall Cognitive Status: Impaired/Different from baseline Area of Impairment: Memory;Following commands;Safety/judgement;Awareness;Problem solving                     Memory: Decreased short-term memory Following Commands: Follows one step commands with increased time Safety/Judgement: Decreased awareness of safety;Decreased awareness of deficits Awareness: Intellectual Problem Solving: Slow processing;Decreased initiation;Difficulty sequencing;Requires verbal cues;Requires tactile cues        General Comments General comments (skin integrity, edema, etc.): Donned brace with min assist and cues.  Pt moves slow and asked lots of questions.  Spent a lot of time making pt comfortable.     Exercises     Assessment/Plan    PT Assessment Patient needs continued PT services  PT Problem List Decreased activity tolerance;Decreased balance;Decreased mobility;Decreased safety awareness;Decreased knowledge of use of DME;Decreased knowledge of precautions;Pain;Decreased strength       PT Treatment Interventions DME instruction;Gait training;Functional mobility training;Therapeutic activities;Therapeutic exercise;Balance training;Stair training;Patient/family education    PT Goals (Current goals can be found in the Care Plan section)  Acute Rehab PT Goals Patient Stated Goal: to get around at home PT Goal Formulation: With patient Time For Goal Achievement: 02/27/18 Potential to Achieve Goals: Good    Frequency Min 5X/week   Barriers to discharge        Co-evaluation               AM-PAC PT "6 Clicks" Daily Activity   Outcome Measure Difficulty turning over in bed (including adjusting bedclothes, sheets and blankets)?: Unable Difficulty moving from lying on back to sitting on the side of the bed? : Unable Difficulty sitting down on and standing up from a chair with arms (e.g., wheelchair, bedside commode, etc,.)?: A Little Help needed moving to and from a bed to chair (including a wheelchair)?: A Little Help needed walking in hospital room?: A Lot Help needed climbing 3-5 steps with a railing? : A Lot 6 Click Score: 12    End of Session Equipment Utilized During Treatment: Gait belt Activity Tolerance: Patient limited by fatigue Patient left: in chair;with call bell/phone within reach;with family/visitor present Nurse Communication: Mobility status;Patient requests pain meds(request Robaxin for spasms) PT Visit Diagnosis: Unsteadiness on feet (R26.81);Muscle weakness (generalized) (M62.81);Pain Pain - Right/Left: Left Pain - part of body: Hip(back and neck)    Time: 3845-3646 PT Time Calculation (min) (ACUTE ONLY): 58 min   Charges:   PT Evaluation $PT Eval Moderate Complexity: 1 Mod PT Treatments $Therapeutic Activity: 23-37 mins $Self Care/Home Management: 8-22        [place] [phone] (pager)   Denice Paradise 02/13/2018, 11:20 AM

## 2018-02-13 NOTE — Progress Notes (Signed)
Patient ID: Shelley Vasquez, female   DOB: 10-13-1965, 52 y.o.   MRN: [id number] All signs are stable Motor function is doing well Pain management continues to be significant issue on full dose fentanyl PCA We will plan to switch over to patch tomorrow Add oral medication for further control Has mobilized today

## 2018-02-13 NOTE — Progress Notes (Signed)
Replaced fentanyl syringe. Wasted fentanyl 3.30cc in sink with Rolene Course, RN.

## 2018-02-13 NOTE — Consult Note (Signed)
[facility] CM Primary Care Navigator  02/13/2018  Shelley Vasquez 1965/12/29 [id number]   Met with patient, husband Shelley Vasquez) and mother Shelley Vasquez) at the bedside toidentify possible discharge needs.  Patient reportsthat she had "severe lower back pain and numbness to legs- more to the left" thathad ledto thisadmission/ surgery. (L2- L3, L3- L4 Anterolateral lumbar decompression/ fusion with lateral plate)   Patientendorses Dr. Harlan Stains with [place] [facility] herprimary care provider.    [place] obtain medications without difficulty.   Patient's husbandhas beenmanaginghermedicationsat homewithuse of "pill box" system filled once a week.  Patient'shusband has been driving and providingtransportation toherdoctors' appointments.  Patientstates that husband and mother will serve as the primary caregivers for her at home.   Anticipated plan for dischargeis homewith home health services per therapy recommendation.  Patientand husband voiced understandingto callprimarycare provider's office whenshereturnshome,for a post discharge follow-upvisitwithin1- 2 weeksor sooner if needs arise.Patient letter (with PCP's contact number) was provided asareminder.   Discussed with patient and husband regarding [facility] CM services available for health managementandresourcesat homebut indicated having no current needs or concerns at this time. Patient states that health issues are being managed so far with family's assistance. Patient and husband verbalized understandingof needto seekreferral from primary care provider to [facility] care management ifdeemed necessary and appropriatefor anyservicesin thefuture.  [facility] care management information was provided for futureneeds that patient may have.  Patient/ husbandhowever,verbally agreedand optedforEMMIcalls tofollow-up  withrecoveryat home.   Referral made for [facility] calls after discharge.   For additional questions please contact:  Edwena Felty A. Veleda Mun, BSN, RN-BC [facility] PRIMARY CARE Navigator Cell: [phone]

## 2018-02-14 MED ORDER — FENTANYL 25 MCG/HR TD PT72
75.0000 ug | MEDICATED_PATCH | TRANSDERMAL | Status: DC
  Administered 2018-02-14: 75 ug via TRANSDERMAL
  Filled 2018-02-14: qty 3

## 2018-02-14 MED ORDER — OXYCODONE-ACETAMINOPHEN 7.5-325 MG PO TABS
1.0000 | ORAL_TABLET | ORAL | Status: DC | PRN
  Administered 2018-02-14 – 2018-02-16 (×7): 1 via ORAL
  Filled 2018-02-14 (×7): qty 1

## 2018-02-14 NOTE — Care Management Note (Addendum)
Case Management Note  Patient Details  Name: Shelley Vasquez MRN: [id number] Date of Birth: 1966-05-12  Subjective/Objective:    Lumbar Decompression                Action/Plan: NCM spoke to pt, husband and mother at bedside. Offered choice for HH/list provided. [facility] unable to accept referral. Contacted [facility] for [facility] PT/OT. Will need orders for HHPT/OT with F2F. Contacted [facility] for RW for home. Has 3n1 bedside commode and shower chair at home. Delivered to room. Pt will dc to pt's mother home, Shelley Vasquez [address] [place] [address]. Updated [facility].  Expected Discharge Date:                Expected Discharge Plan:  [place]  In-House Referral:  NA  Discharge planning Services  CM Consult  Post Acute Care Choice:  Home Health Choice offered to:  Patient, Spouse  DME Arranged:  Walker rolling DME Agency:  [place] Arranged:  PT [place]:  [place]  Status of Service:  Completed, signed off  If discussed at [place] of Stay Meetings, dates discussed:    Additional Comments:  Erenest Rasher, RN 02/14/2018, 5:40 PM

## 2018-02-14 NOTE — Progress Notes (Signed)
Physical Therapy Treatment Patient Details Name: Shelley Vasquez MRN: [id number] DOB: 05-25-1966 Today's Date: 02/14/2018    History of Present Illness Pt admit for  Lumbar Two - Three Lumbar Three - Four Anterolateral lumbar decompression/fusion with lateral plate.PMH:  anxiety, depression, bipolar disorder,HTN, previous back surgery 2011, left knee arthroscopy 2016.     PT Comments    Patient seen for mobility progression. Pt requires min guard for sit to stand transfers and min A +2 for safety with gait training. Pt c/o L hip pain and bilat LE weakness. Pt recalled 2/3 precautions. Pt requires redirection to tasks and is anxious requiring increased time for all tasks. Continue to progress as tolerated.   Follow Up Recommendations  Home health PT;Supervision/Assistance - 24 hour     Equipment Recommendations  Rolling walker with 5" wheels;3in1 (PT)    Recommendations for Other Services       Precautions / Restrictions Precautions Precautions: Fall;Back Required Braces or Orthoses: Spinal Brace Spinal Brace: Applied in sitting position;Lumbar corset    Mobility  Bed Mobility Overal bed mobility: Needs Assistance Bed Mobility: Rolling;Sidelying to Sit Rolling: Supervision Sidelying to sit: Supervision       General bed mobility comments: use of rail  Transfers Overall transfer level: Needs assistance Equipment used: Rolling walker (2 wheeled) Transfers: Sit to/from Stand Sit to Stand: Min guard         General transfer comment: for safety; cues for hand placement  Ambulation/Gait Ambulation/Gait assistance: Min assist;+2 safety/equipment Gait Distance (Feet): 100 Feet Assistive device: Rolling walker (2 wheeled) Gait Pattern/deviations: Step-through pattern;Decreased step length - right;Decreased step length - left Gait velocity: slow   General Gait Details: pt with frequent changes in bilat step lengths and with bilat LE weakness; no LOB; cues for  posture/forward gaze, safe use of AD, and stride length   Stairs             Wheelchair Mobility    Modified Rankin (Stroke Patients Only)       Balance Overall balance assessment: Needs assistance Sitting-balance support: Bilateral upper extremity supported;Feet supported       Standing balance support: Bilateral upper extremity supported Standing balance-Leahy Scale: Poor Standing balance comment: reliance on RW                            Cognition Arousal/Alertness: Awake/alert Behavior During Therapy: WFL for tasks assessed/performed Overall Cognitive Status: Within Functional Limits for tasks assessed                                 General Comments: pt is anxious and tangential      Exercises      General Comments        Pertinent Vitals/Pain Pain Assessment: Faces Faces Pain Scale: Hurts a little bit Pain Location: L hip area Pain Descriptors / Indicators: Grimacing;Sore Pain Intervention(s): Monitored during session;Repositioned;Patient requesting pain meds-RN notified;RN gave pain meds during session    Home Living                      Prior Function            PT Goals (current goals can now be found in the care plan section) Progress towards PT goals: Progressing toward goals    Frequency    Min 5X/week      PT Plan Current plan  remains appropriate    Co-evaluation              AM-PAC PT "6 Clicks" Daily Activity  Outcome Measure  Difficulty turning over in bed (including adjusting bedclothes, sheets and blankets)?: Unable Difficulty moving from lying on back to sitting on the side of the bed? : A Lot Difficulty sitting down on and standing up from a chair with arms (e.g., wheelchair, bedside commode, etc,.)?: A Little Help needed moving to and from a bed to chair (including a wheelchair)?: A Little Help needed walking in hospital room?: A Little Help needed climbing 3-5 steps with a  railing? : A Lot 6 Click Score: 14    End of Session Equipment Utilized During Treatment: Gait belt Activity Tolerance: Patient tolerated treatment well Patient left: in chair;with call bell/phone within reach;with family/visitor present Nurse Communication: Mobility status PT Visit Diagnosis: Unsteadiness on feet (R26.81);Muscle weakness (generalized) (M62.81);Pain Pain - Right/Left: Left Pain - part of body: Hip     Time: 1527-1610 PT Time Calculation (min) (ACUTE ONLY): 43 min  Charges:  $Gait Training: 23-37 mins $Therapeutic Activity: 8-22 mins                     Earney Navy, PTA Pager: [phone]     Darliss Cheney 02/14/2018, 4:20 PM

## 2018-02-14 NOTE — Progress Notes (Signed)
Patient ID: Shelley Vasquez, female   DOB: [birth year], 53 y.o.   MRN: [id number] Vital signs are stable Motor function is stable Patient is slowly starting to mobilize We will DC PCA Hep well IVs

## 2018-02-14 NOTE — Progress Notes (Signed)
Occupational Therapy Evaluation late entry  Pt presents to OT with the below listed deficits.  She requires max - total A for LB ADLs, min - mod A for bed mobility, and min guard assist for functional transfers.  She is very anxious and frequently self distracts during session, requiring frequent redirection to activity.  Spouse and mother appear supportive.  She was mod I with ambulation PTA, and required assist for LB dressing.   Recommend HHOT, no DME needs identified.    02/13/18 1458  OT Visit Information  Last OT Received On 02/14/18  Assistance Needed +2 (knees buckle)  History of Present Illness Pt admit for  Lumbar Two - Three Lumbar Three - Four Anterolateral lumbar decompression/fusion with lateral plate.PMH:  anxiety, depression, bipolar disorder,HTN, previous back surgery 2011, left knee arthroscopy 2016.   Precautions  Precautions Fall;Back  Required Braces or Orthoses Spinal Brace  Spinal Brace Applied in sitting position;Lumbar corset  Restrictions  Weight Bearing Restrictions No  Home Living  Family/patient expects to be discharged to: Private residence  Living Arrangements Spouse/significant other  Available Help at Discharge Family;Available 24 hours/day  Type of Home House  Home Access Stairs to enter  Entrance Stairs-Number of Steps 11  Entrance Stairs-Rails Right;Left  Home Layout Two level;Able to live on main level with bedroom/bathroom  Alternate Level Stairs-Number of Steps 12  Alternate Level Stairs-Rails Can reach both  Bathroom Shower/Tub Tub/shower unit (garden tub)  Tax adviser - 2 wheels;Wheelchair - manual;BSC;Shower seat;Hand held shower head;Grab bars - tub/shower;Adaptive equipment  Adaptive Equipment Reacher;Long-handled shoe horn  Prior Function  Level of Independence Needs assistance  Gait / Transfers Assistance Needed ambulates with RW, uses scooter in grocery store  ADL's / [place]  spouse assists with socks and shoes   Comments Used RW most of the time with Modif I. Once  pt got in shower she could shower.  Husband helped with socks.  Pt independent with toileting.     Communication  Communication No difficulties  Pain Assessment  Pain Assessment Faces  Faces Pain Scale 6  Pain Location back, neck, Migraine, headache  Pain Descriptors / Indicators Aching;Grimacing;Guarding  Pain Intervention(s) Monitored during session;Repositioned;Limited activity within patient's tolerance  Cognition  Arousal/Alertness Awake/alert  Behavior During Therapy Anxious  Overall Cognitive Status Within Functional Limits for tasks assessed  General Comments Pt is tangential and requires frequent redirection to task  Upper Extremity Assessment  Upper Extremity Assessment Generalized weakness  Lower Extremity Assessment  Lower Extremity Assessment Defer to PT evaluation  Cervical / Trunk Assessment  Cervical / Trunk Assessment Normal  ADL  Overall ADL's  Needs assistance/impaired  Eating/Feeding Independent  Grooming Wash/dry hands;Wash/dry face;Oral care;Brushing hair;Set up;Sitting  Upper Body Bathing Set up;Supervision/ safety;Sitting  Lower Body Bathing Maximal assistance;Sit to/from stand  Upper Body Dressing  Supervision/safety;Sitting  Lower Body Dressing Total assistance;Sit to/from Retail buyer Min guard;Stand-pivot;BSC;RW  Toileting- Water quality scientist and Hygiene Maximal assistance;Sit to/from stand  Functional mobility during ADLs Min guard;Rolling walker (limited)  General ADL Comments Pt with frequent complaints, moves very slowly, discusses each move in detail before performing.  is very anxiuos with standing.  Focuses on pain, post op experience, and knees buckling   Bed Mobility  Overal bed mobility Needs Assistance  Bed Mobility Rolling;Sidelying to Sit;Sit to Sidelying  Rolling Min guard  Sidelying to sit Min assist  Sit to sidelying Mod assist   General bed mobility comments Pt able to recall  log roll technique.  She requires assist to lift trunk and to lift LEs back on the bed   Transfers  Overall transfer level Needs assistance  Equipment used Rolling walker (2 wheeled)  Transfers Sit to/from Stand  Sit to Stand Min guard  General transfer comment Pt is very anxious with standing.   She bounces and states she fears her knees will buckle.  She side stepped up EOB with no bouncing once attention was focused   Balance  Overall balance assessment Needs assistance  Sitting-balance support No upper extremity supported;Feet supported  Sitting balance-Leahy Scale Good  Standing balance support Bilateral upper extremity supported;During functional activity  Standing balance-Leahy Scale Poor  Standing balance comment relies on UE support in standing for balance  General Comments  General comments (skin integrity, edema, etc.) donned brace with min cues and supervision.  spouse and mother present  OT - End of Session  Equipment Utilized During Treatment Rolling walker;Back brace  Activity Tolerance Patient limited by pain;Other (comment) (anxiety and self distraction)  Patient left in bed;with call bell/phone within reach;with family/visitor present  Nurse Communication Mobility status  OT Assessment  OT Recommendation/Assessment Patient needs continued OT Services  OT Visit Diagnosis Unsteadiness on feet (R26.81);Pain  Pain - part of body  (back neck head)  OT Problem List Decreased strength;Decreased activity tolerance;Impaired balance (sitting and/or standing);Decreased safety awareness;Decreased knowledge of use of DME or AE;Decreased knowledge of precautions;Pain  OT Plan  OT Frequency (ACUTE ONLY) Min 2X/week  OT Treatment/Interventions (ACUTE ONLY) Self-care/ADL training;DME and/or AE instruction;Therapeutic activities;Patient/family education;Balance training  AM-PAC OT "6 Clicks" Daily Activity Outcome Measure  Help from  another person eating meals? 4  Help from another person taking care of personal grooming? 4  Help from another person toileting, which includes using toliet, bedpan, or urinal? 2  Help from another person bathing (including washing, rinsing, drying)? 2  Help from another person to put on and taking off regular upper body clothing? 3  Help from another person to put on and taking off regular lower body clothing? 1  6 Click Score 16  ADL G Code Conversion CK  OT Recommendation  Follow Up Recommendations Home health OT;Supervision/Assistance - 24 hour  OT Equipment None recommended by OT  Individuals Consulted  Consulted and Agree with Results and Recommendations Patient  Acute Rehab OT Goals  Patient Stated Goal to get better and have less knee pain and buckling   OT Goal Formulation With patient/family  Time For Goal Achievement 02/21/18  Potential to Achieve Goals Good  OT Time Calculation  OT Start Time (ACUTE ONLY) 1429  OT Stop Time (ACUTE ONLY) 1458  OT Time Calculation (min) 29 min  OT General Charges  $OT Visit 1 Visit  OT Evaluation  $OT Eval Moderate Complexity 1 Mod  OT Treatments  $Self Care/Home Management  8-22 mins  Written Expression  Dominant Hand Right   Lucille Passy, OTR/L [phone]

## 2018-02-14 NOTE — Progress Notes (Signed)
Occupational Therapy Treatment Patient Details Name: Shelley Vasquez MRN: [id number] DOB: Oct 25, 1965 Today's Date: 02/14/2018    History of present illness Pt admit for  Lumbar Two - Three Lumbar Three - Four Anterolateral lumbar decompression/fusion with lateral plate.PMH:  anxiety, depression, bipolar disorder,HTN, previous back surgery 2011, left knee arthroscopy 2016.    OT comments  Pt completed LB Adls with AE this session and has the reacher at home to complete this task. Pt educated on sock aide and pt states "maybe I should wear some socks with grib at home" pt requesting to take a late morning nap due to fatigue.   Follow Up Recommendations  Home health OT;Supervision/Assistance - 24 hour    Equipment Recommendations  None recommended by OT    Recommendations for Other Services      Precautions / Restrictions Precautions Precautions: Fall;Back Required Braces or Orthoses: Spinal Brace Spinal Brace: Applied in sitting position;Lumbar corset       Mobility Bed Mobility Overal bed mobility: Needs Assistance Bed Mobility: Rolling;Sit to Supine;Supine to Sit Rolling: Supervision   Supine to sit: Min guard Sit to supine: Min guard   General bed mobility comments: pt using bed rail with hob flat. pt able to complete transfer with incr time. pt was concerned with pulling bil le onto the bed surface but was able to complete the task without (A)  Transfers Overall transfer level: Needs assistance Equipment used: Rolling walker (2 wheeled) Transfers: Sit to/from Stand Sit to Stand: Min guard         General transfer comment: pt pulling on RW for support to complete sit to stand    Balance Overall balance assessment: Needs assistance Sitting-balance support: Bilateral upper extremity supported;Feet supported       Standing balance support: Bilateral upper extremity supported Standing balance-Leahy Scale: Poor Standing balance comment: reliance on RW                           ADL either performed or assessed with clinical judgement   ADL Overall ADL's : Needs assistance/impaired Eating/Feeding: Independent                   Lower Body Dressing: Min guard;Sit to/from stand;With adaptive equipment Lower Body Dressing Details (indicate cue type and reason): pt demonstrates reacher and sock aid this session. pt provided demonstration and min cues. pt completed task. pt reports at home walking bare foot. pt reports that she has velcro shoes at home that spouse (A) with don doff. pt plans to practice the use of reacher for strap on shoes               General ADL Comments: pt completed bed mobility and side stepping to hob with RW. pt verbalized concern for buckle of bil le but then task steps away from eob. spouse states "wait she wants you to go side ways where are you going?"     Vision   Additional Comments: wears glasses at all times    Perception     Praxis      Cognition Arousal/Alertness: Awake/alert Behavior During Therapy: WFL for tasks assessed/performed Overall Cognitive Status: Within Functional Limits for tasks assessed                                          Exercises  Shoulder Instructions       General Comments don brace x3 time sduring session. pt very fixated on making the brace low enough and tight. pt with good placement of brace but perseverating on adjustments    Pertinent Vitals/ Pain       Pain Assessment: Faces Faces Pain Scale: Hurts a little bit Pain Location: L hip area Pain Descriptors / Indicators: Grimacing;Sore Pain Intervention(s): Monitored during session;Repositioned;Premedicated before session  Home Living                                          Prior Functioning/Environment              Frequency  Min 2X/week        Progress Toward Goals  OT Goals(current goals can now be found in the care plan section)  Progress  towards OT goals: Progressing toward goals  Acute Rehab OT Goals Patient Stated Goal: to take a nap today OT Goal Formulation: With patient/family Time For Goal Achievement: 02/21/18 Potential to Achieve Goals: Good ADL Goals Pt Will Perform Grooming: with min guard assist;standing Pt Will Perform Lower Body Bathing: with min guard assist;sit to/from stand;with adaptive equipment Pt Will Perform Lower Body Dressing: with min guard assist;sit to/from stand;with adaptive equipment Pt Will Transfer to Toilet: with min guard assist;ambulating;regular height toilet;bedside commode;grab bars Pt Will Perform Toileting - Clothing Manipulation and hygiene: with min guard assist;sit to/from stand;with adaptive equipment Pt Will Perform Tub/Shower Transfer: Tub transfer;with min guard assist;ambulating;shower seat;rolling walker Additional ADL Goal #1: Pt will  independently demonstrate understanding of back precautions during ADLs  Plan Discharge plan remains appropriate    Co-evaluation                 AM-PAC PT "6 Clicks" Daily Activity     Outcome Measure   Help from another person eating meals?: None Help from another person taking care of personal grooming?: None Help from another person toileting, which includes using toliet, bedpan, or urinal?: A Little Help from another person bathing (including washing, rinsing, drying)?: A Little Help from another person to put on and taking off regular upper body clothing?: A Little Help from another person to put on and taking off regular lower body clothing?: A Little 6 Click Score: 20    End of Session Equipment Utilized During Treatment: Rolling walker;Back brace  OT Visit Diagnosis: Unsteadiness on feet (R26.81);Pain   Activity Tolerance Patient limited by fatigue(requesting to take a nap)   Patient Left in bed;with call bell/phone within reach;with family/visitor present;with bed alarm set   Nurse Communication Mobility  status;Precautions        Time: 1224-8250 OT Time Calculation (min): 21 min  Charges: OT General Charges $OT Visit: 1 Visit OT Treatments $Self Care/Home Management : 8-22 mins   Jeri Modena   OTR/L Pager: [phone] Office: [phone] .    Parke Poisson B 02/14/2018, 12:16 PM

## 2018-02-15 MED ORDER — MAGNESIUM HYDROXIDE 400 MG/5ML PO SUSP
30.0000 mL | Freq: Every day | ORAL | Status: DC
  Administered 2018-02-15 – 2018-02-16 (×2): 30 mL via ORAL
  Filled 2018-02-15 (×2): qty 30

## 2018-02-15 NOTE — Progress Notes (Signed)
Physical Therapy Treatment Patient Details Name: Shelley Vasquez MRN: [id number] DOB: 09-01-1965 Today's Date: 02/15/2018    History of Present Illness Pt admit for  Lumbar Two - Three Lumbar Three - Four Anterolateral lumbar decompression/fusion with lateral plate.PMH:  anxiety, depression, bipolar disorder,HTN, previous back surgery 2011, left knee arthroscopy 2016.     PT Comments    Patient continues to make progress toward PT goals. Patient needs to practice stairs next session.   Current plan remains appropriate.    Follow Up Recommendations  Home health PT;Supervision/Assistance - 24 hour     Equipment Recommendations  Rolling walker with 5" wheels;3in1 (PT)    Recommendations for Other Services       Precautions / Restrictions Precautions Precautions: Fall;Back Required Braces or Orthoses: Spinal Brace Spinal Brace: Applied in sitting position;Lumbar corset    Mobility  Bed Mobility Overal bed mobility: Modified Independent Bed Mobility: Rolling;Sidelying to Sit           General bed mobility comments: use of rail  Transfers Overall transfer level: Needs assistance Equipment used: Rolling walker (2 wheeled) Transfers: Sit to/from Stand Sit to Stand: Min guard         General transfer comment: for safety  Ambulation/Gait Ambulation/Gait assistance: Min assist;+2 safety/equipment;Min guard Gait Distance (Feet): 150 Feet Assistive device: Rolling walker (2 wheeled) Gait Pattern/deviations: Step-through pattern;Decreased step length - right;Decreased step length - left;Narrow base of support Gait velocity: slow   General Gait Details: pt is able to improve gait deviations with frequent cues; cues for increased cadence and stride length and heel strike/toe off; pt tends to, at times, plantarflex during swing phase and come down on forefoot   Stairs             Wheelchair Mobility    Modified Rankin (Stroke Patients Only)       Balance  Overall balance assessment: Needs assistance Sitting-balance support: Feet supported       Standing balance support: Bilateral upper extremity supported Standing balance-Leahy Scale: Poor Standing balance comment: reliance on RW                            Cognition Arousal/Alertness: Awake/alert Behavior During Therapy: WFL for tasks assessed/performed Overall Cognitive Status: Within Functional Limits for tasks assessed                                 General Comments: pt is anxious and tangential      Exercises      General Comments        Pertinent Vitals/Pain Pain Assessment: Faces Faces Pain Scale: Hurts a little bit Pain Location: L hip area Pain Descriptors / Indicators: Sore;Guarding Pain Intervention(s): Monitored during session;Premedicated before session;Repositioned    Home Living                      Prior Function            PT Goals (current goals can now be found in the care plan section) Progress towards PT goals: Progressing toward goals    Frequency    Min 5X/week      PT Plan Current plan remains appropriate    Co-evaluation              AM-PAC PT "6 Clicks" Daily Activity  Outcome Measure  Difficulty turning over in bed (including adjusting  bedclothes, sheets and blankets)?: A Lot Difficulty moving from lying on back to sitting on the side of the bed? : A Lot Difficulty sitting down on and standing up from a chair with arms (e.g., wheelchair, bedside commode, etc,.)?: A Little Help needed moving to and from a bed to chair (including a wheelchair)?: A Little Help needed walking in hospital room?: A Little Help needed climbing 3-5 steps with a railing? : A Lot 6 Click Score: 15    End of Session Equipment Utilized During Treatment: Gait belt Activity Tolerance: Patient tolerated treatment well Patient left: in chair;with call bell/phone within reach;with family/visitor present Nurse  Communication: Mobility status PT Visit Diagnosis: Unsteadiness on feet (R26.81);Muscle weakness (generalized) (M62.81);Pain Pain - Right/Left: Left Pain - part of body: Hip     Time: 4801-6553 PT Time Calculation (min) (ACUTE ONLY): 24 min  Charges:  $Gait Training: 23-37 mins                     Earney Navy, PTA Pager: [phone]     Darliss Cheney 02/15/2018, 10:52 AM

## 2018-02-15 NOTE — Care Management Important Message (Signed)
Important Message  Patient Details  Name: Shelley Vasquez MRN: [id number] Date of Birth: 24-May-1966   Medicare Important Message Given:  Yes    Orbie Pyo 02/15/2018, 3:55 PM

## 2018-02-15 NOTE — Progress Notes (Signed)
Patient ID: Shelley Vasquez, female   DOB: 02/11/1966, 52 y.o.   MRN: [id number] Vital signs are stable Pain management seems to be coming under control Continues to mobilize Encourage increased mobilization today Plan discharge for tomorrow

## 2018-02-16 MED ORDER — METHOCARBAMOL 500 MG PO TABS: 500.0000 mg | ORAL_TABLET | Freq: Four times a day (QID) | ORAL | 3 refills | Status: DC | PRN

## 2018-02-16 MED ORDER — FENTANYL 75 MCG/HR TD PT72: 75.0000 ug | MEDICATED_PATCH | TRANSDERMAL | 0 refills | Status: DC

## 2018-02-16 MED ORDER — OXYCODONE-ACETAMINOPHEN 7.5-325 MG PO TABS: 1.0000 | ORAL_TABLET | ORAL | 0 refills | Status: DC | PRN

## 2018-02-16 NOTE — Discharge Summary (Signed)
Physician Discharge Summary  Patient ID: Shelley Vasquez MRN: [id number] DOB/AGE: 10-24-1965 52 y.o.  Admit date: 02/12/2018 Discharge date: 02/16/2018  Admission Diagnoses: Lumbar spondylosis L2-3 L3-4, history of fusion L4-5, lumbar radiculopathy, neurogenic claudication  Discharge Diagnoses: Lumbar spondylosis L2-3 L3-4, history of fusion L4-5, lumbar radiculopathy, neurogenic claudication. Active Problems:   Other spondylosis with radiculopathy, lumbar region   Discharged Condition: fair  Hospital Course: Patient was admitted to undergo surgical decompression using an anterolateral technique at L2-3 and L3-4.  She tolerated this well.  She underwent posterior stabilization and she has been mobilized well.  Incisions are clean and dry.  She has been requiring pain medication the form of fentanyl patch 75 mcg in addition the Percocet 4 times a day.  She is discharged on these medications in addition to Robaxin for pain.  She has been instructed that she should not allow herself to get further constipated and use laxatives to promote bowel movement in the next day or 2  Consults: None  Significant Diagnostic Studies: None  Treatments: surgery: Anterolateral decompression L2-3 L3-4 lateral stabilization L2-L4.  Discharge Exam: Blood pressure 139/76, pulse 71, temperature 97.8 F (36.6 C), temperature source Oral, resp. rate 20, height 5\' 6"  (1.676 m), weight 97.5 kg, last menstrual period 10/27/2010, SpO2 97 %. Incision is clean and dry Station and gait are intact.  Disposition: Discharge disposition: 01-Home or Self Care       Discharge Instructions    Call MD for:  redness, tenderness, or signs of infection (pain, swelling, redness, odor or green/yellow discharge around incision site)   Complete by:  As directed    Call MD for:  severe uncontrolled pain   Complete by:  As directed    Call MD for:  temperature >100.4   Complete by:  As directed    Diet - low sodium heart  healthy   Complete by:  As directed    Discharge instructions   Complete by:  As directed    Okay to shower. Do not apply salves or appointments to incision. No heavy lifting with the upper extremities greater than 15 pounds. May resume driving when not requiring pain medication and patient feels comfortable with doing so.   Incentive spirometry RT   Complete by:  As directed    Increase activity slowly   Complete by:  As directed      Allergies as of 02/16/2018      Reactions   Nsaids Other (See Comments)   Bleeding ulcer   Prednisone Other (See Comments)   Suicidal   Prozac [fluoxetine Hcl] Anxiety, Hypertension   Lithium Other (See Comments)   Lithium poisoning    Wellbutrin [bupropion]    UNSPECIFIED REACTION    Dilaudid [hydromorphone Hcl] Other (See Comments)   Agitation   Escitalopram Oxalate Other (See Comments)   Shaking   Zolpidem Tartrate Other (See Comments)   Stay up      Medication List    STOP taking these medications   fentaNYL 25 MCG/HR patch Commonly known as:  DURAGESIC - dosed mcg/hr   fentaNYL 50 MCG/HR Commonly known as:  [place] - dosed mcg/hr Replaced by:  fentaNYL 75 MCG/HR   tiZANidine 4 MG tablet Commonly known as:  ZANAFLEX     TAKE these medications   betamethasone valerate 0.1 % cream Commonly known as:  VALISONE Apply 1 application topically daily as needed (for irritation).   celecoxib 200 MG capsule Commonly known as:  CELEBREX Take 200 mg by  mouth daily.   clonazePAM 1 MG tablet Commonly known as:  KLONOPIN Take 1 mg by mouth at bedtime.   docusate sodium 100 MG capsule Commonly known as:  COLACE Take 400 mg by mouth 2 (two) times daily.   fentaNYL 75 MCG/HR Commonly known as:  DURAGESIC - dosed mcg/hr Place 1 patch (75 mcg total) onto the skin every 3 (three) days. Start taking on:  02/17/2018 Replaces:  fentaNYL 50 MCG/HR   hyoscyamine 0.125 MG SL tablet Commonly known as:  LEVSIN SL Place 0.125 mg under the  tongue every 4 (four) hours as needed for cramping.   INTUNIV 4 MG Tb24 ER tablet Generic drug:  guanFACINE Take 4 mg by mouth daily.   lamoTRIgine 200 MG tablet Commonly known as:  LAMICTAL Take 400 mg by mouth at bedtime.   lidocaine 5 % Commonly known as:  LIDODERM Place 1-3 patches onto the skin daily as needed (for pain). Remove & Discard patch within 12 hours or as directed by MD   Magnesium 250 MG Tabs Take 500 mg by mouth 2 (two) times daily.   methocarbamol 500 MG tablet Commonly known as:  ROBAXIN Take 1 tablet (500 mg total) by mouth every 6 (six) hours as needed for muscle spasms.   oxyCODONE-acetaminophen 7.5-325 MG tablet Commonly known as:  PERCOCET Take 1 tablet by mouth every 3 (three) hours as needed for severe pain. What changed:  when to take this   pantoprazole 40 MG tablet Commonly known as:  PROTONIX Take 40 mg by mouth 2 (two) times daily.   rizatriptan 10 MG tablet Commonly known as:  MAXALT Take 10 mg by mouth as needed for migraine. May repeat in 2 hours if needed   traZODone 50 MG tablet Commonly known as:  DESYREL Take 150 mg by mouth at bedtime.   triamcinolone cream 0.5 % Commonly known as:  KENALOG Apply 1 application topically daily as needed (for dry skin).   triamterene-hydrochlorothiazide 37.5-25 MG tablet Commonly known as:  MAXZIDE-25 Take 0.5 tablets by mouth daily.            Durable Medical Equipment  (From admission, onward)         Start     Ordered   02/14/18 1521  For home use only DME Walker rolling  Once    Question:  Patient needs a walker to treat with the following condition  Answer:  Status post lumbar spine surgery for decompression of spinal cord   02/14/18 [place] Follow up.   Why:  Rolling Walker-delivered to [employer] information: [address] [place] [address] [phone]        [facility]  Follow up.   Specialty:  [place] Why:  [place] will call to arrange appt Contact information: [address] [place] [address] [phone]           Signed: Earleen Newport 02/16/2018, 3:11 PM

## 2018-02-16 NOTE — Progress Notes (Signed)
lPatient ID: Shelley Vasquez, female   DOB: 1966/06/12, 52 y.o.   MRN: [id number] Vital signs are stable Motor function has been good though patient notes numbness in the left leg particularly in the pad of the heel Clinically she is improving she is having headache now her bowels have not yet moved I have advised that she is ready for discharge and will send her home on the appropriate pain meds.

## 2018-02-16 NOTE — Progress Notes (Signed)
Physical Therapy Treatment Patient Details Name: Shelley Vasquez MRN: [id number] DOB: 12-10-1965 Today's Date: 02/16/2018    History of Present Illness Pt admit for  Lumbar Two - Three Lumbar Three - Four Anterolateral lumbar decompression/fusion with lateral plate.PMH:  anxiety, depression, bipolar disorder,HTN, previous back surgery 2011, left knee arthroscopy 2016.     PT Comments    Patient is making progress toward PT goals. Pt requires min guard/min A for functional transfers and gait/stair training. Husband and mother present. Continue to progress as tolerated.    Follow Up Recommendations  Home health PT;Supervision/Assistance - 24 hour     Equipment Recommendations  Rolling walker with 5" wheels;3in1 (PT)    Recommendations for Other Services       Precautions / Restrictions Precautions Precautions: Fall;Back Precaution Comments: able to recall 3/3 precautions Required Braces or Orthoses: Spinal Brace Spinal Brace: Applied in sitting position;Lumbar corset Restrictions Weight Bearing Restrictions: No    Mobility  Bed Mobility Overal bed mobility: Modified Independent Bed Mobility: Sidelying to Sit           General bed mobility comments: use of rail  Transfers Overall transfer level: Needs assistance Equipment used: Rolling walker (2 wheeled) Transfers: Sit to/from Stand Sit to Stand: Min guard         General transfer comment: for safety from EOB and BSC  Ambulation/Gait Ambulation/Gait assistance: Min guard;Min assist Gait Distance (Feet): 130 Feet Assistive device: Rolling walker (2 wheeled) Gait Pattern/deviations: Step-through pattern;Decreased stride length Gait velocity: slow   General Gait Details: cues for posture, increased bilat step lengths, cadence, proximity to RW, and heel strike   Stairs Stairs: Yes Stairs assistance: Min assist Stair Management: One rail Right;Step to pattern;Sideways Number of Stairs: 2 General stair  comments: cues for sequencing and technique   Wheelchair Mobility    Modified Rankin (Stroke Patients Only)       Balance Overall balance assessment: Needs assistance Sitting-balance support: Feet supported Sitting balance-Leahy Scale: Good     Standing balance support: Bilateral upper extremity supported Standing balance-Leahy Scale: Poor Standing balance comment: reliance on RW                            Cognition Arousal/Alertness: Awake/alert Behavior During Therapy: WFL for tasks assessed/performed Overall Cognitive Status: Impaired/Different from baseline(may actually be pt's baseline) Area of Impairment: Memory;Problem solving;Attention                   Current Attention Level: Selective Memory: Decreased short-term memory       Problem Solving: Slow processing;Difficulty sequencing General Comments: pt is anxious and tangential/labile      Exercises      General Comments        Pertinent Vitals/Pain Pain Assessment: Faces Faces Pain Scale: Hurts a little bit Pain Location: L hip area Pain Descriptors / Indicators: Sore;Guarding Pain Intervention(s): Monitored during session;Repositioned;Premedicated before session    Home Living                      Prior Function            PT Goals (current goals can now be found in the care plan section) Acute Rehab PT Goals Patient Stated Goal: to get home to her dog Progress towards PT goals: Progressing toward goals    Frequency    Min 5X/week      PT Plan Current plan remains appropriate  Co-evaluation PT/OT/SLP Co-Evaluation/Treatment: Yes Reason for Co-Treatment: Necessary to address cognition/behavior during functional activity;For patient/therapist safety;To address functional/ADL transfers PT goals addressed during session: Mobility/safety with mobility;Balance;Proper use of DME OT goals addressed during session: ADL's and self-care;Proper use of Adaptive  equipment and DME;Strengthening/ROM      AM-PAC PT "6 Clicks" Daily Activity  Outcome Measure  Difficulty turning over in bed (including adjusting bedclothes, sheets and blankets)?: A Lot Difficulty moving from lying on back to sitting on the side of the bed? : A Lot Difficulty sitting down on and standing up from a chair with arms (e.g., wheelchair, bedside commode, etc,.)?: Unable Help needed moving to and from a bed to chair (including a wheelchair)?: A Little Help needed walking in hospital room?: A Little Help needed climbing 3-5 steps with a railing? : A Little 6 Click Score: 14    End of Session Equipment Utilized During Treatment: Gait belt Activity Tolerance: Patient tolerated treatment well Patient left: in chair;with call bell/phone within reach;with family/visitor present Nurse Communication: Mobility status PT Visit Diagnosis: Unsteadiness on feet (R26.81);Muscle weakness (generalized) (M62.81);Pain Pain - Right/Left: Left Pain - part of body: Hip     Time: 1959-7471 PT Time Calculation (min) (ACUTE ONLY): 44 min  Charges:  $Gait Training: 23-37 mins                     Earney Navy, PTA Pager: [phone]     Darliss Cheney 02/16/2018, 3:28 PM

## 2018-02-16 NOTE — Progress Notes (Signed)
Occupational Therapy Treatment Patient Details Name: Shelley Vasquez MRN: [id number] DOB: 1965-09-25 Today's Date: 02/16/2018    History of present illness Pt admit for  Lumbar Two - Three Lumbar Three - Four Anterolateral lumbar decompression/fusion with lateral plate.PMH:  anxiety, depression, bipolar disorder,HTN, previous back surgery 2011, left knee arthroscopy 2016.    OT comments  Pt progressing towards OT goals this session. Pt was able to don underwear, ambulate to bathroom with RW, perform toilet transfer and peri care/manage LB clothing. Pt anxious throughout session and lacks confidence despite affirmation and encouragement from therapists that she is doing a great job and maintaining back precautions throughout session.  OT will continue to follow acutely, and HHOT remains appropriate for follow up. Pt at appropriate level to dc home with 24 hour supervision based off performance with OT, should she stay, next session to focus on tub transfer.    Follow Up Recommendations  Home health OT;Supervision/Assistance - 24 hour    Equipment Recommendations  None recommended by OT    Recommendations for Other Services      Precautions / Restrictions Precautions Precautions: Fall;Back Precaution Comments: able to recall 3/3 precautions Required Braces or Orthoses: Spinal Brace Spinal Brace: Applied in sitting position;Lumbar corset Restrictions Weight Bearing Restrictions: No       Mobility Bed Mobility Overal bed mobility: Modified Independent Bed Mobility: Rolling;Sidelying to Sit           General bed mobility comments: use of rail  Transfers Overall transfer level: Needs assistance Equipment used: Rolling walker (2 wheeled) Transfers: Sit to/from Stand Sit to Stand: Min guard         General transfer comment: for safety    Balance Overall balance assessment: Needs assistance Sitting-balance support: Feet supported Sitting balance-Leahy Scale: Good      Standing balance support: Bilateral upper extremity supported Standing balance-Leahy Scale: Poor Standing balance comment: reliance on RW                           ADL either performed or assessed with clinical judgement   ADL Overall ADL's : Needs assistance/impaired                     Lower Body Dressing: Moderate assistance;With caregiver independent assisting;Sit to/from stand Lower Body Dressing Details (indicate cue type and reason): to don underwear Toilet Transfer: Min guard;Ambulation;RW;Grab bars(BSC over commode)   Toileting- Clothing Manipulation and Hygiene: Supervision/safety;Sit to/from stand       Functional mobility during ADLs: Min guard;Rolling walker(increased time, re-affirmation required for confidence) General ADL Comments: Pt lacks confidence in abilities     Vision       Perception     Praxis      Cognition Arousal/Alertness: Awake/alert Behavior During Therapy: WFL for tasks assessed/performed Overall Cognitive Status: Impaired/Different from baseline Area of Impairment: Memory;Problem solving;Attention                   Current Attention Level: Selective Memory: Decreased short-term memory       Problem Solving: Slow processing;Difficulty sequencing General Comments: pt is anxious and tangential/labile        Exercises     Shoulder Instructions       General Comments      Pertinent Vitals/ Pain       Pain Assessment: Faces Faces Pain Scale: Hurts a little bit Pain Location: L hip area Pain Descriptors / Indicators: Sore;Guarding Pain Intervention(s):  Monitored during session;Repositioned  Home Living                                          Prior Functioning/Environment              Frequency  Min 2X/week        Progress Toward Goals  OT Goals(current goals can now be found in the care plan section)  Progress towards OT goals: Progressing toward goals  Acute  Rehab OT Goals Patient Stated Goal: to get home to her dog OT Goal Formulation: With patient/family Time For Goal Achievement: 02/21/18 Potential to Achieve Goals: Good  Plan Discharge plan remains appropriate    Co-evaluation    PT/OT/SLP Co-Evaluation/Treatment: Yes Reason for Co-Treatment: Necessary to address cognition/behavior during functional activity;To address functional/ADL transfers;For patient/therapist safety PT goals addressed during session: Mobility/safety with mobility;Proper use of DME;Balance OT goals addressed during session: ADL's and self-care;Proper use of Adaptive equipment and DME;Strengthening/ROM      AM-PAC PT "6 Clicks" Daily Activity     Outcome Measure   Help from another person eating meals?: None Help from another person taking care of personal grooming?: None Help from another person toileting, which includes using toliet, bedpan, or urinal?: A Little Help from another person bathing (including washing, rinsing, drying)?: A Little Help from another person to put on and taking off regular upper body clothing?: A Little Help from another person to put on and taking off regular lower body clothing?: A Little 6 Click Score: 20    End of Session Equipment Utilized During Treatment: Rolling walker;Back brace;Gait belt  OT Visit Diagnosis: Unsteadiness on feet (R26.81);Pain Pain - Right/Left: Left Pain - part of body: Hip   Activity Tolerance Patient tolerated treatment well   Patient Left in chair;with call bell/phone within reach;with family/visitor present   Nurse Communication Mobility status;Precautions        Time: 5051-8335 OT Time Calculation (min): 44 min  Charges: OT General Charges $OT Visit: 1 Visit OT Treatments $Self Care/Home Management : 8-22 mins  Hulda Humphrey OTR/L Acute Rehabilitation Services Pager: [phone] Office: [place] 02/16/2018, 2:28 PM

## 2018-02-16 NOTE — Care Management Note (Signed)
Case Management Note  Patient Details  Name: VONITA CALLOWAY MRN: [id number] Date of Birth: 1966/05/23  Subjective/Objective:                    Action/Plan: Patient discharging home today. Butch Penny with [facility] notified. Pt has transportation home.  Expected Discharge Date:  02/16/18               Expected Discharge Plan:  [place]  In-House Referral:  NA  Discharge planning Services  CM Consult  Post Acute Care Choice:  Home Health Choice offered to:  Patient, Spouse  DME Arranged:  Walker rolling DME Agency:  [place]:  PT [facility] Agency:  [place]  Status of Service:  Completed, signed off  If discussed at [place] of Stay Meetings, dates discussed:    Additional Comments:  Pollie Friar, RN 02/16/2018, 3:14 PM

## 2018-02-17 DIAGNOSIS — F319 Bipolar disorder, unspecified: Secondary | ICD-10-CM | POA: Diagnosis not present

## 2018-02-17 DIAGNOSIS — I10 Essential (primary) hypertension: Secondary | ICD-10-CM | POA: Diagnosis not present

## 2018-02-17 DIAGNOSIS — F329 Major depressive disorder, single episode, unspecified: Secondary | ICD-10-CM | POA: Diagnosis not present

## 2018-02-17 DIAGNOSIS — M4726 Other spondylosis with radiculopathy, lumbar region: Secondary | ICD-10-CM | POA: Diagnosis not present

## 2018-02-17 DIAGNOSIS — Z981 Arthrodesis status: Secondary | ICD-10-CM | POA: Diagnosis not present

## 2018-02-20 DIAGNOSIS — F410 Panic disorder [episodic paroxysmal anxiety] without agoraphobia: Secondary | ICD-10-CM | POA: Diagnosis not present

## 2018-02-20 DIAGNOSIS — F22 Delusional disorders: Secondary | ICD-10-CM | POA: Diagnosis not present

## 2018-02-20 DIAGNOSIS — F23 Brief psychotic disorder: Secondary | ICD-10-CM | POA: Diagnosis not present

## 2018-02-28 DIAGNOSIS — F410 Panic disorder [episodic paroxysmal anxiety] without agoraphobia: Secondary | ICD-10-CM | POA: Diagnosis not present

## 2018-02-28 DIAGNOSIS — F23 Brief psychotic disorder: Secondary | ICD-10-CM | POA: Diagnosis not present

## 2018-02-28 DIAGNOSIS — F22 Delusional disorders: Secondary | ICD-10-CM | POA: Diagnosis not present

## 2018-03-29 DIAGNOSIS — Z981 Arthrodesis status: Secondary | ICD-10-CM | POA: Diagnosis not present

## 2018-03-29 DIAGNOSIS — I10 Essential (primary) hypertension: Secondary | ICD-10-CM | POA: Diagnosis not present

## 2018-03-29 DIAGNOSIS — F329 Major depressive disorder, single episode, unspecified: Secondary | ICD-10-CM | POA: Diagnosis not present

## 2018-03-29 DIAGNOSIS — F319 Bipolar disorder, unspecified: Secondary | ICD-10-CM | POA: Diagnosis not present

## 2018-03-29 DIAGNOSIS — M4726 Other spondylosis with radiculopathy, lumbar region: Secondary | ICD-10-CM | POA: Diagnosis not present

## 2018-04-09 DIAGNOSIS — M545 Low back pain: Secondary | ICD-10-CM | POA: Diagnosis not present

## 2018-04-09 DIAGNOSIS — M48061 Spinal stenosis, lumbar region without neurogenic claudication: Secondary | ICD-10-CM | POA: Diagnosis not present

## 2018-04-16 DIAGNOSIS — M545 Low back pain: Secondary | ICD-10-CM | POA: Diagnosis not present

## 2018-04-16 DIAGNOSIS — M48061 Spinal stenosis, lumbar region without neurogenic claudication: Secondary | ICD-10-CM | POA: Diagnosis not present

## 2018-04-25 DIAGNOSIS — M48061 Spinal stenosis, lumbar region without neurogenic claudication: Secondary | ICD-10-CM | POA: Diagnosis not present

## 2018-04-25 DIAGNOSIS — M545 Low back pain: Secondary | ICD-10-CM | POA: Diagnosis not present

## 2018-04-26 DIAGNOSIS — M48062 Spinal stenosis, lumbar region with neurogenic claudication: Secondary | ICD-10-CM | POA: Diagnosis not present

## 2018-04-30 DIAGNOSIS — H2513 Age-related nuclear cataract, bilateral: Secondary | ICD-10-CM | POA: Diagnosis not present

## 2018-04-30 DIAGNOSIS — D352 Benign neoplasm of pituitary gland: Secondary | ICD-10-CM | POA: Diagnosis not present

## 2018-04-30 DIAGNOSIS — H40013 Open angle with borderline findings, low risk, bilateral: Secondary | ICD-10-CM | POA: Diagnosis not present

## 2018-05-01 DIAGNOSIS — G894 Chronic pain syndrome: Secondary | ICD-10-CM | POA: Diagnosis not present

## 2018-05-01 DIAGNOSIS — M545 Low back pain: Secondary | ICD-10-CM | POA: Diagnosis not present

## 2018-05-01 DIAGNOSIS — I10 Essential (primary) hypertension: Secondary | ICD-10-CM | POA: Diagnosis not present

## 2018-05-01 DIAGNOSIS — Z23 Encounter for immunization: Secondary | ICD-10-CM | POA: Diagnosis not present

## 2018-05-01 DIAGNOSIS — M48061 Spinal stenosis, lumbar region without neurogenic claudication: Secondary | ICD-10-CM | POA: Diagnosis not present

## 2018-05-07 DIAGNOSIS — M545 Low back pain: Secondary | ICD-10-CM | POA: Diagnosis not present

## 2018-05-07 DIAGNOSIS — M48061 Spinal stenosis, lumbar region without neurogenic claudication: Secondary | ICD-10-CM | POA: Diagnosis not present

## 2018-05-08 DIAGNOSIS — M47816 Spondylosis without myelopathy or radiculopathy, lumbar region: Secondary | ICD-10-CM | POA: Diagnosis not present

## 2018-05-08 DIAGNOSIS — M40204 Unspecified kyphosis, thoracic region: Secondary | ICD-10-CM | POA: Diagnosis not present

## 2018-05-08 DIAGNOSIS — G894 Chronic pain syndrome: Secondary | ICD-10-CM | POA: Diagnosis not present

## 2018-05-08 DIAGNOSIS — Z79891 Long term (current) use of opiate analgesic: Secondary | ICD-10-CM | POA: Diagnosis not present

## 2018-05-08 DIAGNOSIS — M174 Other bilateral secondary osteoarthritis of knee: Secondary | ICD-10-CM | POA: Diagnosis not present

## 2018-05-10 DIAGNOSIS — M48061 Spinal stenosis, lumbar region without neurogenic claudication: Secondary | ICD-10-CM | POA: Diagnosis not present

## 2018-05-10 DIAGNOSIS — M545 Low back pain: Secondary | ICD-10-CM | POA: Diagnosis not present

## 2018-05-16 DIAGNOSIS — M25561 Pain in right knee: Secondary | ICD-10-CM | POA: Diagnosis not present

## 2018-05-16 DIAGNOSIS — M25562 Pain in left knee: Secondary | ICD-10-CM | POA: Diagnosis not present

## 2018-05-22 DIAGNOSIS — F3111 Bipolar disorder, current episode manic without psychotic features, mild: Secondary | ICD-10-CM | POA: Diagnosis not present

## 2018-05-23 DIAGNOSIS — Z9013 Acquired absence of bilateral breasts and nipples: Secondary | ICD-10-CM | POA: Diagnosis not present

## 2018-05-24 DIAGNOSIS — M545 Low back pain: Secondary | ICD-10-CM | POA: Diagnosis not present

## 2018-05-24 DIAGNOSIS — M48061 Spinal stenosis, lumbar region without neurogenic claudication: Secondary | ICD-10-CM | POA: Diagnosis not present

## 2018-05-25 ENCOUNTER — Other Ambulatory Visit: Payer: Self-pay | Admitting: Gastroenterology

## 2018-05-25 DIAGNOSIS — R1314 Dysphagia, pharyngoesophageal phase: Secondary | ICD-10-CM | POA: Diagnosis not present

## 2018-05-25 DIAGNOSIS — Z1211 Encounter for screening for malignant neoplasm of colon: Secondary | ICD-10-CM | POA: Diagnosis not present

## 2018-05-25 DIAGNOSIS — R194 Change in bowel habit: Secondary | ICD-10-CM | POA: Diagnosis not present

## 2018-05-31 DIAGNOSIS — M545 Low back pain: Secondary | ICD-10-CM | POA: Diagnosis not present

## 2018-05-31 DIAGNOSIS — M48061 Spinal stenosis, lumbar region without neurogenic claudication: Secondary | ICD-10-CM | POA: Diagnosis not present

## 2018-06-05 DIAGNOSIS — M174 Other bilateral secondary osteoarthritis of knee: Secondary | ICD-10-CM | POA: Diagnosis not present

## 2018-06-05 DIAGNOSIS — M47816 Spondylosis without myelopathy or radiculopathy, lumbar region: Secondary | ICD-10-CM | POA: Diagnosis not present

## 2018-06-05 DIAGNOSIS — M40204 Unspecified kyphosis, thoracic region: Secondary | ICD-10-CM | POA: Diagnosis not present

## 2018-06-05 DIAGNOSIS — G894 Chronic pain syndrome: Secondary | ICD-10-CM | POA: Diagnosis not present

## 2018-06-06 DIAGNOSIS — M545 Low back pain: Secondary | ICD-10-CM | POA: Diagnosis not present

## 2018-06-06 DIAGNOSIS — M48061 Spinal stenosis, lumbar region without neurogenic claudication: Secondary | ICD-10-CM | POA: Diagnosis not present

## 2018-06-11 DIAGNOSIS — M25561 Pain in right knee: Secondary | ICD-10-CM | POA: Diagnosis not present

## 2018-06-11 DIAGNOSIS — M25562 Pain in left knee: Secondary | ICD-10-CM | POA: Diagnosis not present

## 2018-06-21 DIAGNOSIS — M25562 Pain in left knee: Secondary | ICD-10-CM | POA: Diagnosis not present

## 2018-06-25 NOTE — H&P (Signed)
History of Present Illness  General:  53/female had colonoscopy in 2005(diarrhea- was told it was related to metformin) and was advised a repeat at age 53, her maternal grandmother had colon cancer at 68 and it had metastasized. Her sister has a ?shortened colon. Prior Labs have been unremarkable for CBC and CMP. In 1996 she had ulcer related to NSAIDs. Currently she is doing will. She takes pain medications and takes stool softners 4 pills BID and drinks plenty of water 16 oz-4 bottles and reports a Bm daily, she has been having urgent diarrhea- watery stools, 2/week. She has had fecal accidents, and since she needs to be on a walker for a back surgery, L3-L4 fusion in 8/19. Denies blood in stool or black stools, she c/o cramps and lower abdominal pain diarrhea. She has lost 10 lbs in a few lbs as she gained a lot of wegiht from lack of movement and use of seroquel. She c/o difficulty swallowing, present for the past 2 years, especially with rice, she needs to take hyoscamine to relax her throat. She also c/o dry mouth. She may occasionally have pain on swallowing. Food if gets stuck causes pain on swallowing. SHe has acid reflux and she takes protonix daily, it helps, she avoids late night eating.   Current Medications  Taking   Seroquel(QUEtiapine Fumarate) 25 MG Tablet 1 tablet at bedtime Orally Once a day   Fentanyl 25 MCG/HR Patch 72 Hour 1 patch to skin Transdermal   Naloxone HCl 4 MG/0.1ML Liquid as directed Nasally as needed   Phenergan 25 mg tabs 25 MG Tablets 1 tablets Orally q 6 hrs, prn nausea   Rizatriptan Benzoate 10 MG Tablet 1 tablet as needed one time Orally May repeat after 2 hrs as needed, Notes: Listed in EPIC   Zofran ODT(Ondansetron HCl) 8 MG Tablet Dispersible 1 tablet every 8 hours as needed for nausea Orally   Protonix(Pantoprazole Sodium) 40 MG Tablet Delayed Release TAKE 1 TABLET orally twice a day   Celebrex(Celecoxib) 200 MG Capsule 1 capsule with food Orally  Once a day   Zanaflex(tiZANidine HCl) 4 MG Tablet TAKE 1 TABLET THREE TIMES DAILY AS NEEDED FOR SPASM. by mouth Three times a day   Betamethasone Valerate 0.1 % Cream 1 application to affected area Externally twice a day, Notes: DR Gaetano Net   Biofreeze(Menthol (Topical Analgesic)) 4 % Gel 1 application to affected area as needed Externally three times a day as needed   Claritin(Loratadine) 10 MG Tablet 1 tablet Orally once a day if needed   Clonazepam 1 MG Tablet 1 tablet Orally Twice a day, a 3rd tablet if needed   Docusate Sodium 100 MG Capsule Orally 4 in am and 4 in pm   Flonase Allergy Relief(Fluticasone Propionate) 50 MCG/ACT Suspension 1 spray in each nostril Nasally once a day if needed   HyoMax-SL(Hyoscyamine Sulfate SL) 0.125 MG Tablet Sublingual 1 tablet under the tongue and allow to dissolve before meals Sublingual every 4 hours as needed   Intuniv(guanFACINE HCl ER) 4 MG Tablet Extended Release 24 Hour 1 tablet Orally Once a day   Lamotrigine 200 MG Tablet 2 tablets Orally At bedtime   Lidocaine 5 % Cream as directed Externally as needed   Lidoderm(Lidocaine) 5 % Patch up to 3 patches to intact skin remove after 12 hours Externally Once a day if needed   Magnesium Oxide 250 MG Tablet 2 tablets Orally twice a day   Maxalt-MLT(Rizatriptan Benzoate) 10 MG Tablet Dispersible 1 tablet by  mouth 1 tablet as needed for headache, may repeat one time in 2 hours if needed   Maxzide(Triamterene-HCTZ) 37.5/25 Tablet 1/2 tablet by mouth every morning, may take a second half if needed for swelling   Percocet(oxyCODONE-Acetaminophen) 5-325 MG Tablet 1 tablet Orally six times daily   Trazodone HCl 50 MG Tablet 3 tablets at bedtime Orally Once a day   Triamcinolone Acetonide 0.5 % Cream 1 application sparingly to affected area Externally Three times a day as needed   Medication List reviewed and reconciled with the patient    Past Medical History  peptic ulcer disease--Dr Sammuel Cooper saw previously-->Dr  Wynetta Emery.   GERD.   Migraines.   depression/ADD--Dr Toy Care.   bipolar disorder vs major depression--DR Hoyle Barr, counselor.   chronic back pain, Lumbar DDD.   hypertension- [facility].   Obesity.   auditory processing disorder, Dr Toy Care.   sleep apnea (SPLIT 12/06/01 AHI 32/hr, O2 min 88%, CPAP 8 with AHI 22/hr - weight 280 lbs; SPLIT 08/17/07 ESS 18, AHI 15/hr, unsuccesful titration - weight 220 lbs; HST 01/03/13 AHI 8/hr, O2 min 78% (birief) - weight 213 lbs - no CPAP needed) - Dr. Maxwell Caul.   Polycystic ovarian syndrome.   Fibrocystic breast changes.   Constipation.   Varicose veins.   OA left knee, dr Latanya Maudlin.   chondromalacia patella, bilaterally, Dr. Latanya Maudlin.   PTSD--Dr Toy Care.   Prediabetes, 12/17.   pelvic floor prolapse, Dr Silas Flood.   right distal fibular avulsion fracture, 12/18, Dr Rhona Raider.    Surgical History  right toe   breast reduction, Dr. Towanda Malkin 6/11  TAH without oopherectomy, cervix remains, heavy bleeding 10/2010  Lumbar fusion, Dr. Ellene Route 8/12  bilateral arthroscopic knee surgery, Dr Latanya Maudlin 11/16  left knee arthroscopic surgery, Dr Latanya Maudlin 1/16  back surgery 01/2018   Family History  Father: deceased 72 yrs, diabetes, hypertension, bipolar with paranoid tendencies, colon polyps, pancreatic, diagnosed with Diabetes, Hypertension  Mother: alive, breast cancer 67, colon polyps, Breast cancer  Paternal [place] Father: deceased 66 yrs, CAD at 39, Coronary artery disease  Paternal Grand Mother: deceased  Maternal Grand Father: deceased  Maternal Grand Mother: deceased 25 yrs, colon cancer, cancer, uterine, Colon cancer  Sister 1: alive  Paternal uncle: CVA at 29  1 sister(s) .   GrandMother had Colon Cancer & Mother had "Benign" colon polyps.   Social History  General:  no EXPOSURE TO PASSIVE SMOKE, no.  Alcohol: yes, Rare.  Children: none.  Caffeine: yes.  Seat belt use: yes.  Tobacco use  cigarettes: Never smoked Tobacco history  last updated 05/01/2018 Religion: Jehovah's Witness.  Marital Status: married, [name], professor at [employer].  no Recreational drug use.  OCCUPATION: disability.  Exercise: minimal.    Allergies  Dilaudid: aggitated  Aspirin: ulcers  Metformin HCl: vomiting  Ambien: hyperactive   Skelaxin  NSAIDS: ulcers  Cymbalta: very depressed  Crestor: leg cramps  Lithium Carbonate: got toxic on it  Lisinopril  PredniSONE: suicidal thoughts  Meloxicam: upset stomach/nausea  Prozac: elevated pulse/shakey   Hospitalization/Major Diagnostic Procedure  admission mental issues, several times, last 2004   TAH 10/2010  lumbar surgery   breast reduction   back surgery 02/12/18   Review of Systems  CONSTITUTIONAL:  Chills No. Fatigue YES. Fever No. Insomnia YES. Night sweats No. Weight change YES.  HEENT:  Change in vision No. Double vision No . Hoarseness No. Nose bleeds No. sore throat No. Sinus Problems No. Glaucoma No.  CARDIOLOGY:  ByPass Surgery No. Poor Circulation No.  Artificial Heart Valves No. High blood pressure YES. History of Heart attack No. Irregular heart beat No. Known coronary artery disease No. Pacemaker/Defibrillator No.  RESPIRATORY:  Shortness of breath No. Cough No. Excessive Sputum No. Using Oxygen No. Asthma No. Bronchitis No. Pneumonia No. Sleep Apnea No.  UROLOGY:  Interstitials Cystitis No. Incontinence No. Blood in urine No. Difficulty urinating No. Kidney disease No. Kidney stones No.  GASTROENTEROLOGY:  Abdominal pain No. Acid reflux YES. Black stools No. Bloating/belching No. Change in bowel habits No. Constipation YES. Dark tarry stools No. Diarrhea YES. Difficulty swallowing YES. Heartburn No. Incontinence of stool YES. Indigestion: No. Lactose intolerance No. Nausea No. Rectal bleeding No. Vomiting No. Hepatitis/yellow jaundice No. History of Ulcers YES. Use of Pain Medication YES. Previous Colonoscopy YES.  MUSCULOSKELETAL:  joint pain YES. Arthritis YES. Joint  Replacement No.  NEUROLOGY:  Dizziness No. Fainting No. Headache YES. Paralysis No. Seizures No. Strokes No. Weakness No. Alzheimer's No.  SKIN:  Rash YES. Bruises easily No.  ENDOCRINOLOGY:  Diabetes No. High cholesterol No. Thyroid disorder No.  HEMATOLOGY/LYMPH:  Abnormal bleeding No. Anemia No. Enlarged lymph nodes No. Past blood transfusion No. Swollen glands No. Blood Clots No. Using Blood Thinners No.  INFECTIOUS DISEASE:  HIV/AIDS No. Tuberculosis No . Hepatitis No. Sexually Transmitted Disease No. MRSA No.  GI PROCEDURE:  no Pacemaker/ AICD, no. no Artificial heart valves. no MI/heart attack. no Abnormal heart rhythm. no Angina. no CVA. Hypertension YES. no Hypotension. no Asthma, COPD. no Sleep apnea. no Seizure disorders. no Artificial joints. Severe DJD YES. no Diabetes. Significant headaches YES. no Vertigo. Depression/anxiety YES, both. no Abnormal bleeding. no Kidney Disease. no Liver disease, no. no Chance of pregnancy. no Blood transfusion. no Method of Birth Control. no Birth control pills.  GU/GYN:  Pregnant Now No. Trying to conceive No. Use Birth Control No. Heavy Periods No.      Vital Signs  Wt 215.2, Wt change -5.8 lb, Ht 66, BMI 34.73, Temp 97.4, Pulse sitting 60, BP sitting 181/92.   Examination  Gastroenterology:: GENERAL APPEARANCE: Well developed, well nourished, no active distress, pleasant.  EYES: Lids and conjunctiva normal. Sclera normal.  ORAL CAVITY: Lips, teeth and gums are normal. Pharynx, tongue, mucosa normal .  SCLERA: anicteric .  CARDIOVASCULAR RRR no murmur, Normal RRR w/o murmers or gallops. No peripheral edema .  RESPIRATORY Breath sounds normal. Respiration even and unlabored .  ABDOMEN No masses palpated. Liver and spleen not palpated, normal. Bowel sounds normal, Abdomen not distended .  EXTREMITIES: No edema, pulses intact .  NEURO: ambulates with a walker.  PSYCH: mood/affect normal .     Assessments   1. Pharyngoesophageal  dysphagia - R13.14 (Primary)   2. Screen for colon cancer - Z12.11   3. Change in bowel habits - R19.4   Treatment  1. Pharyngoesophageal dysphagia  IMAGING: Esophagoscopy    Whitfield,Dia 05/25/2018 02:10:08 PM > spoke with Kendall-scheduled for 06/26/18-[facility]-prep instructions reviewed with pt.   Notes: DDx: peptic stricture, EOE, schatzki's ring. The risks and benefits were discussed with the patient in details. She understands and verbalizes consent. She will be given writen instructions, prescription for preparation and will be scheduled for the same. Will also obtain biopsies from small bowel to r/o celiac disease.  Referral To: Reason:endo-colon-propofol-spoke with Kendall-[facility]-#[id number]    2. Screen for colon cancer  IMAGING: Colonoscopy    Whitfield,Dia 05/25/2018 02:10:08 PM > spoke with Kendall-scheduled for 06/26/18-[facility]-prep instructions reviewed with pt.   Notes: maternal grandmother had metastatic  colon cancer in her 51s.    3. Change in bowel habits  IMAGING: Colonoscopy    Whitfield,Dia 05/25/2018 02:10:08 PM > spoke with Kendall-scheduled for 06/26/18-[facility]-prep instructions reviewed with pt.  IMAGING: Esophagoscopy    Whitfield,Dia 05/25/2018 02:10:08 PM > spoke with Kendall-scheduled for 06/26/18-[facility]-prep instructions reviewed with pt.   Notes: Will obtain biopsies from random areas of the colon to r/o microscopic colitis.

## 2018-06-26 ENCOUNTER — Other Ambulatory Visit: Payer: Self-pay

## 2018-06-26 ENCOUNTER — Encounter (HOSPITAL_COMMUNITY): Payer: Self-pay | Admitting: Gastroenterology

## 2018-06-26 ENCOUNTER — Encounter (HOSPITAL_COMMUNITY)
Admission: RE | Disposition: A | Discharge status: Home / Self Care | Payer: Self-pay | Source: Ambulatory Visit | Attending: Gastroenterology

## 2018-06-26 ENCOUNTER — Ambulatory Visit (HOSPITAL_COMMUNITY): Payer: PPO | Admitting: Anesthesiology

## 2018-06-26 ENCOUNTER — Ambulatory Visit (HOSPITAL_COMMUNITY)
Admission: RE | Admit: 2018-06-26 | Discharge: 2018-06-26 | Disposition: A | Discharge status: Home / Self Care | Payer: PPO | Source: Ambulatory Visit | Attending: Gastroenterology | Admitting: Gastroenterology

## 2018-06-26 DIAGNOSIS — Z800 Family history of malignant neoplasm of digestive organs: Secondary | ICD-10-CM | POA: Diagnosis not present

## 2018-06-26 DIAGNOSIS — Z8711 Personal history of peptic ulcer disease: Secondary | ICD-10-CM | POA: Diagnosis not present

## 2018-06-26 DIAGNOSIS — R194 Change in bowel habit: Secondary | ICD-10-CM | POA: Diagnosis not present

## 2018-06-26 DIAGNOSIS — Z79899 Other long term (current) drug therapy: Secondary | ICD-10-CM | POA: Diagnosis not present

## 2018-06-26 DIAGNOSIS — K219 Gastro-esophageal reflux disease without esophagitis: Secondary | ICD-10-CM | POA: Insufficient documentation

## 2018-06-26 DIAGNOSIS — Z1211 Encounter for screening for malignant neoplasm of colon: Secondary | ICD-10-CM | POA: Diagnosis not present

## 2018-06-26 DIAGNOSIS — K635 Polyp of colon: Secondary | ICD-10-CM | POA: Insufficient documentation

## 2018-06-26 DIAGNOSIS — R1314 Dysphagia, pharyngoesophageal phase: Secondary | ICD-10-CM | POA: Diagnosis not present

## 2018-06-26 DIAGNOSIS — Z886 Allergy status to analgesic agent status: Secondary | ICD-10-CM | POA: Diagnosis not present

## 2018-06-26 DIAGNOSIS — I10 Essential (primary) hypertension: Secondary | ICD-10-CM | POA: Diagnosis not present

## 2018-06-26 DIAGNOSIS — K3189 Other diseases of stomach and duodenum: Secondary | ICD-10-CM | POA: Insufficient documentation

## 2018-06-26 DIAGNOSIS — F329 Major depressive disorder, single episode, unspecified: Secondary | ICD-10-CM | POA: Diagnosis not present

## 2018-06-26 DIAGNOSIS — E282 Polycystic ovarian syndrome: Secondary | ICD-10-CM | POA: Insufficient documentation

## 2018-06-26 DIAGNOSIS — Z885 Allergy status to narcotic agent status: Secondary | ICD-10-CM | POA: Diagnosis not present

## 2018-06-26 DIAGNOSIS — R1310 Dysphagia, unspecified: Secondary | ICD-10-CM | POA: Diagnosis not present

## 2018-06-26 DIAGNOSIS — F4310 Post-traumatic stress disorder, unspecified: Secondary | ICD-10-CM | POA: Diagnosis not present

## 2018-06-26 DIAGNOSIS — M5136 Other intervertebral disc degeneration, lumbar region: Secondary | ICD-10-CM | POA: Diagnosis not present

## 2018-06-26 DIAGNOSIS — R7303 Prediabetes: Secondary | ICD-10-CM | POA: Insufficient documentation

## 2018-06-26 DIAGNOSIS — D122 Benign neoplasm of ascending colon: Secondary | ICD-10-CM | POA: Diagnosis not present

## 2018-06-26 DIAGNOSIS — K2950 Unspecified chronic gastritis without bleeding: Secondary | ICD-10-CM | POA: Diagnosis not present

## 2018-06-26 DIAGNOSIS — Z888 Allergy status to other drugs, medicaments and biological substances status: Secondary | ICD-10-CM | POA: Insufficient documentation

## 2018-06-26 HISTORY — PX: COLONOSCOPY: SHX5424

## 2018-06-26 HISTORY — PX: BIOPSY: SHX5522

## 2018-06-26 HISTORY — PX: ESOPHAGOGASTRODUODENOSCOPY: SHX5428

## 2018-06-26 SURGERY — EGD (ESOPHAGOGASTRODUODENOSCOPY)
Anesthesia: Monitor Anesthesia Care

## 2018-06-26 MED ORDER — PROPOFOL 10 MG/ML IV BOLUS
INTRAVENOUS | Status: AC
  Filled 2018-06-26: qty 40

## 2018-06-26 MED ORDER — ONDANSETRON HCL 4 MG/2ML IJ SOLN
INTRAMUSCULAR | Status: DC | PRN
  Administered 2018-06-26: 4 mg via INTRAVENOUS

## 2018-06-26 MED ORDER — SODIUM CHLORIDE 0.9 % IV SOLN: INTRAVENOUS | Status: DC

## 2018-06-26 MED ORDER — AMOXICILLIN-POT CLAVULANATE 875-125 MG PO TABS: 1.0000 | ORAL_TABLET | Freq: Two times a day (BID) | ORAL | 0 refills | Status: AC

## 2018-06-26 MED ORDER — PROPOFOL 500 MG/50ML IV EMUL
INTRAVENOUS | Status: DC | PRN
  Administered 2018-06-26: 150 ug/kg/min via INTRAVENOUS

## 2018-06-26 MED ORDER — LACTATED RINGERS IV SOLN
INTRAVENOUS | Status: DC
  Administered 2018-06-26: 10:00:00 via INTRAVENOUS

## 2018-06-26 MED ORDER — PROPOFOL 500 MG/50ML IV EMUL
INTRAVENOUS | Status: DC | PRN
  Administered 2018-06-26: 50 mg via INTRAVENOUS

## 2018-06-26 MED ORDER — PROPOFOL 10 MG/ML IV BOLUS
INTRAVENOUS | Status: AC
  Filled 2018-06-26: qty 20

## 2018-06-26 MED ORDER — MIDAZOLAM HCL 2 MG/2ML IJ SOLN
INTRAMUSCULAR | Status: AC
  Filled 2018-06-26: qty 2

## 2018-06-26 MED ORDER — MIDAZOLAM HCL 5 MG/5ML IJ SOLN
INTRAMUSCULAR | Status: DC | PRN
  Administered 2018-06-26: 2 mg via INTRAVENOUS

## 2018-06-26 NOTE — Op Note (Signed)
[facility] Patient Name: Shelley Vasquez Procedure Date: 06/26/2018 MRN: [id number] Attending MD: Ronnette Juniper , MD Date of Birth: 1966/06/18 CSN: [id number] Age: 53 Admit Type: Outpatient Procedure:                Colonoscopy Indications:              Last colonoscopy: 2005, Change in bowel habits,                            maternal grandmother had metastatic colon cancer Providers:                Ronnette Juniper, MD, Cleda Daub, RN, Tinnie Gens,                            Technician, Arnoldo Hooker, CRNA Referring MD:              Medicines:                Monitored Anesthesia Care Complications:            No immediate complications. Estimated blood loss:                            Minimal. Estimated Blood Loss:     Estimated blood loss was minimal. Procedure:                Pre-Anesthesia Assessment:                           - Prior to the procedure, a History and Physical                            was performed, and patient medications and                            allergies were reviewed. The patient's tolerance of                            previous anesthesia was also reviewed. The risks                            and benefits of the procedure and the sedation                            options and risks were discussed with the patient.                            All questions were answered, and informed consent                            was obtained. Prior Anticoagulants: The patient has                            taken no previous anticoagulant or antiplatelet  agents. ASA Grade Assessment: III - A patient with                            severe systemic disease. After reviewing the risks                            and benefits, the patient was deemed in                            satisfactory condition to undergo the procedure.                           - Prior to the procedure, a History and Physical                            was  performed, and patient medications and                            allergies were reviewed. The patient's tolerance of                            previous anesthesia was also reviewed. The risks                            and benefits of the procedure and the sedation                            options and risks were discussed with the patient.                            All questions were answered, and informed consent                            was obtained. Prior Anticoagulants: The patient has                            taken no previous anticoagulant or antiplatelet                            agents. ASA Grade Assessment: III - A patient with                            severe systemic disease. After reviewing the risks                            and benefits, the patient was deemed in                            satisfactory condition to undergo the procedure.                           After obtaining informed consent, the colonoscope  was passed under direct vision. Throughout the                            procedure, the patient's blood pressure, pulse, and                            oxygen saturations were monitored continuously. The                            [id number]-[id number] ([id number]) Olympus peds colonscope was                            introduced through the anus and advanced to the the                            cecum, identified by appendiceal orifice and                            ileocecal valve. The colonoscopy was performed                            without difficulty. The patient tolerated the                            procedure well. The quality of the bowel                            preparation was adequate to identify polyps 6 mm                            and larger in size. Scope In: 11:12:20 AM Scope Out: 11:34:10 AM Scope Withdrawal Time: 0 hours 15 minutes 58 seconds  Total Procedure Duration: 0 hours 21 minutes 50 seconds  Findings:       The perianal exam findings include erythematous, nodular, 1 cm area in       right buttock cheek, likely folliculitis.      A 6 mm polyp was found in the ascending colon. The polyp was sessile.       The polyp was removed with a piecemeal technique using a cold biopsy       forceps. Resection and retrieval were complete.      Biopsies for histology were taken with a cold forceps from the random       areas of right and left colon for evaluation of microscopic colitis.      The exam was otherwise without abnormality on direct and retroflexion       views. Impression:               - Erythematous nodular 1 cm in right buttock cheek,                            likely folliculitis. found on perianal exam.                           - One 6 mm polyp in the ascending colon, removed  piecemeal using a cold biopsy forceps. Resected and                            retrieved.                           - The examination was otherwise normal on direct                            and retroflexion views.                           - Biopsies were taken with a cold forceps from the                            random areas of right and left colon for evaluation                            of microscopic colitis. Moderate Sedation:      Patient did not receive moderate sedation for this procedure, but       instead received monitored anesthesia care. Recommendation:           - Patient has a contact number available for                            emergencies. The signs and symptoms of potential                            delayed complications were discussed with the                            patient. Return to normal activities tomorrow.                            Written discharge instructions were provided to the                            patient.                           - Resume regular diet.                           - Continue present medications.                           -  Await pathology results.                           - Repeat colonoscopy for surveillance based on                            pathology results.                           - Augmentin BID for 5 days for folliculitis. Procedure Code(s):        ---  Professional ---                           [id number], Colonoscopy, flexible; with biopsy, single                            or multiple Diagnosis Code(s):        --- Professional ---                           D12.2, Benign neoplasm of ascending colon                           R19.4, Change in bowel habit CPT copyright 2018 American Medical Association. All rights reserved. The codes documented in this report are preliminary and upon coder review may  be revised to meet current compliance requirements. Ronnette Juniper, MD 06/26/2018 11:47:19 AM This report has been signed electronically. Number of Addenda: 0

## 2018-06-26 NOTE — Anesthesia Postprocedure Evaluation (Signed)
Anesthesia Post Note  Patient: DOSHIE MAGGI  Procedure(s) Performed: ESOPHAGOGASTRODUODENOSCOPY (EGD) (N/A ) COLONOSCOPY (N/A ) BIOPSY     Patient location during evaluation: Endoscopy Anesthesia Type: MAC Level of consciousness: awake and alert, oriented and patient cooperative Pain management: pain level controlled Vital Signs Assessment: post-procedure vital signs reviewed and stable Respiratory status: spontaneous breathing, nonlabored ventilation and respiratory function stable Cardiovascular status: blood pressure returned to baseline and stable Postop Assessment: no apparent nausea or vomiting Anesthetic complications: no    Last Vitals:  Vitals:   06/26/18 1151 06/26/18 1200  BP: (!) 149/78 (!) 125/110  Pulse: 73 69  Resp: 17 12  Temp:    SpO2: 100% 100%    Last Pain:  Vitals:   06/26/18 1151  TempSrc:   PainSc: 0-No pain                 Paisly Fingerhut,E. Gerome Kokesh

## 2018-06-26 NOTE — Discharge Instructions (Signed)
YOU HAD AN ENDOSCOPIC PROCEDURE TODAY: Refer to the procedure report and other information in the discharge instructions given to you for any specific questions about what was found during the examination. If this information does not answer your questions, please call [place] office at [phone] to clarify.  ° °YOU SHOULD EXPECT: Some feelings of bloating in the abdomen. Passage of more gas than usual. Walking can help get rid of the air that was put into your GI tract during the procedure and reduce the bloating. If you had a lower endoscopy (such as a colonoscopy or flexible sigmoidoscopy) you may notice spotting of blood in your stool or on the toilet paper. Some abdominal soreness may be present for a day or two, also. ° °DIET: Your first meal following the procedure should be a light meal and then it is ok to progress to your normal diet. A half-sandwich or bowl of soup is an example of a good first meal. Heavy or fried foods are harder to digest and may make you feel nauseous or bloated. Drink plenty of fluids but you should avoid alcoholic beverages for 24 hours. If you had a esophageal dilation, please see attached instructions for diet.   ° °ACTIVITY: Your care partner should take you home directly after the procedure. You should plan to take it easy, moving slowly for the rest of the day. You can resume normal activity the day after the procedure however YOU SHOULD NOT DRIVE, use power tools, machinery or perform tasks that involve climbing or major physical exertion for 24 hours (because of the sedation medicines used during the test).  ° °SYMPTOMS TO REPORT IMMEDIATELY: °A gastroenterologist can be reached at any hour. Please call [phone]  for any of the following symptoms:  °Following lower endoscopy (colonoscopy, flexible sigmoidoscopy) °Excessive amounts of blood in the stool  °Significant tenderness, worsening of abdominal pains  °Swelling of the abdomen that is new, acute  °Fever of 100° or  higher  °Following upper endoscopy (EGD, EUS, ERCP, esophageal dilation) °Vomiting of blood or coffee ground material  °New, significant abdominal pain  °New, significant chest pain or pain under the shoulder blades  °Painful or persistently difficult swallowing  °New shortness of breath  °Black, tarry-looking or red, bloody stools ° °FOLLOW UP:  °If any biopsies were taken you will be contacted by phone or by letter within the next 1-3 weeks. Call [phone]  if you have not heard about the biopsies in 3 weeks.  °Please also call with any specific questions about appointments or follow up tests. ° °

## 2018-06-26 NOTE — Op Note (Signed)
[facility] Patient Name: Shelley Vasquez Procedure Date: 06/26/2018 MRN: [id number] Attending MD: Ronnette Juniper , MD Date of Birth: April 30, 1966 CSN: [id number] Age: 53 Admit Type: Outpatient Procedure:                Upper GI endoscopy Indications:              Dysphagia, change in bowel habits Providers:                Ronnette Juniper, MD, Cleda Daub, RN, Tinnie Gens,                            Technician, Arnoldo Hooker, CRNA Referring MD:              Medicines:                Monitored Anesthesia Care Complications:            No immediate complications. Estimated blood loss:                            Minimal. Estimated Blood Loss:     Estimated blood loss was minimal. Procedure:                Pre-Anesthesia Assessment:                           - Prior to the procedure, a History and Physical                            was performed, and patient medications and                            allergies were reviewed. The patient's tolerance of                            previous anesthesia was also reviewed. The risks                            and benefits of the procedure and the sedation                            options and risks were discussed with the patient.                            All questions were answered, and informed consent                            was obtained. Prior Anticoagulants: The patient has                            taken no previous anticoagulant or antiplatelet                            agents. ASA Grade Assessment: III - A patient with  severe systemic disease. After reviewing the risks                            and benefits, the patient was deemed in                            satisfactory condition to undergo the procedure.                           After obtaining informed consent, the endoscope was                            passed under direct vision. Throughout the                            procedure, the  patient's blood pressure, pulse, and                            oxygen saturations were monitored continuously. The                            GIF-H190 ([id number] Olympus adult endoscope was                            introduced through the mouth, and advanced to the                            second part of duodenum. The upper GI endoscopy was                            accomplished without difficulty. The patient                            tolerated the procedure well. Scope In: Scope Out: Findings:      No endoscopic abnormality was evident in the esophagus to explain the       patient's complaint of dysphagia. Biopsies were obtained from the       proximal and distal esophagus with cold forceps for histology of       suspected eosinophilic esophagitis.      The Z-line was regular and was found 38 cm from the incisors.      Localized mildly erythematous mucosa without bleeding was found in the       gastric antrum. Biopsies were taken with a cold forceps for Helicobacter       pylori testing.      The cardia and gastric fundus were normal on retroflexion.      The examined duodenum was normal. Biopsies for histology were taken with       a cold forceps for evaluation of celiac disease. Impression:               - No endoscopic esophageal abnormality to explain                            patient's dysphagia. Biopsied.                           -  Z-line regular, 38 cm from the incisors.                           - Erythematous mucosa in the antrum. Biopsied.                           - Normal examined duodenum. Biopsied. Moderate Sedation:      Patient did not receive moderate sedation for this procedure, but       instead received monitored anesthesia care. Recommendation:           - Patient has a contact number available for                            emergencies. The signs and symptoms of potential                            delayed complications were discussed with the                             patient. Return to normal activities tomorrow.                            Written discharge instructions were provided to the                            patient.                           - Resume regular diet.                           - Continue present medications.                           - Await pathology results. Procedure Code(s):        --- Professional ---                           [phone], Esophagogastroduodenoscopy, flexible,                            transoral; with biopsy, single or multiple Diagnosis Code(s):        --- Professional ---                           R13.10, Dysphagia, unspecified                           K31.89, Other diseases of stomach and duodenum CPT copyright 2018 [employer] Association. All rights reserved. The codes documented in this report are preliminary and upon coder review may  be revised to meet current compliance requirements. Ronnette Juniper, MD 06/26/2018 11:38:39 AM This report has been signed electronically. Number of Addenda: 0

## 2018-06-26 NOTE — Interval H&P Note (Signed)
History and Physical Interval Note: 52/female with dysphagia, family history of colon cancer(maternal grandmother), change in bowel habits for an EGD and colonoscopy. 06/26/2018 10:20 AM  Shelley Vasquez  has presented today for EGD and colonoscopy, with the diagnosis of Pharyngoesophageal dysphagia, screen for colon cancer  The various methods of treatment have been discussed with the patient and family. After consideration of risks, benefits and other options for treatment, the patient has consented to  Procedure(s): ESOPHAGOGASTRODUODENOSCOPY (EGD) (N/A) COLONOSCOPY (N/A) as a surgical intervention .  The patient's history has been reviewed, patient examined, no change in status, stable for surgery.  I have reviewed the patient's chart and labs.  Questions were answered to the patient's satisfaction.     Ronnette Juniper

## 2018-06-26 NOTE — Transfer of Care (Signed)
Immediate Anesthesia Transfer of Care Note  Patient: Shelley Vasquez  Procedure(s) Performed: Procedure(s): ESOPHAGOGASTRODUODENOSCOPY (EGD) (N/A) COLONOSCOPY (N/A) BIOPSY  Patient Location: PACU  Anesthesia Type:MAC  Level of Consciousness:  sedated, patient cooperative and responds to stimulation  Airway & Oxygen Therapy:Patient Spontanous Breathing and Patient connected to face mask oxgen  Post-op Assessment:  Report given to PACU RN and Post -op Vital signs reviewed and stable  Post vital signs:  Reviewed and stable  Last Vitals:  Vitals:   06/26/18 1007  BP: (!) 166/95  Pulse: 82  Resp: 17  Temp: 36.8 C  SpO2: 183%    Complications: No apparent anesthesia complications

## 2018-06-26 NOTE — Anesthesia Preprocedure Evaluation (Addendum)
Anesthesia Evaluation  Patient identified by MRN, date of birth, ID band Patient awake    Reviewed: Allergy & Precautions, NPO status , Patient's Chart, lab work & pertinent test results  History of Anesthesia Complications (+) PROLONGED EMERGENCE  Airway Mallampati: II  TM Distance: >3 FB Neck ROM: Full    Dental  (+) Dental Advisory Given   Pulmonary sleep apnea (does not use CPAP) ,    breath sounds clear to auscultation       Cardiovascular hypertension, Pt. on medications (-) angina Rhythm:Regular Rate:Normal     Neuro/Psych Anxiety Depression Bipolar Disorder Chronic back pain: narcotics    GI/Hepatic GERD  Medicated,(+)     substance abuse  ,   Endo/Other  negative endocrine ROS  Renal/GU negative Renal ROS     Musculoskeletal  (+) narcotic dependent  Abdominal (+) + obese,   Peds  Hematology  (+) [facility] (does not accept any blood products, will accept cell-saver, unsure about albumin)  Anesthesia Other Findings   Reproductive/Obstetrics S/p hysterectomy                            Anesthesia Physical Anesthesia Plan  ASA: III  Anesthesia Plan: MAC   Post-op Pain Management:    Induction:   PONV Risk Score and Plan: 2 and Treatment may vary due to age or medical condition  Airway Management Planned: Natural Airway and Nasal Cannula  Additional Equipment:   Intra-op Plan:   Post-operative Plan:   Informed Consent: I have reviewed the patients History and Physical, chart, labs and discussed the procedure including the risks, benefits and alternatives for the proposed anesthesia with the patient or authorized representative who has indicated his/her understanding and acceptance.   Dental advisory given  Plan Discussed with: CRNA and Surgeon  Anesthesia Plan Comments:         Anesthesia Quick Evaluation

## 2018-06-26 NOTE — Brief Op Note (Signed)
06/26/2018  11:47 AM  PATIENT:  Carolyn Stare  53 y.o. female  PRE-OPERATIVE DIAGNOSIS:  Pharyngoesophageal dysphagia, screen for colon cancer  POST-OPERATIVE DIAGNOSIS:  biopsy/ciolon polyp  PROCEDURE:  Procedure(s): ESOPHAGOGASTRODUODENOSCOPY (EGD) (N/A) COLONOSCOPY (N/A) BIOPSY  SURGEON:  Surgeon(s) and Role:    Ronnette Juniper, MD - Primary  PHYSICIAN ASSISTANT:   ASSISTANTS: Cleda Daub, RN, Tinnie Gens, Tech   ANESTHESIA:   MAC  EBL:  Minimal  BLOOD ADMINISTERED:none  DRAINS: none   LOCAL MEDICATIONS USED:  NONE  SPECIMEN:  Biopsy / Limited Resection  DISPOSITION OF SPECIMEN:  PATHOLOGY  COUNTS:  YES  TOURNIQUET:  * No tourniquets in log *  DICTATION: .Dragon Dictation  PLAN OF CARE: Discharge to home after PACU  PATIENT DISPOSITION:  PACU - hemodynamically stable.   Delay start of Pharmacological VTE agent (>24hrs) due to surgical blood loss or risk of bleeding: no

## 2018-06-28 ENCOUNTER — Encounter (HOSPITAL_COMMUNITY): Payer: Self-pay | Admitting: Gastroenterology

## 2018-06-28 DIAGNOSIS — M545 Low back pain: Secondary | ICD-10-CM | POA: Diagnosis not present

## 2018-06-28 DIAGNOSIS — M48061 Spinal stenosis, lumbar region without neurogenic claudication: Secondary | ICD-10-CM | POA: Diagnosis not present

## 2018-07-02 DIAGNOSIS — I10 Essential (primary) hypertension: Secondary | ICD-10-CM | POA: Diagnosis not present

## 2018-07-02 DIAGNOSIS — L0231 Cutaneous abscess of buttock: Secondary | ICD-10-CM | POA: Diagnosis not present

## 2018-07-03 DIAGNOSIS — F4321 Adjustment disorder with depressed mood: Secondary | ICD-10-CM | POA: Diagnosis not present

## 2018-07-04 DIAGNOSIS — M47816 Spondylosis without myelopathy or radiculopathy, lumbar region: Secondary | ICD-10-CM | POA: Diagnosis not present

## 2018-07-04 DIAGNOSIS — M174 Other bilateral secondary osteoarthritis of knee: Secondary | ICD-10-CM | POA: Diagnosis not present

## 2018-07-04 DIAGNOSIS — M40204 Unspecified kyphosis, thoracic region: Secondary | ICD-10-CM | POA: Diagnosis not present

## 2018-07-04 DIAGNOSIS — G894 Chronic pain syndrome: Secondary | ICD-10-CM | POA: Diagnosis not present

## 2018-07-05 DIAGNOSIS — M48061 Spinal stenosis, lumbar region without neurogenic claudication: Secondary | ICD-10-CM | POA: Diagnosis not present

## 2018-07-05 DIAGNOSIS — M545 Low back pain: Secondary | ICD-10-CM | POA: Diagnosis not present

## 2018-07-09 DIAGNOSIS — M25561 Pain in right knee: Secondary | ICD-10-CM | POA: Diagnosis not present

## 2018-07-10 DIAGNOSIS — M48061 Spinal stenosis, lumbar region without neurogenic claudication: Secondary | ICD-10-CM | POA: Diagnosis not present

## 2018-07-10 DIAGNOSIS — M545 Low back pain: Secondary | ICD-10-CM | POA: Diagnosis not present

## 2018-07-11 DIAGNOSIS — R1314 Dysphagia, pharyngoesophageal phase: Secondary | ICD-10-CM | POA: Diagnosis not present

## 2018-07-11 DIAGNOSIS — Z09 Encounter for follow-up examination after completed treatment for conditions other than malignant neoplasm: Secondary | ICD-10-CM | POA: Diagnosis not present

## 2018-07-11 DIAGNOSIS — R194 Change in bowel habit: Secondary | ICD-10-CM | POA: Diagnosis not present

## 2018-07-12 ENCOUNTER — Other Ambulatory Visit (HOSPITAL_COMMUNITY)
Admission: RE | Admit: 2018-07-12 | Discharge: 2018-07-12 | Disposition: A | Discharge status: Home / Self Care | Payer: PPO | Source: Ambulatory Visit | Attending: Family Medicine | Admitting: Family Medicine

## 2018-07-12 ENCOUNTER — Other Ambulatory Visit: Payer: Self-pay | Admitting: Family Medicine

## 2018-07-12 DIAGNOSIS — G894 Chronic pain syndrome: Secondary | ICD-10-CM | POA: Diagnosis not present

## 2018-07-12 DIAGNOSIS — Z79899 Other long term (current) drug therapy: Secondary | ICD-10-CM | POA: Diagnosis not present

## 2018-07-12 DIAGNOSIS — Z0000 Encounter for general adult medical examination without abnormal findings: Secondary | ICD-10-CM | POA: Diagnosis not present

## 2018-07-12 DIAGNOSIS — I10 Essential (primary) hypertension: Secondary | ICD-10-CM | POA: Diagnosis not present

## 2018-07-12 DIAGNOSIS — F319 Bipolar disorder, unspecified: Secondary | ICD-10-CM | POA: Diagnosis not present

## 2018-07-12 DIAGNOSIS — Z124 Encounter for screening for malignant neoplasm of cervix: Secondary | ICD-10-CM | POA: Diagnosis not present

## 2018-07-12 DIAGNOSIS — E785 Hyperlipidemia, unspecified: Secondary | ICD-10-CM | POA: Diagnosis not present

## 2018-07-13 DIAGNOSIS — Z79899 Other long term (current) drug therapy: Secondary | ICD-10-CM | POA: Diagnosis not present

## 2018-07-13 DIAGNOSIS — I10 Essential (primary) hypertension: Secondary | ICD-10-CM | POA: Diagnosis not present

## 2018-07-13 DIAGNOSIS — F4321 Adjustment disorder with depressed mood: Secondary | ICD-10-CM | POA: Diagnosis not present

## 2018-07-13 DIAGNOSIS — E785 Hyperlipidemia, unspecified: Secondary | ICD-10-CM | POA: Diagnosis not present

## 2018-07-17 DIAGNOSIS — M48061 Spinal stenosis, lumbar region without neurogenic claudication: Secondary | ICD-10-CM | POA: Diagnosis not present

## 2018-07-17 DIAGNOSIS — M545 Low back pain: Secondary | ICD-10-CM | POA: Diagnosis not present

## 2018-07-19 DIAGNOSIS — M545 Low back pain: Secondary | ICD-10-CM | POA: Diagnosis not present

## 2018-07-19 DIAGNOSIS — M48061 Spinal stenosis, lumbar region without neurogenic claudication: Secondary | ICD-10-CM | POA: Diagnosis not present

## 2018-07-19 LAB — CYTOLOGY - PAP
Diagnosis: NEGATIVE
HPV (WINDOPATH): NOT DETECTED

## 2018-07-24 DIAGNOSIS — M48061 Spinal stenosis, lumbar region without neurogenic claudication: Secondary | ICD-10-CM | POA: Diagnosis not present

## 2018-07-24 DIAGNOSIS — M545 Low back pain: Secondary | ICD-10-CM | POA: Diagnosis not present

## 2018-08-02 DIAGNOSIS — M48061 Spinal stenosis, lumbar region without neurogenic claudication: Secondary | ICD-10-CM | POA: Diagnosis not present

## 2018-08-02 DIAGNOSIS — M545 Low back pain: Secondary | ICD-10-CM | POA: Diagnosis not present

## 2018-08-07 DIAGNOSIS — M48061 Spinal stenosis, lumbar region without neurogenic claudication: Secondary | ICD-10-CM | POA: Diagnosis not present

## 2018-08-07 DIAGNOSIS — M545 Low back pain: Secondary | ICD-10-CM | POA: Diagnosis not present

## 2018-08-15 DIAGNOSIS — G894 Chronic pain syndrome: Secondary | ICD-10-CM | POA: Diagnosis not present

## 2018-08-15 DIAGNOSIS — M47816 Spondylosis without myelopathy or radiculopathy, lumbar region: Secondary | ICD-10-CM | POA: Diagnosis not present

## 2018-08-15 DIAGNOSIS — M174 Other bilateral secondary osteoarthritis of knee: Secondary | ICD-10-CM | POA: Diagnosis not present

## 2018-08-15 DIAGNOSIS — M40204 Unspecified kyphosis, thoracic region: Secondary | ICD-10-CM | POA: Diagnosis not present

## 2018-08-21 DIAGNOSIS — I10 Essential (primary) hypertension: Secondary | ICD-10-CM | POA: Diagnosis not present

## 2018-08-21 DIAGNOSIS — J029 Acute pharyngitis, unspecified: Secondary | ICD-10-CM | POA: Diagnosis not present

## 2018-08-21 DIAGNOSIS — J329 Chronic sinusitis, unspecified: Secondary | ICD-10-CM | POA: Diagnosis not present

## 2018-08-28 ENCOUNTER — Other Ambulatory Visit: Payer: Self-pay | Admitting: Family Medicine

## 2018-08-28 DIAGNOSIS — Z1231 Encounter for screening mammogram for malignant neoplasm of breast: Secondary | ICD-10-CM

## 2018-09-12 DIAGNOSIS — Z09 Encounter for follow-up examination after completed treatment for conditions other than malignant neoplasm: Secondary | ICD-10-CM | POA: Diagnosis not present

## 2018-09-13 DIAGNOSIS — I10 Essential (primary) hypertension: Secondary | ICD-10-CM | POA: Diagnosis not present

## 2018-09-13 DIAGNOSIS — E785 Hyperlipidemia, unspecified: Secondary | ICD-10-CM | POA: Diagnosis not present

## 2018-09-17 DIAGNOSIS — M5136 Other intervertebral disc degeneration, lumbar region: Secondary | ICD-10-CM | POA: Diagnosis not present

## 2018-09-17 DIAGNOSIS — G894 Chronic pain syndrome: Secondary | ICD-10-CM | POA: Diagnosis not present

## 2018-09-17 DIAGNOSIS — I10 Essential (primary) hypertension: Secondary | ICD-10-CM | POA: Diagnosis not present

## 2018-09-17 DIAGNOSIS — F319 Bipolar disorder, unspecified: Secondary | ICD-10-CM | POA: Diagnosis not present

## 2018-09-26 DIAGNOSIS — E7800 Pure hypercholesterolemia, unspecified: Secondary | ICD-10-CM | POA: Diagnosis not present

## 2018-09-26 DIAGNOSIS — N400 Enlarged prostate without lower urinary tract symptoms: Secondary | ICD-10-CM | POA: Diagnosis not present

## 2018-09-26 DIAGNOSIS — Z95828 Presence of other vascular implants and grafts: Secondary | ICD-10-CM | POA: Diagnosis not present

## 2018-09-26 DIAGNOSIS — Z23 Encounter for immunization: Secondary | ICD-10-CM | POA: Diagnosis not present

## 2018-09-26 DIAGNOSIS — E782 Mixed hyperlipidemia: Secondary | ICD-10-CM | POA: Diagnosis not present

## 2018-09-26 DIAGNOSIS — Z79899 Other long term (current) drug therapy: Secondary | ICD-10-CM | POA: Diagnosis not present

## 2018-09-26 DIAGNOSIS — I639 Cerebral infarction, unspecified: Secondary | ICD-10-CM | POA: Diagnosis not present

## 2018-09-26 DIAGNOSIS — K5730 Diverticulosis of large intestine without perforation or abscess without bleeding: Secondary | ICD-10-CM | POA: Diagnosis not present

## 2018-09-26 DIAGNOSIS — K219 Gastro-esophageal reflux disease without esophagitis: Secondary | ICD-10-CM | POA: Diagnosis not present

## 2018-09-26 DIAGNOSIS — L719 Rosacea, unspecified: Secondary | ICD-10-CM | POA: Diagnosis not present

## 2018-09-26 DIAGNOSIS — R252 Cramp and spasm: Secondary | ICD-10-CM | POA: Diagnosis not present

## 2018-09-26 DIAGNOSIS — N183 Chronic kidney disease, stage 3 unspecified: Secondary | ICD-10-CM | POA: Diagnosis not present

## 2018-09-26 DIAGNOSIS — M174 Other bilateral secondary osteoarthritis of knee: Secondary | ICD-10-CM | POA: Diagnosis not present

## 2018-09-26 DIAGNOSIS — I129 Hypertensive chronic kidney disease with stage 1 through stage 4 chronic kidney disease, or unspecified chronic kidney disease: Secondary | ICD-10-CM | POA: Diagnosis not present

## 2018-09-26 DIAGNOSIS — M47816 Spondylosis without myelopathy or radiculopathy, lumbar region: Secondary | ICD-10-CM | POA: Diagnosis not present

## 2018-09-26 DIAGNOSIS — E039 Hypothyroidism, unspecified: Secondary | ICD-10-CM | POA: Diagnosis not present

## 2018-09-26 DIAGNOSIS — M792 Neuralgia and neuritis, unspecified: Secondary | ICD-10-CM | POA: Diagnosis not present

## 2018-09-26 DIAGNOSIS — I25110 Atherosclerotic heart disease of native coronary artery with unstable angina pectoris: Secondary | ICD-10-CM | POA: Diagnosis not present

## 2018-09-26 DIAGNOSIS — Z880 Allergy status to penicillin: Secondary | ICD-10-CM | POA: Diagnosis not present

## 2018-09-26 DIAGNOSIS — M1990 Unspecified osteoarthritis, unspecified site: Secondary | ICD-10-CM | POA: Diagnosis not present

## 2018-09-26 DIAGNOSIS — Z8249 Family history of ischemic heart disease and other diseases of the circulatory system: Secondary | ICD-10-CM | POA: Diagnosis not present

## 2018-09-26 DIAGNOSIS — J302 Other seasonal allergic rhinitis: Secondary | ICD-10-CM | POA: Diagnosis not present

## 2018-09-26 DIAGNOSIS — Z7982 Long term (current) use of aspirin: Secondary | ICD-10-CM | POA: Diagnosis not present

## 2018-09-26 DIAGNOSIS — Z7902 Long term (current) use of antithrombotics/antiplatelets: Secondary | ICD-10-CM | POA: Diagnosis not present

## 2018-09-26 DIAGNOSIS — R531 Weakness: Secondary | ICD-10-CM | POA: Diagnosis not present

## 2018-09-26 DIAGNOSIS — I868 Varicose veins of other specified sites: Secondary | ICD-10-CM | POA: Diagnosis not present

## 2018-09-26 DIAGNOSIS — E785 Hyperlipidemia, unspecified: Secondary | ICD-10-CM | POA: Diagnosis not present

## 2018-09-26 DIAGNOSIS — I2584 Coronary atherosclerosis due to calcified coronary lesion: Secondary | ICD-10-CM | POA: Diagnosis not present

## 2018-09-26 DIAGNOSIS — R7889 Finding of other specified substances, not normally found in blood: Secondary | ICD-10-CM | POA: Diagnosis not present

## 2018-09-26 DIAGNOSIS — H906 Mixed conductive and sensorineural hearing loss, bilateral: Secondary | ICD-10-CM | POA: Diagnosis not present

## 2018-09-26 DIAGNOSIS — G894 Chronic pain syndrome: Secondary | ICD-10-CM | POA: Diagnosis not present

## 2018-09-26 DIAGNOSIS — I480 Paroxysmal atrial fibrillation: Secondary | ICD-10-CM | POA: Diagnosis not present

## 2018-09-26 DIAGNOSIS — M150 Primary generalized (osteo)arthritis: Secondary | ICD-10-CM | POA: Diagnosis not present

## 2018-09-26 DIAGNOSIS — C3492 Malignant neoplasm of unspecified part of left bronchus or lung: Secondary | ICD-10-CM | POA: Diagnosis not present

## 2018-09-26 DIAGNOSIS — I10 Essential (primary) hypertension: Secondary | ICD-10-CM | POA: Diagnosis not present

## 2018-09-26 DIAGNOSIS — F430 Acute stress reaction: Secondary | ICD-10-CM | POA: Diagnosis not present

## 2018-09-26 DIAGNOSIS — D692 Other nonthrombocytopenic purpura: Secondary | ICD-10-CM | POA: Diagnosis not present

## 2018-09-26 DIAGNOSIS — C50111 Malignant neoplasm of central portion of right female breast: Secondary | ICD-10-CM | POA: Diagnosis not present

## 2018-09-26 DIAGNOSIS — R9431 Abnormal electrocardiogram [ECG] [EKG]: Secondary | ICD-10-CM | POA: Diagnosis not present

## 2018-09-26 DIAGNOSIS — M40204 Unspecified kyphosis, thoracic region: Secondary | ICD-10-CM | POA: Diagnosis not present

## 2018-09-26 DIAGNOSIS — Z0000 Encounter for general adult medical examination without abnormal findings: Secondary | ICD-10-CM | POA: Diagnosis not present

## 2018-09-26 DIAGNOSIS — Z87891 Personal history of nicotine dependence: Secondary | ICD-10-CM | POA: Diagnosis not present

## 2018-10-01 DIAGNOSIS — F331 Major depressive disorder, recurrent, moderate: Secondary | ICD-10-CM | POA: Diagnosis not present

## 2018-10-01 DIAGNOSIS — F3342 Major depressive disorder, recurrent, in full remission: Secondary | ICD-10-CM | POA: Diagnosis not present

## 2018-10-17 DIAGNOSIS — E785 Hyperlipidemia, unspecified: Secondary | ICD-10-CM | POA: Diagnosis not present

## 2018-10-17 DIAGNOSIS — I10 Essential (primary) hypertension: Secondary | ICD-10-CM | POA: Diagnosis not present

## 2018-10-17 DIAGNOSIS — M1712 Unilateral primary osteoarthritis, left knee: Secondary | ICD-10-CM | POA: Diagnosis not present

## 2018-10-22 DIAGNOSIS — N952 Postmenopausal atrophic vaginitis: Secondary | ICD-10-CM | POA: Diagnosis not present

## 2018-10-22 DIAGNOSIS — F319 Bipolar disorder, unspecified: Secondary | ICD-10-CM | POA: Diagnosis not present

## 2018-10-22 DIAGNOSIS — E669 Obesity, unspecified: Secondary | ICD-10-CM | POA: Diagnosis not present

## 2018-10-22 DIAGNOSIS — M25552 Pain in left hip: Secondary | ICD-10-CM | POA: Diagnosis not present

## 2018-10-22 DIAGNOSIS — M5136 Other intervertebral disc degeneration, lumbar region: Secondary | ICD-10-CM | POA: Diagnosis not present

## 2018-10-22 DIAGNOSIS — I10 Essential (primary) hypertension: Secondary | ICD-10-CM | POA: Diagnosis not present

## 2018-10-22 DIAGNOSIS — M1712 Unilateral primary osteoarthritis, left knee: Secondary | ICD-10-CM | POA: Diagnosis not present

## 2018-10-22 DIAGNOSIS — J301 Allergic rhinitis due to pollen: Secondary | ICD-10-CM | POA: Diagnosis not present

## 2018-10-30 DIAGNOSIS — N62 Hypertrophy of breast: Secondary | ICD-10-CM | POA: Diagnosis not present

## 2018-11-16 DIAGNOSIS — F3342 Major depressive disorder, recurrent, in full remission: Secondary | ICD-10-CM | POA: Diagnosis not present

## 2018-11-21 DIAGNOSIS — G894 Chronic pain syndrome: Secondary | ICD-10-CM | POA: Diagnosis not present

## 2018-11-21 DIAGNOSIS — M174 Other bilateral secondary osteoarthritis of knee: Secondary | ICD-10-CM | POA: Diagnosis not present

## 2018-11-21 DIAGNOSIS — M47816 Spondylosis without myelopathy or radiculopathy, lumbar region: Secondary | ICD-10-CM | POA: Diagnosis not present

## 2018-11-21 DIAGNOSIS — M40204 Unspecified kyphosis, thoracic region: Secondary | ICD-10-CM | POA: Diagnosis not present

## 2018-11-28 DIAGNOSIS — I10 Essential (primary) hypertension: Secondary | ICD-10-CM | POA: Diagnosis not present

## 2018-11-28 DIAGNOSIS — F419 Anxiety disorder, unspecified: Secondary | ICD-10-CM | POA: Diagnosis not present

## 2018-11-28 DIAGNOSIS — R0789 Other chest pain: Secondary | ICD-10-CM | POA: Diagnosis not present

## 2018-11-28 DIAGNOSIS — R490 Dysphonia: Secondary | ICD-10-CM | POA: Diagnosis not present

## 2018-11-28 DIAGNOSIS — M2550 Pain in unspecified joint: Secondary | ICD-10-CM | POA: Diagnosis not present

## 2018-12-12 DIAGNOSIS — I10 Essential (primary) hypertension: Secondary | ICD-10-CM | POA: Diagnosis not present

## 2018-12-12 DIAGNOSIS — M1712 Unilateral primary osteoarthritis, left knee: Secondary | ICD-10-CM | POA: Diagnosis not present

## 2018-12-12 DIAGNOSIS — E785 Hyperlipidemia, unspecified: Secondary | ICD-10-CM | POA: Diagnosis not present

## 2018-12-31 ENCOUNTER — Other Ambulatory Visit: Payer: Self-pay

## 2018-12-31 ENCOUNTER — Ambulatory Visit
Admission: RE | Admit: 2018-12-31 | Discharge: 2018-12-31 | Disposition: A | Discharge status: Home / Self Care | Payer: PPO | Source: Ambulatory Visit | Attending: Family Medicine | Admitting: Family Medicine

## 2018-12-31 DIAGNOSIS — Z1231 Encounter for screening mammogram for malignant neoplasm of breast: Secondary | ICD-10-CM | POA: Diagnosis not present

## 2019-01-02 DIAGNOSIS — M47816 Spondylosis without myelopathy or radiculopathy, lumbar region: Secondary | ICD-10-CM | POA: Diagnosis not present

## 2019-01-11 DIAGNOSIS — F4322 Adjustment disorder with anxiety: Secondary | ICD-10-CM | POA: Diagnosis not present

## 2019-01-16 DIAGNOSIS — M40204 Unspecified kyphosis, thoracic region: Secondary | ICD-10-CM | POA: Diagnosis not present

## 2019-01-16 DIAGNOSIS — M174 Other bilateral secondary osteoarthritis of knee: Secondary | ICD-10-CM | POA: Diagnosis not present

## 2019-01-16 DIAGNOSIS — Z79891 Long term (current) use of opiate analgesic: Secondary | ICD-10-CM | POA: Diagnosis not present

## 2019-01-16 DIAGNOSIS — G894 Chronic pain syndrome: Secondary | ICD-10-CM | POA: Diagnosis not present

## 2019-01-16 DIAGNOSIS — M47816 Spondylosis without myelopathy or radiculopathy, lumbar region: Secondary | ICD-10-CM | POA: Diagnosis not present

## 2019-01-21 DIAGNOSIS — I10 Essential (primary) hypertension: Secondary | ICD-10-CM | POA: Diagnosis not present

## 2019-01-21 DIAGNOSIS — G894 Chronic pain syndrome: Secondary | ICD-10-CM | POA: Diagnosis not present

## 2019-01-21 DIAGNOSIS — F4322 Adjustment disorder with anxiety: Secondary | ICD-10-CM | POA: Diagnosis not present

## 2019-01-21 DIAGNOSIS — F319 Bipolar disorder, unspecified: Secondary | ICD-10-CM | POA: Diagnosis not present

## 2019-01-29 DIAGNOSIS — E785 Hyperlipidemia, unspecified: Secondary | ICD-10-CM | POA: Diagnosis not present

## 2019-01-29 DIAGNOSIS — I10 Essential (primary) hypertension: Secondary | ICD-10-CM | POA: Diagnosis not present

## 2019-01-29 DIAGNOSIS — M1712 Unilateral primary osteoarthritis, left knee: Secondary | ICD-10-CM | POA: Diagnosis not present

## 2019-02-04 DIAGNOSIS — F3342 Major depressive disorder, recurrent, in full remission: Secondary | ICD-10-CM | POA: Diagnosis not present

## 2019-02-11 DIAGNOSIS — F4322 Adjustment disorder with anxiety: Secondary | ICD-10-CM | POA: Diagnosis not present

## 2019-03-04 DIAGNOSIS — F4322 Adjustment disorder with anxiety: Secondary | ICD-10-CM | POA: Diagnosis not present

## 2019-03-08 DIAGNOSIS — L989 Disorder of the skin and subcutaneous tissue, unspecified: Secondary | ICD-10-CM | POA: Diagnosis not present

## 2019-03-11 DIAGNOSIS — L239 Allergic contact dermatitis, unspecified cause: Secondary | ICD-10-CM | POA: Diagnosis not present

## 2019-03-13 DIAGNOSIS — M47816 Spondylosis without myelopathy or radiculopathy, lumbar region: Secondary | ICD-10-CM | POA: Diagnosis not present

## 2019-03-13 DIAGNOSIS — M174 Other bilateral secondary osteoarthritis of knee: Secondary | ICD-10-CM | POA: Diagnosis not present

## 2019-03-13 DIAGNOSIS — G894 Chronic pain syndrome: Secondary | ICD-10-CM | POA: Diagnosis not present

## 2019-03-13 DIAGNOSIS — M40204 Unspecified kyphosis, thoracic region: Secondary | ICD-10-CM | POA: Diagnosis not present

## 2019-03-15 DIAGNOSIS — B349 Viral infection, unspecified: Secondary | ICD-10-CM | POA: Diagnosis not present

## 2019-04-01 DIAGNOSIS — F4322 Adjustment disorder with anxiety: Secondary | ICD-10-CM | POA: Diagnosis not present

## 2019-04-11 DIAGNOSIS — Z23 Encounter for immunization: Secondary | ICD-10-CM | POA: Diagnosis not present

## 2019-04-24 DIAGNOSIS — F319 Bipolar disorder, unspecified: Secondary | ICD-10-CM | POA: Diagnosis not present

## 2019-04-24 DIAGNOSIS — G894 Chronic pain syndrome: Secondary | ICD-10-CM | POA: Diagnosis not present

## 2019-04-24 DIAGNOSIS — I10 Essential (primary) hypertension: Secondary | ICD-10-CM | POA: Diagnosis not present

## 2019-04-29 DIAGNOSIS — F4322 Adjustment disorder with anxiety: Secondary | ICD-10-CM | POA: Diagnosis not present

## 2019-05-07 ENCOUNTER — Observation Stay (HOSPITAL_COMMUNITY)
Admission: EM | Admit: 2019-05-07 | Discharge: 2019-05-09 | Disposition: A | Discharge status: Home / Self Care | Payer: PPO | Attending: Surgery | Admitting: Surgery

## 2019-05-07 ENCOUNTER — Other Ambulatory Visit: Payer: Self-pay

## 2019-05-07 ENCOUNTER — Encounter (HOSPITAL_COMMUNITY): Payer: Self-pay

## 2019-05-07 DIAGNOSIS — Z8711 Personal history of peptic ulcer disease: Secondary | ICD-10-CM | POA: Insufficient documentation

## 2019-05-07 DIAGNOSIS — G4733 Obstructive sleep apnea (adult) (pediatric): Secondary | ICD-10-CM | POA: Diagnosis not present

## 2019-05-07 DIAGNOSIS — R1011 Right upper quadrant pain: Secondary | ICD-10-CM

## 2019-05-07 DIAGNOSIS — G8929 Other chronic pain: Secondary | ICD-10-CM | POA: Diagnosis not present

## 2019-05-07 DIAGNOSIS — K810 Acute cholecystitis: Secondary | ICD-10-CM | POA: Diagnosis not present

## 2019-05-07 DIAGNOSIS — K819 Cholecystitis, unspecified: Secondary | ICD-10-CM

## 2019-05-07 DIAGNOSIS — K8020 Calculus of gallbladder without cholecystitis without obstruction: Secondary | ICD-10-CM | POA: Diagnosis not present

## 2019-05-07 DIAGNOSIS — M549 Dorsalgia, unspecified: Secondary | ICD-10-CM | POA: Diagnosis not present

## 2019-05-07 DIAGNOSIS — K8000 Calculus of gallbladder with acute cholecystitis without obstruction: Secondary | ICD-10-CM | POA: Diagnosis not present

## 2019-05-07 DIAGNOSIS — F319 Bipolar disorder, unspecified: Secondary | ICD-10-CM | POA: Insufficient documentation

## 2019-05-07 DIAGNOSIS — I10 Essential (primary) hypertension: Secondary | ICD-10-CM | POA: Insufficient documentation

## 2019-05-07 DIAGNOSIS — R110 Nausea: Secondary | ICD-10-CM | POA: Diagnosis not present

## 2019-05-07 DIAGNOSIS — M1990 Unspecified osteoarthritis, unspecified site: Secondary | ICD-10-CM | POA: Diagnosis not present

## 2019-05-07 DIAGNOSIS — R001 Bradycardia, unspecified: Secondary | ICD-10-CM | POA: Diagnosis not present

## 2019-05-07 DIAGNOSIS — Z79891 Long term (current) use of opiate analgesic: Secondary | ICD-10-CM | POA: Diagnosis not present

## 2019-05-07 DIAGNOSIS — R1111 Vomiting without nausea: Secondary | ICD-10-CM | POA: Diagnosis not present

## 2019-05-07 DIAGNOSIS — Z531 Procedure and treatment not carried out because of patient's decision for reasons of belief and group pressure: Secondary | ICD-10-CM | POA: Insufficient documentation

## 2019-05-07 DIAGNOSIS — Z885 Allergy status to narcotic agent status: Secondary | ICD-10-CM | POA: Insufficient documentation

## 2019-05-07 DIAGNOSIS — R52 Pain, unspecified: Secondary | ICD-10-CM | POA: Diagnosis not present

## 2019-05-07 DIAGNOSIS — Z79899 Other long term (current) drug therapy: Secondary | ICD-10-CM | POA: Diagnosis not present

## 2019-05-07 DIAGNOSIS — K219 Gastro-esophageal reflux disease without esophagitis: Secondary | ICD-10-CM | POA: Diagnosis not present

## 2019-05-07 DIAGNOSIS — Z886 Allergy status to analgesic agent status: Secondary | ICD-10-CM | POA: Insufficient documentation

## 2019-05-07 DIAGNOSIS — Z20828 Contact with and (suspected) exposure to other viral communicable diseases: Secondary | ICD-10-CM | POA: Diagnosis not present

## 2019-05-07 DIAGNOSIS — Z03818 Encounter for observation for suspected exposure to other biological agents ruled out: Secondary | ICD-10-CM | POA: Diagnosis not present

## 2019-05-07 DIAGNOSIS — Z888 Allergy status to other drugs, medicaments and biological substances status: Secondary | ICD-10-CM | POA: Diagnosis not present

## 2019-05-07 LAB — CBC
HCT: 45.1 % (ref 36.0–46.0)
Hemoglobin: 15.6 g/dL — ABNORMAL HIGH (ref 12.0–15.0)
MCH: 31.6 pg (ref 26.0–34.0)
MCHC: 34.6 g/dL (ref 30.0–36.0)
MCV: 91.3 fL (ref 80.0–100.0)
Platelets: 395 10*3/uL (ref 150–400)
RBC: 4.94 MIL/uL (ref 3.87–5.11)
RDW: 12.4 % (ref 11.5–15.5)
WBC: 9.5 10*3/uL (ref 4.0–10.5)
nRBC: 0 % (ref 0.0–0.2)

## 2019-05-07 LAB — URINALYSIS, ROUTINE W REFLEX MICROSCOPIC
Bacteria, UA: NONE SEEN
Bilirubin Urine: NEGATIVE
Glucose, UA: NEGATIVE mg/dL
Hgb urine dipstick: NEGATIVE
Ketones, ur: 5 mg/dL — AB
Leukocytes,Ua: NEGATIVE
Nitrite: NEGATIVE
Protein, ur: >=300 mg/dL — AB
Specific Gravity, Urine: 1.013 (ref 1.005–1.030)
pH: 8 (ref 5.0–8.0)

## 2019-05-07 MED ORDER — SODIUM CHLORIDE 0.9% FLUSH: 3.0000 mL | Freq: Once | INTRAVENOUS | Status: DC

## 2019-05-07 NOTE — ED Triage Notes (Signed)
Pt from home with ems c.o n/v that started suddenly around 6pm tonight, pt also c.o epigastric pain that radiates to her back. Pt hypertensive en route. 190/113 in triage. Pt took 8mg  of her home zofran PTA. Pt a.o

## 2019-05-08 ENCOUNTER — Emergency Department (HOSPITAL_COMMUNITY): Payer: PPO | Admitting: Anesthesiology

## 2019-05-08 ENCOUNTER — Encounter (HOSPITAL_COMMUNITY): Payer: Self-pay | Admitting: *Deleted

## 2019-05-08 ENCOUNTER — Emergency Department (HOSPITAL_COMMUNITY): Payer: PPO

## 2019-05-08 ENCOUNTER — Encounter (HOSPITAL_COMMUNITY)
Admission: EM | Disposition: A | Discharge status: Home / Self Care | Payer: Self-pay | Source: Home / Self Care | Attending: Emergency Medicine

## 2019-05-08 DIAGNOSIS — K819 Cholecystitis, unspecified: Secondary | ICD-10-CM | POA: Diagnosis not present

## 2019-05-08 DIAGNOSIS — Z79891 Long term (current) use of opiate analgesic: Secondary | ICD-10-CM | POA: Diagnosis not present

## 2019-05-08 DIAGNOSIS — G4730 Sleep apnea, unspecified: Secondary | ICD-10-CM | POA: Diagnosis not present

## 2019-05-08 DIAGNOSIS — K219 Gastro-esophageal reflux disease without esophagitis: Secondary | ICD-10-CM | POA: Diagnosis not present

## 2019-05-08 DIAGNOSIS — G4733 Obstructive sleep apnea (adult) (pediatric): Secondary | ICD-10-CM | POA: Diagnosis not present

## 2019-05-08 DIAGNOSIS — M1990 Unspecified osteoarthritis, unspecified site: Secondary | ICD-10-CM | POA: Diagnosis not present

## 2019-05-08 DIAGNOSIS — K810 Acute cholecystitis: Secondary | ICD-10-CM | POA: Diagnosis present

## 2019-05-08 DIAGNOSIS — Z531 Procedure and treatment not carried out because of patient's decision for reasons of belief and group pressure: Secondary | ICD-10-CM | POA: Diagnosis not present

## 2019-05-08 DIAGNOSIS — I10 Essential (primary) hypertension: Secondary | ICD-10-CM | POA: Diagnosis not present

## 2019-05-08 DIAGNOSIS — G8929 Other chronic pain: Secondary | ICD-10-CM | POA: Diagnosis not present

## 2019-05-08 DIAGNOSIS — K8020 Calculus of gallbladder without cholecystitis without obstruction: Secondary | ICD-10-CM | POA: Diagnosis not present

## 2019-05-08 DIAGNOSIS — F319 Bipolar disorder, unspecified: Secondary | ICD-10-CM | POA: Diagnosis not present

## 2019-05-08 DIAGNOSIS — Z20828 Contact with and (suspected) exposure to other viral communicable diseases: Secondary | ICD-10-CM | POA: Diagnosis not present

## 2019-05-08 DIAGNOSIS — Z8711 Personal history of peptic ulcer disease: Secondary | ICD-10-CM | POA: Diagnosis not present

## 2019-05-08 DIAGNOSIS — K8000 Calculus of gallbladder with acute cholecystitis without obstruction: Secondary | ICD-10-CM | POA: Diagnosis not present

## 2019-05-08 DIAGNOSIS — R1011 Right upper quadrant pain: Secondary | ICD-10-CM | POA: Diagnosis not present

## 2019-05-08 DIAGNOSIS — M549 Dorsalgia, unspecified: Secondary | ICD-10-CM | POA: Diagnosis not present

## 2019-05-08 HISTORY — PX: CHOLECYSTECTOMY: SHX55

## 2019-05-08 LAB — COMPREHENSIVE METABOLIC PANEL
ALT: 100 U/L — ABNORMAL HIGH (ref 0–44)
AST: 52 U/L — ABNORMAL HIGH (ref 15–41)
Albumin: 5 g/dL (ref 3.5–5.0)
Alkaline Phosphatase: 155 U/L — ABNORMAL HIGH (ref 38–126)
Anion gap: 17 — ABNORMAL HIGH (ref 5–15)
BUN: 8 mg/dL (ref 6–20)
CO2: 25 mmol/L (ref 22–32)
Calcium: 10.5 mg/dL — ABNORMAL HIGH (ref 8.9–10.3)
Chloride: 95 mmol/L — ABNORMAL LOW (ref 98–111)
Creatinine, Ser: 0.84 mg/dL (ref 0.44–1.00)
GFR calc Af Amer: >60 mL/min (ref >60)
GFR calc non Af Amer: >60 mL/min (ref >60)
Glucose, Bld: 158 mg/dL — ABNORMAL HIGH (ref 70–99)
Potassium: 3.7 mmol/L (ref 3.5–5.1)
Sodium: 137 mmol/L (ref 135–145)
Total Bilirubin: 1 mg/dL (ref 0.3–1.2)
Total Protein: 8.4 g/dL — ABNORMAL HIGH (ref 6.5–8.1)

## 2019-05-08 LAB — I-STAT BETA HCG BLOOD, ED (MC, WL, AP ONLY): I-stat hCG, quantitative: <5 m[IU]/mL (ref <5)

## 2019-05-08 LAB — TROPONIN I (HIGH SENSITIVITY): Troponin I (High Sensitivity): 17 ng/L (ref <18)

## 2019-05-08 LAB — LIPASE, BLOOD: Lipase: 30 U/L (ref 11–51)

## 2019-05-08 LAB — SARS CORONAVIRUS 2 BY RT PCR (HOSPITAL ORDER, PERFORMED IN ~~LOC~~ HOSPITAL LAB): SARS Coronavirus 2: NEGATIVE

## 2019-05-08 SURGERY — LAPAROSCOPIC CHOLECYSTECTOMY WITH INTRAOPERATIVE CHOLANGIOGRAM
Anesthesia: General | Site: Abdomen

## 2019-05-08 MED ORDER — ONDANSETRON HCL 4 MG/2ML IJ SOLN
INTRAMUSCULAR | Status: DC | PRN
  Administered 2019-05-08: 4 mg via INTRAVENOUS

## 2019-05-08 MED ORDER — LABETALOL HCL 5 MG/ML IV SOLN
5.0000 mg | Freq: Once | INTRAVENOUS | Status: AC
  Administered 2019-05-08: 5 mg via INTRAVENOUS

## 2019-05-08 MED ORDER — PROTAMINE SULFATE 10 MG/ML IV SOLN
INTRAVENOUS | Status: AC
  Filled 2019-05-08: qty 25

## 2019-05-08 MED ORDER — ONDANSETRON HCL 4 MG/2ML IJ SOLN: 4.0000 mg | Freq: Four times a day (QID) | INTRAMUSCULAR | Status: DC | PRN

## 2019-05-08 MED ORDER — FENTANYL CITRATE (PF) 100 MCG/2ML IJ SOLN
25.0000 ug | INTRAMUSCULAR | Status: DC | PRN
  Administered 2019-05-08 (×3): 50 ug via INTRAVENOUS

## 2019-05-08 MED ORDER — SUCCINYLCHOLINE CHLORIDE 200 MG/10ML IV SOSY
PREFILLED_SYRINGE | INTRAVENOUS | Status: AC
  Filled 2019-05-08: qty 10

## 2019-05-08 MED ORDER — GABAPENTIN 300 MG PO CAPS
300.0000 mg | ORAL_CAPSULE | Freq: Two times a day (BID) | ORAL | Status: DC
  Administered 2019-05-09: 300 mg via ORAL
  Filled 2019-05-08: qty 1

## 2019-05-08 MED ORDER — OXYCODONE HCL 5 MG PO TABS
ORAL_TABLET | ORAL | Status: AC
  Filled 2019-05-08: qty 1

## 2019-05-08 MED ORDER — ACETAMINOPHEN 10 MG/ML IV SOLN
INTRAVENOUS | Status: AC
  Filled 2019-05-08: qty 100

## 2019-05-08 MED ORDER — KETAMINE HCL 50 MG/5ML IJ SOSY
PREFILLED_SYRINGE | INTRAMUSCULAR | Status: AC
  Filled 2019-05-08: qty 5

## 2019-05-08 MED ORDER — SODIUM CHLORIDE 0.9 % IV SOLN
2.0000 g | INTRAVENOUS | Status: DC
  Administered 2019-05-08 (×2): 2 g via INTRAVENOUS
  Filled 2019-05-08: qty 20

## 2019-05-08 MED ORDER — VORTIOXETINE HBR 5 MG PO TABS
5.0000 mg | ORAL_TABLET | Freq: Every day | ORAL | Status: DC
  Administered 2019-05-08 – 2019-05-09 (×2): 5 mg via ORAL
  Filled 2019-05-08 (×2): qty 1

## 2019-05-08 MED ORDER — FENTANYL CITRATE (PF) 100 MCG/2ML IJ SOLN: 25.0000 ug | INTRAMUSCULAR | Status: DC | PRN

## 2019-05-08 MED ORDER — KETAMINE HCL 10 MG/ML IJ SOLN
INTRAMUSCULAR | Status: DC | PRN
  Administered 2019-05-08: 50 mg via INTRAVENOUS

## 2019-05-08 MED ORDER — SODIUM CHLORIDE 0.9 % IR SOLN
Status: DC | PRN
  Administered 2019-05-08: 1000 mL

## 2019-05-08 MED ORDER — HEPARIN SODIUM (PORCINE) 1000 UNIT/ML IJ SOLN
INTRAMUSCULAR | Status: AC
  Filled 2019-05-08: qty 1

## 2019-05-08 MED ORDER — ENOXAPARIN SODIUM 40 MG/0.4ML ~~LOC~~ SOLN
40.0000 mg | SUBCUTANEOUS | Status: DC
  Administered 2019-05-09: 40 mg via SUBCUTANEOUS
  Filled 2019-05-08: qty 0.4

## 2019-05-08 MED ORDER — FENTANYL CITRATE (PF) 100 MCG/2ML IJ SOLN
INTRAMUSCULAR | Status: AC
  Filled 2019-05-08: qty 2

## 2019-05-08 MED ORDER — OXYCODONE HCL 5 MG PO TABS
5.0000 mg | ORAL_TABLET | ORAL | Status: DC | PRN
  Administered 2019-05-08 – 2019-05-09 (×3): 5 mg via ORAL
  Filled 2019-05-08 (×3): qty 1

## 2019-05-08 MED ORDER — SODIUM CHLORIDE 0.9 % IV SOLN
INTRAVENOUS | Status: DC | PRN
  Administered 2019-05-08: 13:00:00 via INTRAVENOUS

## 2019-05-08 MED ORDER — OXYCODONE HCL 5 MG/5ML PO SOLN: 5.0000 mg | Freq: Once | ORAL | Status: AC | PRN

## 2019-05-08 MED ORDER — ONDANSETRON HCL 4 MG/2ML IJ SOLN
INTRAMUSCULAR | Status: AC
  Filled 2019-05-08: qty 2

## 2019-05-08 MED ORDER — ACETAMINOPHEN 10 MG/ML IV SOLN
1000.0000 mg | Freq: Once | INTRAVENOUS | Status: DC | PRN
  Administered 2019-05-08: 1000 mg via INTRAVENOUS

## 2019-05-08 MED ORDER — LIDOCAINE 2% (20 MG/ML) 5 ML SYRINGE
INTRAMUSCULAR | Status: AC
  Filled 2019-05-08: qty 5

## 2019-05-08 MED ORDER — TRAZODONE HCL 100 MG PO TABS
100.0000 mg | ORAL_TABLET | Freq: Every day | ORAL | Status: DC
  Administered 2019-05-08: 100 mg via ORAL
  Filled 2019-05-08: qty 1

## 2019-05-08 MED ORDER — PHENYLEPHRINE 40 MCG/ML (10ML) SYRINGE FOR IV PUSH (FOR BLOOD PRESSURE SUPPORT)
PREFILLED_SYRINGE | INTRAVENOUS | Status: AC
  Filled 2019-05-08: qty 10

## 2019-05-08 MED ORDER — HYOSCYAMINE SULFATE 0.125 MG SL SUBL
0.1250 mg | SUBLINGUAL_TABLET | SUBLINGUAL | Status: DC | PRN
  Filled 2019-05-08: qty 1

## 2019-05-08 MED ORDER — MORPHINE SULFATE (PF) 4 MG/ML IV SOLN
4.0000 mg | Freq: Once | INTRAVENOUS | Status: AC
  Administered 2019-05-08: 4 mg via INTRAVENOUS
  Filled 2019-05-08: qty 1

## 2019-05-08 MED ORDER — ONDANSETRON 4 MG PO TBDP: 8.0000 mg | ORAL_TABLET | Freq: Four times a day (QID) | ORAL | Status: DC | PRN

## 2019-05-08 MED ORDER — DEXAMETHASONE SODIUM PHOSPHATE 10 MG/ML IJ SOLN
INTRAMUSCULAR | Status: DC | PRN
  Administered 2019-05-08: 10 mg via INTRAVENOUS

## 2019-05-08 MED ORDER — GUANFACINE HCL ER 1 MG PO TB24
4.0000 mg | ORAL_TABLET | Freq: Every day | ORAL | Status: DC
  Administered 2019-05-08 – 2019-05-09 (×2): 4 mg via ORAL
  Filled 2019-05-08 (×2): qty 4

## 2019-05-08 MED ORDER — PROPOFOL 10 MG/ML IV BOLUS
INTRAVENOUS | Status: DC | PRN
  Administered 2019-05-08: 200 mg via INTRAVENOUS

## 2019-05-08 MED ORDER — ONDANSETRON 4 MG PO TBDP: 8.0000 mg | ORAL_TABLET | Freq: Three times a day (TID) | ORAL | Status: DC | PRN

## 2019-05-08 MED ORDER — MIDAZOLAM HCL 5 MG/5ML IJ SOLN
INTRAMUSCULAR | Status: DC | PRN
  Administered 2019-05-08: 2 mg via INTRAVENOUS

## 2019-05-08 MED ORDER — 0.9 % SODIUM CHLORIDE (POUR BTL) OPTIME
TOPICAL | Status: DC | PRN
  Administered 2019-05-08: 1000 mL

## 2019-05-08 MED ORDER — SUGAMMADEX SODIUM 200 MG/2ML IV SOLN
INTRAVENOUS | Status: DC | PRN
  Administered 2019-05-08: 200 mg via INTRAVENOUS

## 2019-05-08 MED ORDER — SUMATRIPTAN SUCCINATE 50 MG PO TABS
50.0000 mg | ORAL_TABLET | ORAL | Status: DC | PRN
  Filled 2019-05-08: qty 1

## 2019-05-08 MED ORDER — TIZANIDINE HCL 2 MG PO TABS
4.0000 mg | ORAL_TABLET | Freq: Every day | ORAL | Status: DC
  Administered 2019-05-08 – 2019-05-09 (×2): 4 mg via ORAL
  Filled 2019-05-08 (×3): qty 2

## 2019-05-08 MED ORDER — DOCUSATE SODIUM 100 MG PO CAPS
400.0000 mg | ORAL_CAPSULE | Freq: Two times a day (BID) | ORAL | Status: DC
  Administered 2019-05-09: 400 mg via ORAL
  Filled 2019-05-08: qty 4

## 2019-05-08 MED ORDER — FENTANYL CITRATE (PF) 100 MCG/2ML IJ SOLN
100.0000 ug | Freq: Once | INTRAMUSCULAR | Status: DC
  Filled 2019-05-08: qty 2

## 2019-05-08 MED ORDER — PANTOPRAZOLE SODIUM 40 MG PO TBEC
40.0000 mg | DELAYED_RELEASE_TABLET | Freq: Two times a day (BID) | ORAL | Status: DC
  Administered 2019-05-08 – 2019-05-09 (×2): 40 mg via ORAL
  Filled 2019-05-08 (×2): qty 1

## 2019-05-08 MED ORDER — MAGNESIUM OXIDE 400 (241.3 MG) MG PO TABS
400.0000 mg | ORAL_TABLET | Freq: Two times a day (BID) | ORAL | Status: DC | PRN
  Filled 2019-05-08: qty 1
  Filled 2019-05-08: qty 2

## 2019-05-08 MED ORDER — FENTANYL CITRATE (PF) 100 MCG/2ML IJ SOLN
50.0000 ug | Freq: Once | INTRAMUSCULAR | Status: AC
  Administered 2019-05-08: 50 ug via INTRAVENOUS

## 2019-05-08 MED ORDER — KCL IN DEXTROSE-NACL 20-5-0.45 MEQ/L-%-% IV SOLN
INTRAVENOUS | Status: DC
  Administered 2019-05-08 – 2019-05-09 (×2): via INTRAVENOUS
  Filled 2019-05-08 (×2): qty 1000

## 2019-05-08 MED ORDER — CLONAZEPAM 1 MG PO TABS: 2.0000 mg | ORAL_TABLET | Freq: Every evening | ORAL | Status: DC | PRN

## 2019-05-08 MED ORDER — LACTATED RINGERS IV SOLN
INTRAVENOUS | Status: DC | PRN
  Administered 2019-05-08: 14:00:00 via INTRAVENOUS

## 2019-05-08 MED ORDER — WHITE PETROLATUM EX OINT
TOPICAL_OINTMENT | CUTANEOUS | Status: AC
  Filled 2019-05-08: qty 28.35

## 2019-05-08 MED ORDER — LAMOTRIGINE 100 MG PO TABS
400.0000 mg | ORAL_TABLET | Freq: Every day | ORAL | Status: DC
  Administered 2019-05-08: 400 mg via ORAL
  Filled 2019-05-08: qty 4

## 2019-05-08 MED ORDER — ACETAMINOPHEN 500 MG PO TABS: 1000.0000 mg | ORAL_TABLET | Freq: Once | ORAL | Status: DC | PRN

## 2019-05-08 MED ORDER — LIDOCAINE 2% (20 MG/ML) 5 ML SYRINGE
INTRAMUSCULAR | Status: DC | PRN
  Administered 2019-05-08: 60 mg via INTRAVENOUS

## 2019-05-08 MED ORDER — CELECOXIB 200 MG PO CAPS
200.0000 mg | ORAL_CAPSULE | Freq: Every day | ORAL | Status: DC
  Administered 2019-05-08 – 2019-05-09 (×2): 200 mg via ORAL
  Filled 2019-05-08 (×2): qty 1

## 2019-05-08 MED ORDER — KETOROLAC TROMETHAMINE 30 MG/ML IJ SOLN
30.0000 mg | Freq: Once | INTRAMUSCULAR | Status: AC
  Administered 2019-05-08: 30 mg via INTRAVENOUS

## 2019-05-08 MED ORDER — BUPIVACAINE-EPINEPHRINE 0.5% -1:200000 IJ SOLN
INTRAMUSCULAR | Status: DC | PRN
  Administered 2019-05-08: 20 mL

## 2019-05-08 MED ORDER — ROCURONIUM BROMIDE 50 MG/5ML IV SOSY
PREFILLED_SYRINGE | INTRAVENOUS | Status: DC | PRN
  Administered 2019-05-08: 50 mg via INTRAVENOUS

## 2019-05-08 MED ORDER — TRAMADOL HCL 50 MG PO TABS: 50.0000 mg | ORAL_TABLET | Freq: Four times a day (QID) | ORAL | Status: DC | PRN

## 2019-05-08 MED ORDER — MAGNESIUM 250 MG PO TABS: 250.0000 mg | ORAL_TABLET | Freq: Two times a day (BID) | ORAL | Status: DC | PRN

## 2019-05-08 MED ORDER — PROCHLORPERAZINE EDISYLATE 10 MG/2ML IJ SOLN: 10.0000 mg | Freq: Four times a day (QID) | INTRAMUSCULAR | Status: DC | PRN

## 2019-05-08 MED ORDER — BUPIVACAINE-EPINEPHRINE 0.5% -1:200000 IJ SOLN
INTRAMUSCULAR | Status: AC
  Filled 2019-05-08: qty 1

## 2019-05-08 MED ORDER — PROMETHAZINE HCL 25 MG/ML IJ SOLN
INTRAMUSCULAR | Status: AC
  Filled 2019-05-08: qty 1

## 2019-05-08 MED ORDER — EPHEDRINE 5 MG/ML INJ
INTRAVENOUS | Status: AC
  Filled 2019-05-08: qty 10

## 2019-05-08 MED ORDER — VILAZODONE HCL 20 MG PO TABS
20.0000 mg | ORAL_TABLET | Freq: Every day | ORAL | Status: DC
  Filled 2019-05-08: qty 1

## 2019-05-08 MED ORDER — FENTANYL CITRATE (PF) 250 MCG/5ML IJ SOLN
INTRAMUSCULAR | Status: AC
  Filled 2019-05-08: qty 5

## 2019-05-08 MED ORDER — DIPHENHYDRAMINE HCL 50 MG/ML IJ SOLN: 25.0000 mg | Freq: Four times a day (QID) | INTRAMUSCULAR | Status: DC | PRN

## 2019-05-08 MED ORDER — KETOROLAC TROMETHAMINE 30 MG/ML IJ SOLN
INTRAMUSCULAR | Status: AC
  Filled 2019-05-08: qty 1

## 2019-05-08 MED ORDER — FENTANYL CITRATE (PF) 100 MCG/2ML IJ SOLN
25.0000 ug | INTRAMUSCULAR | Status: DC | PRN
  Administered 2019-05-08: 25 ug via INTRAVENOUS
  Filled 2019-05-08: qty 2

## 2019-05-08 MED ORDER — DEXAMETHASONE SODIUM PHOSPHATE 10 MG/ML IJ SOLN
INTRAMUSCULAR | Status: AC
  Filled 2019-05-08: qty 2

## 2019-05-08 MED ORDER — SENNA 8.6 MG PO TABS
1.0000 | ORAL_TABLET | Freq: Two times a day (BID) | ORAL | Status: DC
  Administered 2019-05-08 – 2019-05-09 (×2): 8.6 mg via ORAL
  Filled 2019-05-08 (×2): qty 1

## 2019-05-08 MED ORDER — HEMOSTATIC AGENTS (NO CHARGE) OPTIME
TOPICAL | Status: DC | PRN
  Administered 2019-05-08: 1 via TOPICAL

## 2019-05-08 MED ORDER — PROPOFOL 10 MG/ML IV BOLUS
INTRAVENOUS | Status: AC
  Filled 2019-05-08: qty 20

## 2019-05-08 MED ORDER — LACTATED RINGERS IV SOLN: INTRAVENOUS | Status: DC | PRN

## 2019-05-08 MED ORDER — ACETAMINOPHEN 500 MG PO TABS
1000.0000 mg | ORAL_TABLET | Freq: Four times a day (QID) | ORAL | Status: DC
  Administered 2019-05-09: 1000 mg via ORAL
  Filled 2019-05-08 (×2): qty 2

## 2019-05-08 MED ORDER — TIZANIDINE HCL 2 MG PO TABS
12.0000 mg | ORAL_TABLET | Freq: Every day | ORAL | Status: DC
  Administered 2019-05-08: 12 mg via ORAL

## 2019-05-08 MED ORDER — LABETALOL HCL 5 MG/ML IV SOLN
INTRAVENOUS | Status: AC
  Filled 2019-05-08: qty 4

## 2019-05-08 MED ORDER — FENTANYL 25 MCG/HR TD PT72
1.0000 | MEDICATED_PATCH | TRANSDERMAL | Status: DC
  Administered 2019-05-09: 1 via TRANSDERMAL
  Filled 2019-05-08: qty 1

## 2019-05-08 MED ORDER — SUCCINYLCHOLINE CHLORIDE 20 MG/ML IJ SOLN
INTRAMUSCULAR | Status: DC | PRN
  Administered 2019-05-08: 100 mg via INTRAVENOUS

## 2019-05-08 MED ORDER — METOPROLOL TARTRATE 5 MG/5ML IV SOLN: 5.0000 mg | Freq: Four times a day (QID) | INTRAVENOUS | Status: DC | PRN

## 2019-05-08 MED ORDER — PROMETHAZINE HCL 25 MG/ML IJ SOLN
6.2500 mg | Freq: Once | INTRAMUSCULAR | Status: AC
  Administered 2019-05-08: 6.25 mg via INTRAVENOUS

## 2019-05-08 MED ORDER — DIPHENHYDRAMINE HCL 25 MG PO CAPS: 25.0000 mg | ORAL_CAPSULE | Freq: Four times a day (QID) | ORAL | Status: DC | PRN

## 2019-05-08 MED ORDER — CLONAZEPAM 1 MG PO TABS: 1.0000 mg | ORAL_TABLET | Freq: Every day | ORAL | Status: DC | PRN

## 2019-05-08 MED ORDER — FENTANYL CITRATE (PF) 100 MCG/2ML IJ SOLN
INTRAMUSCULAR | Status: DC | PRN
  Administered 2019-05-08: 150 ug via INTRAVENOUS
  Administered 2019-05-08: 100 ug via INTRAVENOUS
  Administered 2019-05-08 (×2): 150 ug via INTRAVENOUS
  Administered 2019-05-08 (×2): 100 ug via INTRAVENOUS

## 2019-05-08 MED ORDER — ONDANSETRON HCL 4 MG/2ML IJ SOLN
4.0000 mg | Freq: Once | INTRAMUSCULAR | Status: AC
  Administered 2019-05-08: 4 mg via INTRAVENOUS
  Filled 2019-05-08: qty 2

## 2019-05-08 MED ORDER — TRIAMTERENE-HCTZ 37.5-25 MG PO TABS
0.5000 | ORAL_TABLET | Freq: Every day | ORAL | Status: DC
  Administered 2019-05-08 – 2019-05-09 (×2): 0.5 via ORAL
  Filled 2019-05-08 (×2): qty 0.5

## 2019-05-08 MED ORDER — MIDAZOLAM HCL 2 MG/2ML IJ SOLN
INTRAMUSCULAR | Status: AC
  Filled 2019-05-08: qty 2

## 2019-05-08 MED ORDER — ROCURONIUM BROMIDE 10 MG/ML (PF) SYRINGE
PREFILLED_SYRINGE | INTRAVENOUS | Status: AC
  Filled 2019-05-08: qty 10

## 2019-05-08 MED ORDER — OXYCODONE HCL 5 MG PO TABS
10.0000 mg | ORAL_TABLET | Freq: Once | ORAL | Status: AC | PRN
  Administered 2019-05-08: 10 mg via ORAL

## 2019-05-08 MED ORDER — ACETAMINOPHEN 160 MG/5ML PO SOLN: 1000.0000 mg | Freq: Once | ORAL | Status: DC | PRN

## 2019-05-08 SURGICAL SUPPLY — 40 items
ADH SKN CLS APL DERMABOND .7 (GAUZE/BANDAGES/DRESSINGS) ×1
APL PRP STRL LF DISP 70% ISPRP (MISCELLANEOUS) ×1
APPLIER CLIP 5 13 M/L LIGAMAX5 (MISCELLANEOUS) ×3
APR CLP MED LRG 5 ANG JAW (MISCELLANEOUS) ×1
BAG SPEC RTRVL LRG 6X4 10 (ENDOMECHANICALS) ×1
BLADE CLIPPER SURG (BLADE) IMPLANT
CANISTER SUCT 3000ML PPV (MISCELLANEOUS) ×3 IMPLANT
CHLORAPREP W/TINT 26 (MISCELLANEOUS) ×3 IMPLANT
CLIP APPLIE 5 13 M/L LIGAMAX5 (MISCELLANEOUS) ×1 IMPLANT
COVER MAYO STAND STRL (DRAPES) ×2 IMPLANT
COVER SURGICAL LIGHT HANDLE (MISCELLANEOUS) ×3 IMPLANT
COVER WAND RF STERILE (DRAPES) ×1 IMPLANT
DERMABOND ADVANCED (GAUZE/BANDAGES/DRESSINGS) ×2
DERMABOND ADVANCED .7 DNX12 (GAUZE/BANDAGES/DRESSINGS) ×1 IMPLANT
DRAPE C-ARM 42X120 X-RAY (DRAPES) IMPLANT
ELECT REM PT RETURN 9FT ADLT (ELECTROSURGICAL) ×3
ELECTRODE REM PT RTRN 9FT ADLT (ELECTROSURGICAL) ×1 IMPLANT
GLOVE SURG SIGNA 7.5 PF LTX (GLOVE) ×3 IMPLANT
GOWN STRL REUS W/ TWL LRG LVL3 (GOWN DISPOSABLE) ×2 IMPLANT
GOWN STRL REUS W/ TWL XL LVL3 (GOWN DISPOSABLE) ×1 IMPLANT
GOWN STRL REUS W/TWL LRG LVL3 (GOWN DISPOSABLE) ×6
GOWN STRL REUS W/TWL XL LVL3 (GOWN DISPOSABLE) ×3
KIT BASIN OR (CUSTOM PROCEDURE TRAY) ×3 IMPLANT
KIT TURNOVER KIT B (KITS) ×3 IMPLANT
NS IRRIG 1000ML POUR BTL (IV SOLUTION) ×3 IMPLANT
PAD ARMBOARD 7.5X6 YLW CONV (MISCELLANEOUS) ×3 IMPLANT
POUCH SPECIMEN RETRIEVAL 10MM (ENDOMECHANICALS) ×3 IMPLANT
SCISSORS LAP 5X35 DISP (ENDOMECHANICALS) ×3 IMPLANT
SET CHOLANGIOGRAPH 5 50 .035 (SET/KITS/TRAYS/PACK) ×2 IMPLANT
SET IRRIG TUBING LAPAROSCOPIC (IRRIGATION / IRRIGATOR) ×3 IMPLANT
SET TUBE SMOKE EVAC HIGH FLOW (TUBING) ×3 IMPLANT
SLEEVE ENDOPATH XCEL 5M (ENDOMECHANICALS) ×6 IMPLANT
SPECIMEN JAR SMALL (MISCELLANEOUS) ×3 IMPLANT
SUT MNCRL AB 4-0 PS2 18 (SUTURE) ×3 IMPLANT
TOWEL GREEN STERILE (TOWEL DISPOSABLE) ×3 IMPLANT
TOWEL GREEN STERILE FF (TOWEL DISPOSABLE) ×3 IMPLANT
TRAY LAPAROSCOPIC MC (CUSTOM PROCEDURE TRAY) ×3 IMPLANT
TROCAR XCEL BLUNT TIP 100MML (ENDOMECHANICALS) ×3 IMPLANT
TROCAR XCEL NON-BLD 5MMX100MML (ENDOMECHANICALS) ×3 IMPLANT
WATER STERILE IRR 1000ML POUR (IV SOLUTION) ×3 IMPLANT

## 2019-05-08 NOTE — Discharge Instructions (Signed)
[facility] LAPAROSCOPIC SURGERY: POST OP INSTRUCTIONS Always review your discharge instruction sheet given to you by the facility where your surgery was performed. IF YOU HAVE DISABILITY OR FAMILY LEAVE FORMS, YOU MUST BRING THEM TO THE OFFICE FOR PROCESSING.   DO NOT GIVE THEM TO YOUR DOCTOR.  PAIN CONTROL  1. First take acetaminophen (Tylenol) AND/or ibuprofen (Advil) to control your pain after surgery.  Follow directions on package.  Taking acetaminophen (Tylenol) and/or ibuprofen (Advil) regularly after surgery will help to control your pain and lower the amount of prescription pain medication you may need.  You should not take more than 3,000 mg (3 grams) of acetaminophen (Tylenol) in 24 hours.  You should not take ibuprofen (Advil), aleve, motrin, naprosyn or other NSAIDS if you have a history of stomach ulcers or chronic kidney disease.  2. A prescription for pain medication may be given to you upon discharge.  Take your pain medication as prescribed, if you still have uncontrolled pain after taking acetaminophen (Tylenol) or ibuprofen (Advil). 3. Use ice packs to help control pain. 4. If you need a refill on your pain medication, please contact your pharmacy.  They will contact our office to request authorization. Prescriptions will not be filled after 5pm or on week-ends.  HOME MEDICATIONS 5. Take your usually prescribed medications unless otherwise directed.  DIET 6. You should follow a light diet the first few days after arrival home.  Be sure to include lots of fluids daily. Avoid fatty, fried foods.   CONSTIPATION 7. It is common to experience some constipation after surgery and if you are taking pain medication.  Increasing fluid intake and taking a stool softener (such as Colace) will usually help or prevent this problem from occurring.  A mild laxative (Milk of Magnesia or Miralax) should be taken according to package instructions if there are no bowel  movements after 48 hours.  WOUND/INCISION CARE 8. Most patients will experience some swelling and bruising in the area of the incisions.  Ice packs will help.  Swelling and bruising can take several days to resolve.  9. Unless discharge instructions indicate otherwise, follow guidelines below  a. STERI-STRIPS - you may remove your outer bandages 48 hours after surgery, and you may shower at that time.  You have steri-strips (small skin tapes) in place directly over the incision.  These strips should be left on the skin for 7-10 days.   b. DERMABOND/SKIN GLUE - you may shower in 24 hours.  The glue will flake off over the next 2-3 weeks. 10. Any sutures or staples will be removed at the office during your follow-up visit.  ACTIVITIES 11. You may resume regular (light) daily activities beginning the next day--such as daily self-care, walking, climbing stairs--gradually increasing activities as tolerated.  You may have sexual intercourse when it is comfortable.  Refrain from any heavy lifting or straining until approved by your doctor. a. You may drive when you are no longer taking prescription pain medication, you can comfortably wear a seatbelt, and you can safely maneuver your car and apply brakes.  FOLLOW-UP 12. You should see your doctor in the office for a follow-up appointment approximately 2-3 weeks after your surgery.  You should have been given your post-op/follow-up appointment when your surgery was scheduled.  If you did not receive a post-op/follow-up appointment, make sure that you call for this appointment within a day or two after you arrive home to insure a convenient appointment time.     WHEN TO CALL YOUR DOCTOR: 1. Fever over 101.0 2. Inability to urinate 3. Continued bleeding from incision. 4. Increased pain, redness, or drainage from the incision. 5. Increasing abdominal pain  The clinic staff is available to answer your questions during regular business hours.  Please don't  hesitate to call and ask to speak to one of the nurses for clinical concerns.  If you have a medical emergency, go to the nearest emergency room or call 911.  A surgeon from [facility] is always on call at the hospital. [address], [address], [place], [place]  [address] ? [address] [address], [place], [place]   [address] [phone] ? [phone] ? FAX [phone] Web site: [email or web address]  .........   Managing Your Pain After Surgery Without Opioids    Thank you for participating in our program to help patients manage their pain after surgery without opioids. This is part of our effort to provide you with the best care possible, without exposing you or your family to the risk that opioids pose.  What pain can I expect after surgery? You can expect to have some pain after surgery. This is normal. The pain is typically worse the day after surgery, and quickly begins to get better. Many studies have found that many patients are able to manage their pain after surgery with Over-the-Counter (OTC) medications such as Tylenol and Motrin. If you have a condition that does not allow you to take Tylenol or Motrin, notify your surgical team.  How will I manage my pain? The best strategy for controlling your pain after surgery is around the clock pain control with Tylenol (acetaminophen) and Motrin (ibuprofen or Advil). Alternating these medications with each other allows you to maximize your pain control. In addition to Tylenol and Motrin, you can use heating pads or ice packs on your incisions to help reduce your pain.  How will I alternate your regular strength over-the-counter pain medication? You will take a dose of pain medication every three hours. ; Start by taking 650 mg of Tylenol (2 pills of 325 mg) ; 3 hours later take 600 mg of Motrin (3 pills of 200 mg) ; 3 hours after taking the Motrin take 650 mg of Tylenol ; 3 hours after that take 600 mg of  Motrin.   - 1 -  See example - if your first dose of Tylenol is at 12:00 PM   12:00 PM Tylenol 650 mg (2 pills of 325 mg)  3:00 PM Motrin 600 mg (3 pills of 200 mg)  6:00 PM Tylenol 650 mg (2 pills of 325 mg)  9:00 PM Motrin 600 mg (3 pills of 200 mg)  Continue alternating every 3 hours   We recommend that you follow this schedule around-the-clock for at least 3 days after surgery, or until you feel that it is no longer needed. Use the table on the last page of this handout to keep track of the medications you are taking. Important: Do not take more than 3000mg of Tylenol or 3200mg of Motrin in a 24-hour period. Do not take ibuprofen/Motrin if you have a history of bleeding stomach ulcers, severe kidney disease, &/or actively taking a blood thinner  What if I still have pain? If you have pain that is not controlled with the over-the-counter pain medications (Tylenol and Motrin or Advil) you might have what we call "breakthrough" pain. You will receive a prescription for a small amount of an opioid pain medication such as   Oxycodone, Tramadol, or Tylenol with Codeine. Use these opioid pills in the first 24 hours after surgery if you have breakthrough pain. Do not take more than 1 pill every 4-6 hours.  If you still have uncontrolled pain after using all opioid pills, don't hesitate to call our staff using the number provided. We will help make sure you are managing your pain in the best way possible, and if necessary, we can provide a prescription for additional pain medication.   Day 1    Time  Name of Medication Number of pills taken  Amount of Acetaminophen  Pain Level   Comments  AM PM       AM PM       AM PM       AM PM       AM PM       AM PM       AM PM       AM PM       Total Daily amount of Acetaminophen Do not take more than  3,000 mg per day      Day 2    Time  Name of Medication Number of pills taken  Amount of Acetaminophen  Pain Level   Comments  AM  PM       AM PM       AM PM       AM PM       AM PM       AM PM       AM PM       AM PM       Total Daily amount of Acetaminophen Do not take more than  3,000 mg per day      Day 3    Time  Name of Medication Number of pills taken  Amount of Acetaminophen  Pain Level   Comments  AM PM       AM PM       AM PM       AM PM          AM PM       AM PM       AM PM       AM PM       Total Daily amount of Acetaminophen Do not take more than  3,000 mg per day      Day 4    Time  Name of Medication Number of pills taken  Amount of Acetaminophen  Pain Level   Comments  AM PM       AM PM       AM PM       AM PM       AM PM       AM PM       AM PM       AM PM       Total Daily amount of Acetaminophen Do not take more than  3,000 mg per day      Day 5    Time  Name of Medication Number of pills taken  Amount of Acetaminophen  Pain Level   Comments  AM PM       AM PM       AM PM       AM PM       AM PM       AM PM       AM PM         AM PM       Total Daily amount of Acetaminophen Do not take more than  3,000 mg per day       Day 6    Time  Name of Medication Number of pills taken  Amount of Acetaminophen  Pain Level  Comments  AM PM       AM PM       AM PM       AM PM       AM PM       AM PM       AM PM       AM PM       Total Daily amount of Acetaminophen Do not take more than  3,000 mg per day      Day 7    Time  Name of Medication Number of pills taken  Amount of Acetaminophen  Pain Level   Comments  AM PM       AM PM       AM PM       AM PM       AM PM       AM PM       AM PM       AM PM       Total Daily amount of Acetaminophen Do not take more than  3,000 mg per day        For additional information about how and where to safely dispose of unused opioid medications - https://www.morepowerfulnc.org  Disclaimer: This document contains information and/or instructional materials adapted from [facility]  for the typical patient with your condition. It does not replace medical advice from your health care provider because your experience may differ from that of the typical patient. Talk to your health care provider if you have any questions about this document, your condition or your treatment plan. Adapted from [facility]  

## 2019-05-08 NOTE — ED Notes (Signed)
Dr. Blackman at bedside. 

## 2019-05-08 NOTE — Op Note (Signed)
   Shelley Vasquez 05/07/2019 - 05/08/2019   Pre-op Diagnosis: Acute Cholecystitis with cholelthiasis     Post-op Diagnosis: same  Procedure(s): LAPAROSCOPIC CHOLECYSTECTOMY  Surgeon(s): Coralie Keens, MD   Assistant: Brigid Re, PA  Anesthesia: General  Staff:  Circulator: Cecilio Asper, RN Scrub Person: Rozell Searing, RN; Lewis Moccasin Delight Stare, RN; Rodney Booze, EMT; Teschner, Mindy K, CST  Estimated Blood Loss: Minimal               Specimens: sent to path  Indications: This is a 53 year old female who presents with acute cholecystitis and gallstones.  The decision was made to proceed to the operating room for cholecystectomy  Findings: The patient was found to have an acutely inflamed distended gallbladder with signs of necrosis and gallstones  Procedure: The patient was brought to the operating room identifies correct patient.  She is placed upon the operating table and general anesthesia was induced.  Her abdomen was then prepped and draped in usual sterile fashion.  I made a small vertical incision below the umbilicus with a scalpel.  I took this down to the fascia which was then open with a scalpel.  Hemostat was then used to pass into the peritoneal cavity under direct vision.  A 0 Vicryl pursing suture was placed around the fascial opening.  The [facility] port was placed through the opening insufflation of the abdomen was begun.  A 5 mm trocar was placed in the patient's epigastrium and 2 more were placed in the right upper quadrant all under direct vision.  The gallbladder was found to be distended and acutely inflamed with areas of necrosis.  I needle aspirated bile from the gallbladder in order to facilitate grasping the gallbladder.  It was then retracted above the liver bed.  Dissection was then carried out the base the gallbladder.  The cystic duct and cystic artery were dissected out and a critical end was achieved around both.  The cystic artery actually  wrapped around the cystic duct going posterior to anterior.  I clipped it proximally and distally and transected it.  He continued to bleed so I had to forego cholangiogram and go ahead and clipped and transect the cystic duct.  This allowed me to see the cystic artery better and put another clip on it to achieve hemostasis.  I then slowly dissected the gallbladder free from the liver bed with electrocautery.  Once it was free from liver bed I achieved hemostasis with the electrocautery.  I placed a piece of surgical snow in the gallbladder fossa as well.  Again, hemostasis appeared to be achieved.  The gallbladder was then placed in an Endosac and removed through the incision at the umbilicus.  0 Vicryl at the umbilicus was tied in place closing the fascial defect.  I again irrigated the right upper quadrant with normal saline.  Hemostasis appeared to be achieved.  All ports were then removed under direct vision and the abdomen was deflated.  All incisions were then anesthetized with Marcaine and closed with 4-0 Monocryl sutures.  Dermabond was then applied.  The patient tolerated the procedure well.  All the counts were correct at the end of the procedure.  The patient was then extubated in the operating room and taken in a stable condition to the recovery room.          Coralie Keens   Date: 05/08/2019  Time: 1:41 PM

## 2019-05-08 NOTE — Anesthesia Preprocedure Evaluation (Addendum)
Anesthesia Evaluation  Patient identified by MRN, date of birth, ID band Patient awake    Reviewed: Allergy & Precautions, NPO status , Patient's Chart, lab work & pertinent test results  History of Anesthesia Complications Negative for: history of anesthetic complications  Airway Mallampati: II  TM Distance: >3 FB Neck ROM: Full    Dental  (+) Dental Advisory Given   Pulmonary neg sleep apnea (does not use CPAP),    breath sounds clear to auscultation       Cardiovascular hypertension, Pt. on medications (-) angina(-) Past MI and (-) CHF  Rhythm:Regular Rate:Normal     Neuro/Psych PSYCHIATRIC DISORDERS Anxiety Depression Bipolar Disorder Chronic back pain: narcotics  Neuromuscular disease    GI/Hepatic GERD  Medicated,(+)     substance abuse  ,   Endo/Other  negative endocrine ROS  Renal/GU negative Renal ROS     Musculoskeletal  (+) Arthritis , narcotic dependent  Abdominal (+) + obese,   Peds  Hematology  (+) REFUSES BLOOD PRODUCTS, JEHOVAH'S WITNESS (does not accept any blood products, will accept cell-saver, unsure about albumin)No albumin, blood, plts, ffp  Will do cell saver   Anesthesia Other Findings   Reproductive/Obstetrics S/p hysterectomy                            Anesthesia Physical  Anesthesia Plan  ASA: III  Anesthesia Plan: General   Post-op Pain Management:    Induction: Intravenous, Rapid sequence and Cricoid pressure planned  PONV Risk Score and Plan: 3 and Treatment may vary due to age or medical condition, Ondansetron, Dexamethasone and Midazolam  Airway Management Planned: Oral ETT  Additional Equipment: None  Intra-op Plan:   Post-operative Plan: Extubation in OR  Informed Consent: I have reviewed the patients History and Physical, chart, labs and discussed the procedure including the risks, benefits and alternatives for the proposed anesthesia  with the patient or authorized representative who has indicated his/her understanding and acceptance.     Dental advisory given  Plan Discussed with: CRNA and Surgeon  Anesthesia Plan Comments: (  )       Anesthesia Quick Evaluation

## 2019-05-08 NOTE — ED Provider Notes (Signed)
Emergency Department Provider Note   I have reviewed the triage vital signs and the nursing notes.   HISTORY  Chief Complaint Emesis and Back Pain   HPI ROMEY COHEA is a 53 y.o. female with PMH of HTN, spondylosis, and OSA presents to the emergency department for evaluation of sudden onset nausea vomiting with pain radiating to her back.  Patient states that pain was initially in the left upper quadrant and radiating to the back of her ribs.  She is since having more pain on the right side with continued nausea and vomiting.  She attempted to take Zofran at home but thinks she may have vomited up almost immediately.  She has not been experiencing fevers, chest pain, shortness of breath.  No associated diarrhea symptoms.  No sick contacts.  She states that the pain in her back does not feel typical of her chronic back pain. No radiation of symptoms or modifying factors.   Past Medical History:  Diagnosis Date  . Anxiety   . Arthritis   . Bipolar disorder ([place])   . Depression   . Hypertension   . Sleep apnea    does not use CPAP    Patient Active Problem List   Diagnosis Date Noted  . Other spondylosis with radiculopathy, lumbar region 02/12/2018  . OBSTRUCTIVE SLEEP APNEA 07/27/2007    Past Surgical History:  Procedure Laterality Date  . ABDOMINAL HYSTERECTOMY     10/2010  . ANTERIOR LAT LUMBAR FUSION Left 02/12/2018   Procedure: Lumbar Two - Three Lumbar Three - Four Anterolateral lumbar decompression/fusion with lateral plate;  Surgeon: Kristeen Miss, MD;  Location: [facility];  Service: Neurosurgery;  Laterality: Left;  Lumbar Two - Three Lumbar Three - Four Anterolateral lumbar decompression/fusion with lateral plate  . BACK SURGERY     2011 lumbar 4-5 fusion  . BIOPSY  06/26/2018   Procedure: BIOPSY;  Surgeon: Ronnette Juniper, MD;  Location: [facility];  Service: Gastroenterology;;  EGD and Colon  . BREAST SURGERY  01/2010   breast reduction  . COLONOSCOPY N/A 06/26/2018   Procedure: COLONOSCOPY;  Surgeon: Ronnette Juniper, MD;  Location: [facility];  Service: Gastroenterology;  Laterality: N/A;  . ESOPHAGOGASTRODUODENOSCOPY N/A 06/26/2018   Procedure: ESOPHAGOGASTRODUODENOSCOPY (EGD);  Surgeon: Ronnette Juniper, MD;  Location: [facility];  Service: Gastroenterology;  Laterality: N/A;  . left knee arthroscopic Bilateral    2016 bilateral  . REDUCTION MAMMAPLASTY Bilateral   . TOE SURGERY      Allergies Nsaids, Prednisone, Prozac [fluoxetine hcl], Lithium, Wellbutrin [bupropion], Dilaudid [hydromorphone hcl], Escitalopram oxalate, and Zolpidem tartrate  No family history on file.  Social History Social History   Tobacco Use  . Smoking status: Never Smoker  . Smokeless tobacco: Never Used  Substance Use Topics  . Alcohol use: Not Currently  . Drug use: Yes    Types: Oxycodone    Review of Systems  Constitutional: No fever/chills Eyes: No visual changes. ENT: No sore throat. Cardiovascular: Denies chest pain. Respiratory: Denies shortness of breath. Gastrointestinal: Positive abdominal pain. Positive nausea and vomiting.  No diarrhea.  No constipation. Genitourinary: Negative for dysuria. Musculoskeletal: Positive for back pain. Skin: Negative for rash. Neurological: Negative for headaches, focal weakness or numbness.  10-point ROS otherwise negative.  ____________________________________________   PHYSICAL EXAM:  VITAL SIGNS: ED Triage Vitals  Enc Vitals Group     BP 05/07/19 2252 (!) 190/113     Pulse Rate 05/07/19 2252 (!) 57     Resp 05/07/19 2252  16     Temp 05/07/19 2252 98.3 F (36.8 C)     Temp Source 05/07/19 2252 Oral     SpO2 05/07/19 2252 98 %   Constitutional: Alert and oriented. Well appearing and in no acute distress. Eyes: Conjunctivae are normal. Head: Atraumatic. Nose: No congestion/rhinnorhea. Mouth/Throat: Mucous membranes are moist.   Neck: No stridor.  Cardiovascular: Normal rate, regular rhythm. Good peripheral  circulation. Grossly normal heart sounds.   Respiratory: Normal respiratory effort.  No retractions. Lungs CTAB. Gastrointestinal: Soft with focal tenderness in the RUQ. No rebound. No distention.  Musculoskeletal: No lower extremity tenderness nor edema. No gross deformities of extremities. Neurologic:  Normal speech and language. No gross focal neurologic deficits are appreciated.  Skin:  Skin is warm, dry and intact. No rash noted.  ____________________________________________   LABS (all labs ordered are listed, but only abnormal results are displayed)  Labs Reviewed  COMPREHENSIVE METABOLIC PANEL - Abnormal; Notable for the following components:      Result Value   Chloride 95 (*)    Glucose, Bld 158 (*)    Calcium 10.5 (*)    Total Protein 8.4 (*)    AST 52 (*)    ALT 100 (*)    Alkaline Phosphatase 155 (*)    Anion gap 17 (*)    All other components within normal limits  CBC - Abnormal; Notable for the following components:   Hemoglobin 15.6 (*)    All other components within normal limits  URINALYSIS, ROUTINE W REFLEX MICROSCOPIC - Abnormal; Notable for the following components:   APPearance HAZY (*)    Ketones, ur 5 (*)    Protein, ur >=300 (*)    All other components within normal limits  LIPASE, BLOOD  I-STAT BETA HCG BLOOD, ED ([facility], [facility], [facility] ONLY)  TROPONIN I (HIGH SENSITIVITY)   ____________________________________________  EKG   EKG Interpretation  Date/Time:  Tuesday May 07 2019 22:59:53 EST Ventricular Rate:  62 PR Interval:  168 QRS Duration: 88 QT Interval:  428 QTC Calculation: 434 R Axis:   27 Text Interpretation: Normal sinus rhythm T wave abnormality Abnormal ECG No significant change since last tracing Confirmed by Ripley Fraise [phone]) on 05/07/2019 11:07:20 PM       ____________________________________________  RADIOLOGY  US Abdomen Limited Ruq  Result Date: 05/08/2019 CLINICAL DATA:  53 year old with right upper quadrant  pain. EXAM: ULTRASOUND ABDOMEN LIMITED RIGHT UPPER QUADRANT COMPARISON:  None. FINDINGS: Gallbladder: Gallbladder is moderately distended with echogenic stones. Reportedly, the patient has a sonographic Murphy sign. Largest gallstone measures up to 2.3 cm. No significant gallbladder wall thickening. No evidence for pericholecystic fluid. Echogenic material in the gallbladder is suggestive for sludge. Common bile duct: Diameter: 0.8 cm. Liver: No focal lesion identified. Within normal limits in parenchymal echogenicity. Portal vein is patent on color Doppler imaging with normal direction of blood flow towards the liver. Other: None. IMPRESSION: 1. Cholelithiasis with gallbladder distension and positive sonographic Murphy sign. Findings are suggestive for acute cholecystitis. 2. Common bile duct is prominent for size measuring up to 0.8 cm. Limited evaluation for a common bile duct stone. No significant intrahepatic biliary dilatation. Electronically Signed   By: Markus Daft M.D.   On: 05/08/2019 08:51    ____________________________________________   PROCEDURES  Procedure(s) performed:   Procedures  None ____________________________________________   INITIAL IMPRESSION / ASSESSMENT AND PLAN / ED COURSE  Pertinent labs & imaging results that were available during my care of the patient were reviewed  by me and considered in my medical decision making (see chart for details).   Patient presents to the emergency department with acute onset nausea and vomiting.  She has focal tenderness in the right upper quadrant on my exam.  Her alk phos and LFTs are slightly elevated with normal bilirubin.  No leukocytosis.  Patient with significant elevated blood pressure on my initial evaluation which I suspect is pain related although does carry this diagnosis at home.  Plan for morphine and Zofran along with right upper quadrant ultrasound to start and reassess.   09:00 AM  [place] with evidence of cholelithiasis  and distension with positive Murphy's sign read as concern for acute cholecystitis. Will contact surgery for evaluation. Updated patient and ordered additional pain medication as morphine did not seem to work well. Patient reminds me on a Dilaudid intolerance and her desire to not receive any blood products.   Discussed patient's case with Surgery to request admission. Patient and family (if present) updated with plan. Care transferred to Surgery service.  I reviewed all nursing notes, vitals, pertinent old records, EKGs, labs, imaging (as available).  Spoke with the [facility] at [facility] who will place the rapid COVID test order with surgery likely this afternoon.  ____________________________________________  FINAL CLINICAL IMPRESSION(S) / ED DIAGNOSES  Final diagnoses:  RUQ abdominal pain  Cholecystitis     MEDICATIONS GIVEN DURING THIS VISIT:  Medications  sodium chloride flush (NS) 0.9 % injection 3 mL (has no administration in time range)  fentaNYL (SUBLIMAZE) injection 100 mcg (has no administration in time range)  morphine 4 MG/ML injection 4 mg (4 mg Intravenous Given 05/08/19 0803)  ondansetron (ZOFRAN) injection 4 mg (4 mg Intravenous Given 05/08/19 0803)     Note:  This document was prepared using Dragon voice recognition software and may include unintentional dictation errors.  Nanda Quinton, MD, [facility] Emergency Medicine    , [facility], MD 05/08/19 [phone]

## 2019-05-08 NOTE — Anesthesia Postprocedure Evaluation (Signed)
Anesthesia Post Note  Patient: AMBERLYNN TEMPESTA  Procedure(s) Performed: LAPAROSCOPIC CHOLECYSTECTOMY WITH INTRAOPERATIVE CHOLANGIOGRAM (N/A Abdomen)     Patient location during evaluation: PACU Anesthesia Type: General Level of consciousness: awake and alert, oriented and patient cooperative Pain management: pain level controlled Vital Signs Assessment: post-procedure vital signs reviewed and stable Respiratory status: spontaneous breathing, nonlabored ventilation and respiratory function stable Cardiovascular status: blood pressure returned to baseline and stable Postop Assessment: no apparent nausea or vomiting Anesthetic complications: no Comments: Hypertensive at baseline    Last Vitals:  Vitals:   05/08/19 1435 05/08/19 1450  BP: (!) 198/92 (!) 191/105  Pulse: 98 80  Resp: 16 15  Temp:    SpO2: 96% 94%    Last Pain:  Vitals:   05/08/19 1435  TempSrc:   PainSc: [place]

## 2019-05-08 NOTE — ED Notes (Signed)
Patient advised she has a fentanyl patch on that administers 52mcg/hour. Dr Laverta Baltimore made aware and gave this Rn verbal orders to change 122mcg of fentanyl previously ordered by him to 50 mcg of fentanyl IV.

## 2019-05-08 NOTE — H&P (Signed)
[facility] Surgery Admission Note  Shelley Vasquez January 17, 1966  [id number].    Requesting MD: Long Chief Complaint/Reason for Consult: acute cholecystitis  HPI:  Patient is a 53 year old female who presented to [facility] with abdominal pain since around 10 PM last night. Patient reports pain started in epigastrium with radiation to chest and back right shoulder. Pain now localized to RUQ. Nothing has improved. Associated nausea and bilious emesis. Patient has had some less severe episodes of similar pain that she thought was just indigestion over the last month. Patient denies fever, chills, SOB, diarrhea, urinary symptoms. PMH significant for hx of gastric ulcer, HTN, OSA, Bipolar and depression/anxiety. 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 Allergic to dilaudid, steroids, NSAIDs, and some psychiatric medications. Patient denies alcohol, illicit drug or tobacco use. She is a chronic pain patient and is managed with a fentanyl patch and oxycodone at home. Past abdominal surgery includes an abdominal hysterectomy. Not on any blood thinning medications.   ROS: Review of Systems  Constitutional: Negative for chills and fever.  Respiratory: Negative for shortness of breath and wheezing.   Cardiovascular: Positive for chest pain. Negative for palpitations.  Gastrointestinal: Positive for abdominal pain, heartburn, nausea and vomiting. Negative for diarrhea.  Genitourinary: Negative for dysuria, frequency and urgency.  All other systems reviewed and are negative.   No family history on file.  Past Medical History:  Diagnosis Date  . Anxiety   . Arthritis   . Bipolar disorder ([place])   . Depression   . Hypertension   . Sleep apnea    does not use CPAP    Past Surgical History:  Procedure Laterality Date  . ABDOMINAL HYSTERECTOMY     10/2010  . ANTERIOR LAT LUMBAR FUSION Left 02/12/2018   Procedure: Lumbar Two - Three Lumbar Three - Four  Anterolateral lumbar decompression/fusion with lateral plate;  Surgeon: Kristeen Miss, MD;  Location: [facility];  Service: Neurosurgery;  Laterality: Left;  Lumbar Two - Three Lumbar Three - Four Anterolateral lumbar decompression/fusion with lateral plate  . BACK SURGERY     2011 lumbar 4-5 fusion  . BIOPSY  06/26/2018   Procedure: BIOPSY;  Surgeon: Ronnette Juniper, MD;  Location: [facility];  Service: Gastroenterology;;  EGD and Colon  . BREAST SURGERY  01/2010   breast reduction  . COLONOSCOPY N/A 06/26/2018   Procedure: COLONOSCOPY;  Surgeon: Ronnette Juniper, MD;  Location: [facility];  Service: Gastroenterology;  Laterality: N/A;  . ESOPHAGOGASTRODUODENOSCOPY N/A 06/26/2018   Procedure: ESOPHAGOGASTRODUODENOSCOPY (EGD);  Surgeon: Ronnette Juniper, MD;  Location: [facility];  Service: Gastroenterology;  Laterality: N/A;  . left knee arthroscopic Bilateral    2016 bilateral  . REDUCTION MAMMAPLASTY Bilateral   . TOE SURGERY      Social History:  reports that she has never smoked. She has never used smokeless tobacco. She reports previous alcohol use. She reports current drug use. Drug: Oxycodone.  Allergies:  Allergies  Allergen Reactions  . Nsaids Other (See Comments)    Bleeding ulcer  . Prednisone Other (See Comments)    Suicidal  . Prozac [Fluoxetine Hcl] Anxiety and Hypertension  . Lithium Other (See Comments)    Lithium poisoning   . Wellbutrin [Bupropion]     UNSPECIFIED REACTION   . Dilaudid [Hydromorphone Hcl] Other (See Comments)    Agitation  . Escitalopram Oxalate Other (See Comments)    Shaking  . Zolpidem Tartrate Other (See Comments)  Stay up    (Not in a hospital admission)   Blood pressure (!) 163/106, pulse 77, temperature 98.2 F (36.8 C), temperature source Oral, resp. rate 12, last menstrual period 10/27/2010, SpO2 96 %. Physical Exam: Physical Exam Constitutional:      General: She is not in acute distress.    Appearance: She is well-developed and overweight.   HENT:     Head: Normocephalic and atraumatic.     Right Ear: External ear normal.     Left Ear: External ear normal.     Nose: Nose normal.  Eyes:     General: Lids are normal. No scleral icterus.    Extraocular Movements: Extraocular movements intact.     Conjunctiva/sclera: Conjunctivae normal.  Neck:     Musculoskeletal: Normal range of motion and neck supple.  Cardiovascular:     Rate and Rhythm: Normal rate and regular rhythm.     Pulses:          Radial pulses are 2+ on the right side and 2+ on the left side.       Dorsalis pedis pulses are 2+ on the right side and 2+ on the left side.  Pulmonary:     Effort: Pulmonary effort is normal.     Breath sounds: Normal breath sounds.  Abdominal:     General: Bowel sounds are normal. There is no distension.     Palpations: Abdomen is soft. There is no hepatomegaly or splenomegaly.     Tenderness: There is abdominal tenderness in the right upper quadrant and epigastric area. There is no guarding or rebound. Positive signs include Murphy's sign.  Musculoskeletal:     Comments: ROM grossly intact in bilateral upper and lower extremities  Skin:    General: Skin is warm and dry.     Coloration: Skin is not jaundiced.  Neurological:     Mental Status: She is alert and oriented to person, place, and time.  Psychiatric:        Attention and Perception: Attention and perception normal.        Mood and Affect: Mood and affect normal.        Speech: Speech normal.        Behavior: Behavior is cooperative.     Results for orders placed or performed during the hospital encounter of 05/07/19 (from the past 48 hour(s))  Urinalysis, Routine w reflex microscopic     Status: Abnormal   Collection Time: 05/07/19 10:54 PM  Result Value Ref Range   Color, Urine YELLOW YELLOW   APPearance HAZY (A) CLEAR   Specific Gravity, Urine 1.013 1.005 - 1.030   pH 8.0 5.0 - 8.0   Glucose, UA NEGATIVE NEGATIVE mg/dL   Hgb urine dipstick NEGATIVE  NEGATIVE   Bilirubin Urine NEGATIVE NEGATIVE   Ketones, ur 5 (A) NEGATIVE mg/dL   Protein, ur >=300 (A) NEGATIVE mg/dL   Nitrite NEGATIVE NEGATIVE   Leukocytes,Ua NEGATIVE NEGATIVE   RBC / HPF 0-5 0 - 5 RBC/hpf   WBC, UA 0-5 0 - 5 WBC/hpf   Bacteria, UA NONE SEEN NONE SEEN   Squamous Epithelial / LPF 0-5 0 - 5   Mucus PRESENT     Comment: Performed at [facility], [address] [place], [place] [address]  Lipase, blood     Status: None   Collection Time: 05/07/19 11:25 PM  Result Value Ref Range   Lipase 30 11 - 51 U/L    Comment: Performed at [facility]  [facility], [place] [address], [place], [place] [address]  Comprehensive metabolic panel     Status: Abnormal   Collection Time: 05/07/19 11:25 PM  Result Value Ref Range   Sodium 137 135 - 145 mmol/L   Potassium 3.7 3.5 - 5.1 mmol/L   Chloride 95 (L) 98 - 111 mmol/L   CO2 25 22 - 32 mmol/L   Glucose, Bld 158 (H) 70 - 99 mg/dL   BUN 8 6 - 20 mg/dL   Creatinine, Ser 0.84 0.44 - 1.00 mg/dL   Calcium 10.5 (H) 8.9 - 10.3 mg/dL   Total Protein 8.4 (H) 6.5 - 8.1 g/dL   Albumin 5.0 3.5 - 5.0 g/dL   AST 52 (H) 15 - 41 U/L   ALT 100 (H) 0 - 44 U/L   Alkaline Phosphatase 155 (H) 38 - 126 U/L   Total Bilirubin 1.0 0.3 - 1.2 mg/dL   GFR calc non Af Amer >60 >60 mL/min   GFR calc Af Amer >60 >60 mL/min   Anion gap 17 (H) 5 - 15    Comment: Performed at [facility], [place] [address] [place], [place] [address]  CBC     Status: Abnormal   Collection Time: 05/07/19 11:25 PM  Result Value Ref Range   WBC 9.5 4.0 - 10.5 K/uL   RBC 4.94 3.87 - 5.11 MIL/uL   Hemoglobin 15.6 (H) 12.0 - 15.0 g/dL   HCT 45.1 36.0 - 46.0 %   MCV 91.3 80.0 - 100.0 fL   MCH 31.6 26.0 - 34.0 pg   MCHC 34.6 30.0 - 36.0 g/dL   RDW 12.4 11.5 - 15.5 %   Platelets 395 150 - 400 K/uL   nRBC 0.0 0.0 - 0.2 %    Comment: Performed at [facility], [place] [address] [place], [place] [address]  Troponin I (High Sensitivity)     Status: None   Collection  Time: 05/07/19 11:25 PM  Result Value Ref Range   Troponin I (High Sensitivity) 17 <18 ng/L    Comment: (NOTE) Elevated high sensitivity troponin I (hsTnI) values and significant  changes across serial measurements may suggest ACS but many other  chronic and acute conditions are known to elevate hsTnI results.  Refer to the "Links" section for chest pain algorithms and additional  guidance. Performed at [facility], [place] [address], [place], [place] [address]   I-Stat beta hCG blood, ED     Status: None   Collection Time: 05/07/19 11:51 PM  Result Value Ref Range   I-stat hCG, quantitative <5.0 <5 mIU/mL   Comment 3            Comment:   GEST. AGE      CONC.  (mIU/mL)   <=1 WEEK        5 - 50     2 WEEKS       50 - 500     3 WEEKS       100 - 10,000     4 WEEKS     1,000 - 30,000        FEMALE AND NON-PREGNANT FEMALE:     LESS THAN 5 mIU/mL   SARS Coronavirus 2 by RT PCR (hospital order, performed in [place] hospital lab) Nasopharyngeal Nasopharyngeal Swab     Status: None   Collection Time: 05/08/19  9:32 AM   Specimen: Nasopharyngeal Swab  Result Value Ref Range   SARS Coronavirus 2  NEGATIVE NEGATIVE    Comment: (NOTE) If result is NEGATIVE SARS-CoV-2 target nucleic acids are NOT DETECTED. The SARS-CoV-2 RNA is generally detectable in upper and lower  respiratory specimens during the acute phase of infection. The lowest  concentration of SARS-CoV-2 viral copies this assay can detect is 250  copies / mL. A negative result does not preclude SARS-CoV-2 infection  and should not be used as the sole basis for treatment or other  patient management decisions.  A negative result may occur with  improper specimen collection / handling, submission of specimen other  than nasopharyngeal swab, presence of viral mutation(s) within the  areas targeted by this assay, and inadequate number of viral copies  (<250 copies / mL). A negative result must be combined with clinical   observations, patient history, and epidemiological information. If result is POSITIVE SARS-CoV-2 target nucleic acids are DETECTED. The SARS-CoV-2 RNA is generally detectable in upper and lower  respiratory specimens dur ing the acute phase of infection.  Positive  results are indicative of active infection with SARS-CoV-2.  Clinical  correlation with patient history and other diagnostic information is  necessary to determine patient infection status.  Positive results do  not rule out bacterial infection or co-infection with other viruses. If result is PRESUMPTIVE POSTIVE SARS-CoV-2 nucleic acids MAY BE PRESENT.   A presumptive positive result was obtained on the submitted specimen  and confirmed on repeat testing.  While 2019 novel coronavirus  (SARS-CoV-2) nucleic acids may be present in the submitted sample  additional confirmatory testing may be necessary for epidemiological  and / or clinical management purposes  to differentiate between  SARS-CoV-2 and other Sarbecovirus currently known to infect humans.  If clinically indicated additional testing with an alternate test  methodology [id number]) is advised. The SARS-CoV-2 RNA is generally  detectable in upper and lower respiratory sp ecimens during the acute  phase of infection. The expected result is Negative. Fact Sheet for Patients:  StrictlyIdeas.no Fact Sheet for Healthcare Providers: BankingDealers.co.za This test is not yet approved or cleared by the [place] FDA and has been authorized for detection and/or diagnosis of SARS-CoV-2 by FDA under an Emergency Use Authorization (EUA).  This EUA will remain in effect (meaning this test can be used) for the duration of the COVID-19 declaration under Section 564(b)(1) of the Act, 21 U.S.C. section 360bbb-3(b)(1), unless the authorization is terminated or revoked sooner. Performed at [facility], [place] [address],  [place], [place] [address]    US Abdomen Limited Ruq  Result Date: 05/08/2019 CLINICAL DATA:  53 year old with right upper quadrant pain. EXAM: ULTRASOUND ABDOMEN LIMITED RIGHT UPPER QUADRANT COMPARISON:  None. FINDINGS: Gallbladder: Gallbladder is moderately distended with echogenic stones. Reportedly, the patient has a sonographic Murphy sign. Largest gallstone measures up to 2.3 cm. No significant gallbladder wall thickening. No evidence for pericholecystic fluid. Echogenic material in the gallbladder is suggestive for sludge. Common bile duct: Diameter: 0.8 cm. Liver: No focal lesion identified. Within normal limits in parenchymal echogenicity. Portal vein is patent on color Doppler imaging with normal direction of blood flow towards the liver. Other: None. IMPRESSION: 1. Cholelithiasis with gallbladder distension and positive sonographic Murphy sign. Findings are suggestive for acute cholecystitis. 2. Common bile duct is prominent for size measuring up to 0.8 cm. Limited evaluation for a common bile duct stone. No significant intrahepatic biliary dilatation. Electronically Signed   By: Markus Daft M.D.   On: 05/08/2019 08:51      Assessment/Plan Jehovah's  Witness - does not accept any blood products HTN OSA Bipolar disorder Depression/Anxiety Chronic pain - on fentanyl patch and oxycodone at home   Acute cholecystitis - [place]: cholelithiasis with gallbladder distention and +Murphy sign - exam suggestive of acute cholecystitis - WBC 9.5; AST/ALT 52/100, Alk Phos 155, Tbili 1.0 - to OR for lap chole and admit to floor post-op   Brigid Re, [facility] Surgery 05/08/2019, 10:55 AM Please see Amion for pager number during day hours 7:00am-4:30pm

## 2019-05-08 NOTE — Anesthesia Procedure Notes (Signed)
Procedure Name: Intubation Date/Time: 05/08/2019 1:05 PM Performed by: Babs Bertin, CRNA Pre-anesthesia Checklist: Patient identified, Emergency Drugs available, Suction available and Patient being monitored Patient Re-evaluated:Patient Re-evaluated prior to induction Oxygen Delivery Method: Circle System Utilized Preoxygenation: Pre-oxygenation with 100% oxygen Induction Type: IV induction Ventilation: Mask ventilation without difficulty Laryngoscope Size: Mac and 3 Grade View: Grade I Tube type: Oral Tube size: 7.0 mm Number of attempts: 1 Airway Equipment and Method: Stylet and Oral airway Placement Confirmation: ETT inserted through vocal cords under direct vision,  positive ETCO2 and breath sounds checked- equal and bilateral Secured at: 21 cm Tube secured with: Tape Dental Injury: Teeth and Oropharynx as per pre-operative assessment

## 2019-05-08 NOTE — ED Notes (Signed)
Patient transported to Ultrasound 

## 2019-05-08 NOTE — Transfer of Care (Signed)
Immediate Anesthesia Transfer of Care Note  Patient: Shelley Vasquez  Procedure(s) Performed: LAPAROSCOPIC CHOLECYSTECTOMY WITH INTRAOPERATIVE CHOLANGIOGRAM (N/A Abdomen)  Patient Location: PACU  Anesthesia Type:General  Level of Consciousness: awake, alert  and oriented  Airway & Oxygen Therapy: Patient Spontanous Breathing  Post-op Assessment: Report given to RN and Post -op Vital signs reviewed and stable  Post vital signs: Reviewed and stable  Last Vitals:  Vitals Value Taken Time  BP    Temp    Pulse 99 05/08/19 1401  Resp 11 05/08/19 1401  SpO2 97 % 05/08/19 1401  Vitals shown include unvalidated device data.  Last Pain:  Vitals:   05/08/19 1135  TempSrc:   PainSc: 9          Complications: No apparent anesthesia complications

## 2019-05-08 NOTE — ED Notes (Signed)
ED Provider at bedside. 

## 2019-05-08 NOTE — Plan of Care (Signed)

## 2019-05-09 ENCOUNTER — Encounter (HOSPITAL_COMMUNITY): Payer: Self-pay | Admitting: Surgery

## 2019-05-09 LAB — COMPREHENSIVE METABOLIC PANEL
ALT: 79 U/L — ABNORMAL HIGH (ref 0–44)
AST: 62 U/L — ABNORMAL HIGH (ref 15–41)
Albumin: 3.9 g/dL (ref 3.5–5.0)
Alkaline Phosphatase: 114 U/L (ref 38–126)
Anion gap: 12 (ref 5–15)
BUN: 11 mg/dL (ref 6–20)
CO2: 26 mmol/L (ref 22–32)
Calcium: 9.5 mg/dL (ref 8.9–10.3)
Chloride: 97 mmol/L — ABNORMAL LOW (ref 98–111)
Creatinine, Ser: 0.92 mg/dL (ref 0.44–1.00)
GFR calc Af Amer: >60 mL/min (ref >60)
GFR calc non Af Amer: >60 mL/min (ref >60)
Glucose, Bld: 147 mg/dL — ABNORMAL HIGH (ref 70–99)
Potassium: 3.7 mmol/L (ref 3.5–5.1)
Sodium: 135 mmol/L (ref 135–145)
Total Bilirubin: 1.1 mg/dL (ref 0.3–1.2)
Total Protein: 7 g/dL (ref 6.5–8.1)

## 2019-05-09 LAB — CBC
HCT: 41.7 % (ref 36.0–46.0)
Hemoglobin: 14.4 g/dL (ref 12.0–15.0)
MCH: 31.3 pg (ref 26.0–34.0)
MCHC: 34.5 g/dL (ref 30.0–36.0)
MCV: 90.7 fL (ref 80.0–100.0)
Platelets: 331 10*3/uL (ref 150–400)
RBC: 4.6 MIL/uL (ref 3.87–5.11)
RDW: 12.7 % (ref 11.5–15.5)
WBC: 14.7 10*3/uL — ABNORMAL HIGH (ref 4.0–10.5)
nRBC: 0 % (ref 0.0–0.2)

## 2019-05-09 NOTE — Progress Notes (Signed)
AVS given and reviewed with pt. Medications discussed. All questions answered to satisfaction. Pt verbalized understanding of information given. Pt escorted off the unit with all belongings via wheelchair by volunteer services.  

## 2019-05-09 NOTE — Progress Notes (Signed)
Patient ID: Shelley Vasquez, female   DOB: 1966/06/06, 53 y.o.   MRN: [id number]   Doing well Pain much improved Abdomen soft LFt's down  Plan:  Discharge home

## 2019-05-09 NOTE — Plan of Care (Signed)
  Problem: Education: Goal: Knowledge of General Education information will improve Description: Including pain rating scale, medication(s)/side effects and non-pharmacologic comfort measures Outcome: Progressing   Problem: Health Behavior/Discharge Planning: Goal: Ability to manage health-related needs will improve Outcome: Progressing   Problem: Clinical Measurements: Goal: Ability to maintain clinical measurements within normal limits will improve Outcome: Progressing Goal: Respiratory complications will improve Outcome: Progressing   Problem: Nutrition: Goal: Adequate nutrition will be maintained Outcome: Progressing   Problem: Coping: Goal: Level of anxiety will decrease Outcome: Progressing   Problem: Elimination: Goal: Will not experience complications related to urinary retention Outcome: Progressing   Problem: Pain Managment: Goal: General experience of comfort will improve Outcome: Progressing   Problem: Safety: Goal: Ability to remain free from injury will improve Outcome: Progressing   Problem: Skin Integrity: Goal: Risk for impaired skin integrity will decrease Outcome: Progressing

## 2019-05-09 NOTE — Discharge Summary (Signed)
Physician Discharge Summary  Patient ID: Shelley Vasquez MRN: [id number] DOB/AGE: 09-24-1965 53 y.o.  Admit date: 05/07/2019 Discharge date: 05/09/2019  Admission Diagnoses:  Discharge Diagnoses:  Active Problems:   Acute cholecystitis   Discharged Condition: good  Hospital Course: uneventful post op recovery.  Discharged home pod#1  Consults: None  Significant Diagnostic Studies:   Treatments: surgery: laparoscopic cholecystectomy  Discharge Exam: Blood pressure (!) 167/82, pulse 64, temperature 99.1 F (37.3 C), temperature source Oral, resp. rate 18, height 5\' 6"  (1.676 m), weight 99.8 kg, last menstrual period 10/27/2010, SpO2 96 %. General appearance: alert, cooperative and no distress Resp: clear to auscultation bilaterally Cardio: regular rate and rhythm, S1, S2 normal, no murmur, click, rub or gallop Incision/Wound:abdomen soft, incisions clean  Disposition: Discharge disposition: 01-Home or Self Care        Allergies as of 05/09/2019      Reactions   Nsaids Other (See Comments)   Bleeding ulcer   Prednisone Other (See Comments)   Suicidal   Prozac [fluoxetine Hcl] Anxiety, Hypertension   Lithium Other (See Comments)   Lithium poisoning    Wellbutrin [bupropion]    UNSPECIFIED REACTION    Dilaudid [hydromorphone Hcl] Other (See Comments)   Agitation   Escitalopram Oxalate Other (See Comments)   Shaking   Zolpidem Tartrate Other (See Comments)   Stay up      Medication List    TAKE these medications   betamethasone valerate 0.1 % cream Commonly known as: VALISONE Apply 1 application topically See admin instructions. Up to twice a week as needed for vaginal dryness.   celecoxib 200 MG capsule Commonly known as: CELEBREX Take 200 mg by mouth daily.   clonazePAM 1 MG tablet Commonly known as: KLONOPIN Take 1 mg by mouth See admin instructions. Take 1 tablet (1 mg) by mouth midday if needed for anxiety & take up 2 tablets (2 mg) by mouth at  bedtime as needed for sleep.   docusate sodium 100 MG capsule Commonly known as: COLACE Take 400 mg by mouth 2 (two) times daily.   fentaNYL 25 MCG/HR Commonly known as: DURAGESIC Place 25 mcg onto the skin every other day.   hyoscyamine 0.125 MG SL tablet Commonly known as: LEVSIN SL Place 0.125 mg under the tongue every 4 (four) hours as needed for cramping (difficulty swallowing.).   Intuniv 4 MG Tb24 ER tablet Generic drug: guanFACINE Take 4 mg by mouth daily.   lamoTRIgine 200 MG tablet Commonly known as: LAMICTAL Take 400 mg by mouth at bedtime.   Magnesium 250 MG Tabs Take 250-500 mg by mouth 2 (two) times daily as needed (constipation).   MAGNESIUM CITRATE PO Take 1 spray by mouth 3 (three) times daily as needed (for muscle cramps).   ondansetron 8 MG disintegrating tablet Commonly known as: ZOFRAN-ODT Take 8 mg by mouth every 8 (eight) hours as needed for nausea or vomiting.   OVER THE COUNTER MEDICATION Take 0.5 tablets by mouth 2 (two) times daily. Avocado SoyBean   oxyCODONE-acetaminophen 5-325 MG tablet Commonly known as: PERCOCET/ROXICET Take 1 tablet by mouth 4 (four) times daily.   pantoprazole 40 MG tablet Commonly known as: PROTONIX Take 40 mg by mouth 2 (two) times daily.   rizatriptan 10 MG tablet Commonly known as: MAXALT Take 10 mg by mouth as needed for migraine. May repeat in 2 hours if needed   tiZANidine 4 MG tablet Commonly known as: ZANAFLEX Take 4-8 mg by mouth See admin instructions. Take 1  tablet (4 mg) by mouth in the morning & take 3 tablets by mouth at night.   traZODone 50 MG tablet Commonly known as: DESYREL Take 100 mg by mouth at bedtime.   triamcinolone cream 0.5 % Commonly known as: KENALOG Apply 1 application topically 2 (two) times daily as needed (for dry/cracking skin on hands.).   triamterene-hydrochlorothiazide 37.5-25 MG tablet Commonly known as: MAXZIDE-25 Take 0.5 tablets by mouth daily.   Trintellix 5 MG  Tabs tablet Generic drug: vortioxetine HBr Take 5 mg by mouth daily.   Viibryd 20 MG Tabs Generic drug: Vilazodone HCl Take 1 tablet by mouth daily.      Follow-up Information    Surgery, [place]. Go on 05/28/2019.   Specialty: General Surgery Why: Follow up appointment scheduled for 9:45 AM. Please arrive 30 min prior to appointment time. Bring photo ID and insurance information.  Contact information: [place] [address] [phone]           Signed: Coralie Keens 05/09/2019, 7:40 AM

## 2019-05-10 LAB — SURGICAL PATHOLOGY

## 2019-05-20 DIAGNOSIS — M174 Other bilateral secondary osteoarthritis of knee: Secondary | ICD-10-CM | POA: Diagnosis not present

## 2019-05-20 DIAGNOSIS — G894 Chronic pain syndrome: Secondary | ICD-10-CM | POA: Diagnosis not present

## 2019-05-20 DIAGNOSIS — M47816 Spondylosis without myelopathy or radiculopathy, lumbar region: Secondary | ICD-10-CM | POA: Diagnosis not present

## 2019-05-20 DIAGNOSIS — M40204 Unspecified kyphosis, thoracic region: Secondary | ICD-10-CM | POA: Diagnosis not present

## 2019-06-03 DIAGNOSIS — F4322 Adjustment disorder with anxiety: Secondary | ICD-10-CM | POA: Diagnosis not present

## 2019-07-01 DIAGNOSIS — F4322 Adjustment disorder with anxiety: Secondary | ICD-10-CM | POA: Diagnosis not present

## 2019-07-15 DIAGNOSIS — G894 Chronic pain syndrome: Secondary | ICD-10-CM | POA: Diagnosis not present

## 2019-07-15 DIAGNOSIS — M40204 Unspecified kyphosis, thoracic region: Secondary | ICD-10-CM | POA: Diagnosis not present

## 2019-07-15 DIAGNOSIS — M47816 Spondylosis without myelopathy or radiculopathy, lumbar region: Secondary | ICD-10-CM | POA: Diagnosis not present

## 2019-07-15 DIAGNOSIS — Z9013 Acquired absence of bilateral breasts and nipples: Secondary | ICD-10-CM | POA: Diagnosis not present

## 2019-07-15 DIAGNOSIS — M174 Other bilateral secondary osteoarthritis of knee: Secondary | ICD-10-CM | POA: Diagnosis not present

## 2019-07-16 DIAGNOSIS — E785 Hyperlipidemia, unspecified: Secondary | ICD-10-CM | POA: Diagnosis not present

## 2019-07-16 DIAGNOSIS — F319 Bipolar disorder, unspecified: Secondary | ICD-10-CM | POA: Diagnosis not present

## 2019-07-16 DIAGNOSIS — Z0000 Encounter for general adult medical examination without abnormal findings: Secondary | ICD-10-CM | POA: Diagnosis not present

## 2019-07-16 DIAGNOSIS — G894 Chronic pain syndrome: Secondary | ICD-10-CM | POA: Diagnosis not present

## 2019-07-16 DIAGNOSIS — I10 Essential (primary) hypertension: Secondary | ICD-10-CM | POA: Diagnosis not present

## 2019-07-16 DIAGNOSIS — K219 Gastro-esophageal reflux disease without esophagitis: Secondary | ICD-10-CM | POA: Diagnosis not present

## 2019-07-16 DIAGNOSIS — L989 Disorder of the skin and subcutaneous tissue, unspecified: Secondary | ICD-10-CM | POA: Diagnosis not present

## 2019-07-16 DIAGNOSIS — Z79899 Other long term (current) drug therapy: Secondary | ICD-10-CM | POA: Diagnosis not present

## 2019-07-16 DIAGNOSIS — E282 Polycystic ovarian syndrome: Secondary | ICD-10-CM | POA: Diagnosis not present

## 2019-07-17 DIAGNOSIS — Z79899 Other long term (current) drug therapy: Secondary | ICD-10-CM | POA: Diagnosis not present

## 2019-07-17 DIAGNOSIS — E282 Polycystic ovarian syndrome: Secondary | ICD-10-CM | POA: Diagnosis not present

## 2019-07-17 DIAGNOSIS — K219 Gastro-esophageal reflux disease without esophagitis: Secondary | ICD-10-CM | POA: Diagnosis not present

## 2019-07-17 DIAGNOSIS — I10 Essential (primary) hypertension: Secondary | ICD-10-CM | POA: Diagnosis not present

## 2019-07-17 DIAGNOSIS — E785 Hyperlipidemia, unspecified: Secondary | ICD-10-CM | POA: Diagnosis not present

## 2019-07-29 DIAGNOSIS — F3342 Major depressive disorder, recurrent, in full remission: Secondary | ICD-10-CM | POA: Diagnosis not present

## 2019-08-09 DIAGNOSIS — Z79891 Long term (current) use of opiate analgesic: Secondary | ICD-10-CM | POA: Diagnosis not present

## 2019-08-12 DIAGNOSIS — F4322 Adjustment disorder with anxiety: Secondary | ICD-10-CM | POA: Diagnosis not present

## 2019-08-27 DIAGNOSIS — F319 Bipolar disorder, unspecified: Secondary | ICD-10-CM | POA: Diagnosis not present

## 2019-08-27 DIAGNOSIS — I10 Essential (primary) hypertension: Secondary | ICD-10-CM | POA: Diagnosis not present

## 2019-08-27 DIAGNOSIS — M1712 Unilateral primary osteoarthritis, left knee: Secondary | ICD-10-CM | POA: Diagnosis not present

## 2019-08-27 DIAGNOSIS — E785 Hyperlipidemia, unspecified: Secondary | ICD-10-CM | POA: Diagnosis not present

## 2019-09-09 DIAGNOSIS — M40204 Unspecified kyphosis, thoracic region: Secondary | ICD-10-CM | POA: Diagnosis not present

## 2019-09-09 DIAGNOSIS — M174 Other bilateral secondary osteoarthritis of knee: Secondary | ICD-10-CM | POA: Diagnosis not present

## 2019-09-09 DIAGNOSIS — M47816 Spondylosis without myelopathy or radiculopathy, lumbar region: Secondary | ICD-10-CM | POA: Diagnosis not present

## 2019-09-09 DIAGNOSIS — G894 Chronic pain syndrome: Secondary | ICD-10-CM | POA: Diagnosis not present

## 2019-09-19 ENCOUNTER — Ambulatory Visit: Payer: PPO | Attending: Internal Medicine

## 2019-09-19 DIAGNOSIS — Z23 Encounter for immunization: Secondary | ICD-10-CM

## 2019-09-19 NOTE — Progress Notes (Signed)
   Covid-19 Vaccination Clinic  Name:  ROMESHA SCHERER    MRN: [id number] DOB: 04/09/1966  09/19/2019  Ms. Hallett was observed post Covid-19 immunization for 15 minutes without incident. She was provided with Vaccine Information Sheet and instruction to access the V-Safe system.   Ms. Bodin was instructed to call 911 with any severe reactions post vaccine: [place] Kitchen Difficulty breathing  . Swelling of face and throat  . A fast heartbeat  . A bad rash all over body  . Dizziness and weakness   Immunizations Administered    Name Date Dose VIS Date Route   Pfizer COVID-19 Vaccine 09/19/2019  1:09 PM 0.3 mL 05/31/2019 Intramuscular   Manufacturer: [place]   Lot: [id number]   [place]: [id number]

## 2019-09-23 DIAGNOSIS — F4322 Adjustment disorder with anxiety: Secondary | ICD-10-CM | POA: Diagnosis not present

## 2019-10-04 DIAGNOSIS — I10 Essential (primary) hypertension: Secondary | ICD-10-CM | POA: Diagnosis not present

## 2019-10-04 DIAGNOSIS — M1712 Unilateral primary osteoarthritis, left knee: Secondary | ICD-10-CM | POA: Diagnosis not present

## 2019-10-04 DIAGNOSIS — F319 Bipolar disorder, unspecified: Secondary | ICD-10-CM | POA: Diagnosis not present

## 2019-10-04 DIAGNOSIS — E785 Hyperlipidemia, unspecified: Secondary | ICD-10-CM | POA: Diagnosis not present

## 2019-10-14 ENCOUNTER — Ambulatory Visit: Payer: PPO | Attending: Internal Medicine

## 2019-10-14 ENCOUNTER — Ambulatory Visit
Admission: RE | Admit: 2019-10-14 | Discharge: 2019-10-14 | Disposition: A | Discharge status: Home / Self Care | Payer: PPO | Source: Ambulatory Visit | Attending: Family Medicine | Admitting: Family Medicine

## 2019-10-14 ENCOUNTER — Other Ambulatory Visit: Payer: Self-pay

## 2019-10-14 ENCOUNTER — Other Ambulatory Visit: Payer: Self-pay | Admitting: Family Medicine

## 2019-10-14 DIAGNOSIS — Z23 Encounter for immunization: Secondary | ICD-10-CM

## 2019-10-14 DIAGNOSIS — M542 Cervicalgia: Secondary | ICD-10-CM

## 2019-10-14 DIAGNOSIS — R519 Headache, unspecified: Secondary | ICD-10-CM | POA: Diagnosis not present

## 2019-10-14 DIAGNOSIS — L989 Disorder of the skin and subcutaneous tissue, unspecified: Secondary | ICD-10-CM | POA: Diagnosis not present

## 2019-10-14 DIAGNOSIS — M549 Dorsalgia, unspecified: Secondary | ICD-10-CM

## 2019-10-14 DIAGNOSIS — M47814 Spondylosis without myelopathy or radiculopathy, thoracic region: Secondary | ICD-10-CM | POA: Diagnosis not present

## 2019-10-14 DIAGNOSIS — M546 Pain in thoracic spine: Secondary | ICD-10-CM | POA: Diagnosis not present

## 2019-10-14 NOTE — Progress Notes (Signed)
   Covid-19 Vaccination Clinic  Name:  Shelley Vasquez    MRN: [id number] DOB: 21-Mar-1966  10/14/2019  Shelley Vasquez was observed post Covid-19 immunization for 15 minutes without incident. She was provided with Vaccine Information Sheet and instruction to access the V-Safe system.   Shelley Vasquez was instructed to call 911 with any severe reactions post vaccine: [place] Kitchen Difficulty breathing  . Swelling of face and throat  . A fast heartbeat  . A bad rash all over body  . Dizziness and weakness   Immunizations Administered    Name Date Dose VIS Date Route   Pfizer COVID-19 Vaccine 10/14/2019 12:58 PM 0.3 mL 08/14/2018 Intramuscular   Manufacturer: [place]   Lot: [id number]   [place]: [id number]

## 2019-11-04 DIAGNOSIS — M47816 Spondylosis without myelopathy or radiculopathy, lumbar region: Secondary | ICD-10-CM | POA: Diagnosis not present

## 2019-11-04 DIAGNOSIS — M174 Other bilateral secondary osteoarthritis of knee: Secondary | ICD-10-CM | POA: Diagnosis not present

## 2019-11-04 DIAGNOSIS — G894 Chronic pain syndrome: Secondary | ICD-10-CM | POA: Diagnosis not present

## 2019-11-04 DIAGNOSIS — Z79891 Long term (current) use of opiate analgesic: Secondary | ICD-10-CM | POA: Diagnosis not present

## 2019-11-04 DIAGNOSIS — M40204 Unspecified kyphosis, thoracic region: Secondary | ICD-10-CM | POA: Diagnosis not present

## 2019-11-05 DIAGNOSIS — F4322 Adjustment disorder with anxiety: Secondary | ICD-10-CM | POA: Diagnosis not present

## 2019-11-06 DIAGNOSIS — I10 Essential (primary) hypertension: Secondary | ICD-10-CM | POA: Diagnosis not present

## 2019-11-11 DIAGNOSIS — R609 Edema, unspecified: Secondary | ICD-10-CM | POA: Diagnosis not present

## 2019-11-11 DIAGNOSIS — F419 Anxiety disorder, unspecified: Secondary | ICD-10-CM | POA: Diagnosis not present

## 2019-11-11 DIAGNOSIS — I10 Essential (primary) hypertension: Secondary | ICD-10-CM | POA: Diagnosis not present

## 2019-11-15 DIAGNOSIS — I10 Essential (primary) hypertension: Secondary | ICD-10-CM | POA: Diagnosis not present

## 2019-11-15 DIAGNOSIS — M1712 Unilateral primary osteoarthritis, left knee: Secondary | ICD-10-CM | POA: Diagnosis not present

## 2019-11-15 DIAGNOSIS — F319 Bipolar disorder, unspecified: Secondary | ICD-10-CM | POA: Diagnosis not present

## 2019-11-15 DIAGNOSIS — E785 Hyperlipidemia, unspecified: Secondary | ICD-10-CM | POA: Diagnosis not present

## 2019-12-16 DIAGNOSIS — F4322 Adjustment disorder with anxiety: Secondary | ICD-10-CM | POA: Diagnosis not present

## 2020-01-06 DIAGNOSIS — M40204 Unspecified kyphosis, thoracic region: Secondary | ICD-10-CM | POA: Diagnosis not present

## 2020-01-06 DIAGNOSIS — G894 Chronic pain syndrome: Secondary | ICD-10-CM | POA: Diagnosis not present

## 2020-01-06 DIAGNOSIS — M174 Other bilateral secondary osteoarthritis of knee: Secondary | ICD-10-CM | POA: Diagnosis not present

## 2020-01-06 DIAGNOSIS — M47816 Spondylosis without myelopathy or radiculopathy, lumbar region: Secondary | ICD-10-CM | POA: Diagnosis not present

## 2020-01-07 DIAGNOSIS — M25552 Pain in left hip: Secondary | ICD-10-CM | POA: Diagnosis not present

## 2020-01-07 DIAGNOSIS — G894 Chronic pain syndrome: Secondary | ICD-10-CM | POA: Diagnosis not present

## 2020-01-07 DIAGNOSIS — S39012A Strain of muscle, fascia and tendon of lower back, initial encounter: Secondary | ICD-10-CM | POA: Diagnosis not present

## 2020-01-07 DIAGNOSIS — M5136 Other intervertebral disc degeneration, lumbar region: Secondary | ICD-10-CM | POA: Diagnosis not present

## 2020-01-08 DIAGNOSIS — F3342 Major depressive disorder, recurrent, in full remission: Secondary | ICD-10-CM | POA: Diagnosis not present

## 2020-01-27 DIAGNOSIS — F4322 Adjustment disorder with anxiety: Secondary | ICD-10-CM | POA: Diagnosis not present

## 2020-02-18 DIAGNOSIS — F319 Bipolar disorder, unspecified: Secondary | ICD-10-CM | POA: Diagnosis not present

## 2020-02-18 DIAGNOSIS — M1712 Unilateral primary osteoarthritis, left knee: Secondary | ICD-10-CM | POA: Diagnosis not present

## 2020-02-18 DIAGNOSIS — E785 Hyperlipidemia, unspecified: Secondary | ICD-10-CM | POA: Diagnosis not present

## 2020-02-18 DIAGNOSIS — I10 Essential (primary) hypertension: Secondary | ICD-10-CM | POA: Diagnosis not present

## 2020-02-25 DIAGNOSIS — F4322 Adjustment disorder with anxiety: Secondary | ICD-10-CM | POA: Diagnosis not present

## 2020-03-03 DIAGNOSIS — G894 Chronic pain syndrome: Secondary | ICD-10-CM | POA: Diagnosis not present

## 2020-03-03 DIAGNOSIS — M40204 Unspecified kyphosis, thoracic region: Secondary | ICD-10-CM | POA: Diagnosis not present

## 2020-03-03 DIAGNOSIS — M174 Other bilateral secondary osteoarthritis of knee: Secondary | ICD-10-CM | POA: Diagnosis not present

## 2020-03-03 DIAGNOSIS — M47816 Spondylosis without myelopathy or radiculopathy, lumbar region: Secondary | ICD-10-CM | POA: Diagnosis not present

## 2020-03-04 DIAGNOSIS — M542 Cervicalgia: Secondary | ICD-10-CM | POA: Diagnosis not present

## 2020-03-04 DIAGNOSIS — M4326 Fusion of spine, lumbar region: Secondary | ICD-10-CM | POA: Diagnosis not present

## 2020-03-10 DIAGNOSIS — M4326 Fusion of spine, lumbar region: Secondary | ICD-10-CM | POA: Diagnosis not present

## 2020-03-10 DIAGNOSIS — M542 Cervicalgia: Secondary | ICD-10-CM | POA: Diagnosis not present

## 2020-03-11 DIAGNOSIS — M47816 Spondylosis without myelopathy or radiculopathy, lumbar region: Secondary | ICD-10-CM | POA: Diagnosis not present

## 2020-03-12 DIAGNOSIS — M542 Cervicalgia: Secondary | ICD-10-CM | POA: Diagnosis not present

## 2020-03-12 DIAGNOSIS — M4326 Fusion of spine, lumbar region: Secondary | ICD-10-CM | POA: Diagnosis not present

## 2020-03-13 DIAGNOSIS — Z23 Encounter for immunization: Secondary | ICD-10-CM | POA: Diagnosis not present

## 2020-03-13 DIAGNOSIS — I10 Essential (primary) hypertension: Secondary | ICD-10-CM | POA: Diagnosis not present

## 2020-03-13 DIAGNOSIS — G894 Chronic pain syndrome: Secondary | ICD-10-CM | POA: Diagnosis not present

## 2020-03-13 DIAGNOSIS — M542 Cervicalgia: Secondary | ICD-10-CM | POA: Diagnosis not present

## 2020-03-19 DIAGNOSIS — M542 Cervicalgia: Secondary | ICD-10-CM | POA: Diagnosis not present

## 2020-03-19 DIAGNOSIS — M4326 Fusion of spine, lumbar region: Secondary | ICD-10-CM | POA: Diagnosis not present

## 2020-03-24 DIAGNOSIS — M4326 Fusion of spine, lumbar region: Secondary | ICD-10-CM | POA: Diagnosis not present

## 2020-03-24 DIAGNOSIS — M542 Cervicalgia: Secondary | ICD-10-CM | POA: Diagnosis not present

## 2020-03-30 ENCOUNTER — Other Ambulatory Visit: Payer: Self-pay | Admitting: Family Medicine

## 2020-03-30 DIAGNOSIS — F4322 Adjustment disorder with anxiety: Secondary | ICD-10-CM | POA: Diagnosis not present

## 2020-03-30 DIAGNOSIS — Z1231 Encounter for screening mammogram for malignant neoplasm of breast: Secondary | ICD-10-CM

## 2020-04-02 DIAGNOSIS — M542 Cervicalgia: Secondary | ICD-10-CM | POA: Diagnosis not present

## 2020-04-02 DIAGNOSIS — M4326 Fusion of spine, lumbar region: Secondary | ICD-10-CM | POA: Diagnosis not present

## 2020-04-06 DIAGNOSIS — M4326 Fusion of spine, lumbar region: Secondary | ICD-10-CM | POA: Diagnosis not present

## 2020-04-06 DIAGNOSIS — M542 Cervicalgia: Secondary | ICD-10-CM | POA: Diagnosis not present

## 2020-04-08 DIAGNOSIS — M4326 Fusion of spine, lumbar region: Secondary | ICD-10-CM | POA: Diagnosis not present

## 2020-04-08 DIAGNOSIS — M542 Cervicalgia: Secondary | ICD-10-CM | POA: Diagnosis not present

## 2020-04-10 DIAGNOSIS — Z9013 Acquired absence of bilateral breasts and nipples: Secondary | ICD-10-CM | POA: Diagnosis not present

## 2020-04-13 DIAGNOSIS — M542 Cervicalgia: Secondary | ICD-10-CM | POA: Diagnosis not present

## 2020-04-13 DIAGNOSIS — M4326 Fusion of spine, lumbar region: Secondary | ICD-10-CM | POA: Diagnosis not present

## 2020-04-16 DIAGNOSIS — M47896 Other spondylosis, lumbar region: Secondary | ICD-10-CM | POA: Diagnosis not present

## 2020-04-20 DIAGNOSIS — M47896 Other spondylosis, lumbar region: Secondary | ICD-10-CM | POA: Diagnosis not present

## 2020-04-23 ENCOUNTER — Other Ambulatory Visit: Payer: Self-pay

## 2020-04-23 ENCOUNTER — Ambulatory Visit
Admission: RE | Admit: 2020-04-23 | Discharge: 2020-04-23 | Disposition: A | Discharge status: Home / Self Care | Payer: PPO | Source: Ambulatory Visit | Attending: Family Medicine | Admitting: Family Medicine

## 2020-04-23 DIAGNOSIS — Z1231 Encounter for screening mammogram for malignant neoplasm of breast: Secondary | ICD-10-CM | POA: Diagnosis not present

## 2020-04-24 DIAGNOSIS — F4322 Adjustment disorder with anxiety: Secondary | ICD-10-CM | POA: Diagnosis not present

## 2020-04-30 DIAGNOSIS — M47896 Other spondylosis, lumbar region: Secondary | ICD-10-CM | POA: Diagnosis not present

## 2020-05-01 DIAGNOSIS — M174 Other bilateral secondary osteoarthritis of knee: Secondary | ICD-10-CM | POA: Diagnosis not present

## 2020-05-01 DIAGNOSIS — M40204 Unspecified kyphosis, thoracic region: Secondary | ICD-10-CM | POA: Diagnosis not present

## 2020-05-01 DIAGNOSIS — G894 Chronic pain syndrome: Secondary | ICD-10-CM | POA: Diagnosis not present

## 2020-05-01 DIAGNOSIS — M47816 Spondylosis without myelopathy or radiculopathy, lumbar region: Secondary | ICD-10-CM | POA: Diagnosis not present

## 2020-05-01 DIAGNOSIS — Z79891 Long term (current) use of opiate analgesic: Secondary | ICD-10-CM | POA: Diagnosis not present

## 2020-05-08 DIAGNOSIS — I10 Essential (primary) hypertension: Secondary | ICD-10-CM | POA: Diagnosis not present

## 2020-05-08 DIAGNOSIS — F319 Bipolar disorder, unspecified: Secondary | ICD-10-CM | POA: Diagnosis not present

## 2020-05-08 DIAGNOSIS — M1712 Unilateral primary osteoarthritis, left knee: Secondary | ICD-10-CM | POA: Diagnosis not present

## 2020-05-08 DIAGNOSIS — K219 Gastro-esophageal reflux disease without esophagitis: Secondary | ICD-10-CM | POA: Diagnosis not present

## 2020-05-08 DIAGNOSIS — E785 Hyperlipidemia, unspecified: Secondary | ICD-10-CM | POA: Diagnosis not present

## 2020-05-25 DIAGNOSIS — F4322 Adjustment disorder with anxiety: Secondary | ICD-10-CM | POA: Diagnosis not present

## 2020-06-15 DIAGNOSIS — I10 Essential (primary) hypertension: Secondary | ICD-10-CM | POA: Diagnosis not present

## 2020-06-15 DIAGNOSIS — F319 Bipolar disorder, unspecified: Secondary | ICD-10-CM | POA: Diagnosis not present

## 2020-06-15 DIAGNOSIS — E785 Hyperlipidemia, unspecified: Secondary | ICD-10-CM | POA: Diagnosis not present

## 2020-06-15 DIAGNOSIS — M1712 Unilateral primary osteoarthritis, left knee: Secondary | ICD-10-CM | POA: Diagnosis not present

## 2020-06-15 DIAGNOSIS — K219 Gastro-esophageal reflux disease without esophagitis: Secondary | ICD-10-CM | POA: Diagnosis not present

## 2020-06-29 DIAGNOSIS — M47816 Spondylosis without myelopathy or radiculopathy, lumbar region: Secondary | ICD-10-CM | POA: Diagnosis not present

## 2020-06-29 DIAGNOSIS — M174 Other bilateral secondary osteoarthritis of knee: Secondary | ICD-10-CM | POA: Diagnosis not present

## 2020-06-29 DIAGNOSIS — G894 Chronic pain syndrome: Secondary | ICD-10-CM | POA: Diagnosis not present

## 2020-06-29 DIAGNOSIS — M40204 Unspecified kyphosis, thoracic region: Secondary | ICD-10-CM | POA: Diagnosis not present

## 2020-07-01 DIAGNOSIS — F410 Panic disorder [episodic paroxysmal anxiety] without agoraphobia: Secondary | ICD-10-CM | POA: Diagnosis not present

## 2020-07-01 DIAGNOSIS — F900 Attention-deficit hyperactivity disorder, predominantly inattentive type: Secondary | ICD-10-CM | POA: Diagnosis not present

## 2020-07-01 DIAGNOSIS — F341 Dysthymic disorder: Secondary | ICD-10-CM | POA: Diagnosis not present

## 2020-07-09 DIAGNOSIS — E785 Hyperlipidemia, unspecified: Secondary | ICD-10-CM | POA: Diagnosis not present

## 2020-07-09 DIAGNOSIS — I10 Essential (primary) hypertension: Secondary | ICD-10-CM | POA: Diagnosis not present

## 2020-07-09 DIAGNOSIS — Z1159 Encounter for screening for other viral diseases: Secondary | ICD-10-CM | POA: Diagnosis not present

## 2020-07-09 DIAGNOSIS — F319 Bipolar disorder, unspecified: Secondary | ICD-10-CM | POA: Diagnosis not present

## 2020-07-09 DIAGNOSIS — R7303 Prediabetes: Secondary | ICD-10-CM | POA: Diagnosis not present

## 2020-07-09 DIAGNOSIS — G894 Chronic pain syndrome: Secondary | ICD-10-CM | POA: Diagnosis not present

## 2020-07-09 DIAGNOSIS — Z79899 Other long term (current) drug therapy: Secondary | ICD-10-CM | POA: Diagnosis not present

## 2020-07-09 DIAGNOSIS — G4730 Sleep apnea, unspecified: Secondary | ICD-10-CM | POA: Diagnosis not present

## 2020-07-09 DIAGNOSIS — Z0000 Encounter for general adult medical examination without abnormal findings: Secondary | ICD-10-CM | POA: Diagnosis not present

## 2020-07-09 DIAGNOSIS — M5136 Other intervertebral disc degeneration, lumbar region: Secondary | ICD-10-CM | POA: Diagnosis not present

## 2020-07-09 DIAGNOSIS — K219 Gastro-esophageal reflux disease without esophagitis: Secondary | ICD-10-CM | POA: Diagnosis not present

## 2020-07-10 DIAGNOSIS — I10 Essential (primary) hypertension: Secondary | ICD-10-CM | POA: Diagnosis not present

## 2020-07-10 DIAGNOSIS — M1712 Unilateral primary osteoarthritis, left knee: Secondary | ICD-10-CM | POA: Diagnosis not present

## 2020-07-10 DIAGNOSIS — K219 Gastro-esophageal reflux disease without esophagitis: Secondary | ICD-10-CM | POA: Diagnosis not present

## 2020-07-10 DIAGNOSIS — E785 Hyperlipidemia, unspecified: Secondary | ICD-10-CM | POA: Diagnosis not present

## 2020-07-10 DIAGNOSIS — F319 Bipolar disorder, unspecified: Secondary | ICD-10-CM | POA: Diagnosis not present

## 2020-07-13 DIAGNOSIS — F4322 Adjustment disorder with anxiety: Secondary | ICD-10-CM | POA: Diagnosis not present

## 2020-07-21 DIAGNOSIS — G4733 Obstructive sleep apnea (adult) (pediatric): Secondary | ICD-10-CM | POA: Diagnosis not present

## 2020-08-03 DIAGNOSIS — G4733 Obstructive sleep apnea (adult) (pediatric): Secondary | ICD-10-CM | POA: Diagnosis not present

## 2020-08-06 DIAGNOSIS — R7303 Prediabetes: Secondary | ICD-10-CM | POA: Diagnosis not present

## 2020-08-06 DIAGNOSIS — I10 Essential (primary) hypertension: Secondary | ICD-10-CM | POA: Diagnosis not present

## 2020-08-06 DIAGNOSIS — Z1159 Encounter for screening for other viral diseases: Secondary | ICD-10-CM | POA: Diagnosis not present

## 2020-08-06 DIAGNOSIS — E785 Hyperlipidemia, unspecified: Secondary | ICD-10-CM | POA: Diagnosis not present

## 2020-08-06 DIAGNOSIS — Z79899 Other long term (current) drug therapy: Secondary | ICD-10-CM | POA: Diagnosis not present

## 2020-08-24 DIAGNOSIS — M174 Other bilateral secondary osteoarthritis of knee: Secondary | ICD-10-CM | POA: Diagnosis not present

## 2020-08-24 DIAGNOSIS — M47816 Spondylosis without myelopathy or radiculopathy, lumbar region: Secondary | ICD-10-CM | POA: Diagnosis not present

## 2020-08-24 DIAGNOSIS — G894 Chronic pain syndrome: Secondary | ICD-10-CM | POA: Diagnosis not present

## 2020-08-24 DIAGNOSIS — M40204 Unspecified kyphosis, thoracic region: Secondary | ICD-10-CM | POA: Diagnosis not present

## 2020-08-24 DIAGNOSIS — Z79891 Long term (current) use of opiate analgesic: Secondary | ICD-10-CM | POA: Diagnosis not present

## 2020-09-15 DIAGNOSIS — G894 Chronic pain syndrome: Secondary | ICD-10-CM | POA: Diagnosis not present

## 2020-09-15 DIAGNOSIS — M174 Other bilateral secondary osteoarthritis of knee: Secondary | ICD-10-CM | POA: Diagnosis not present

## 2020-09-15 DIAGNOSIS — M47816 Spondylosis without myelopathy or radiculopathy, lumbar region: Secondary | ICD-10-CM | POA: Diagnosis not present

## 2020-09-15 DIAGNOSIS — M40204 Unspecified kyphosis, thoracic region: Secondary | ICD-10-CM | POA: Diagnosis not present

## 2020-10-09 DIAGNOSIS — Z79899 Other long term (current) drug therapy: Secondary | ICD-10-CM | POA: Diagnosis not present

## 2020-10-09 DIAGNOSIS — E785 Hyperlipidemia, unspecified: Secondary | ICD-10-CM | POA: Diagnosis not present

## 2020-10-15 DIAGNOSIS — M1712 Unilateral primary osteoarthritis, left knee: Secondary | ICD-10-CM | POA: Diagnosis not present

## 2020-10-15 DIAGNOSIS — K219 Gastro-esophageal reflux disease without esophagitis: Secondary | ICD-10-CM | POA: Diagnosis not present

## 2020-10-15 DIAGNOSIS — E785 Hyperlipidemia, unspecified: Secondary | ICD-10-CM | POA: Diagnosis not present

## 2020-10-15 DIAGNOSIS — F319 Bipolar disorder, unspecified: Secondary | ICD-10-CM | POA: Diagnosis not present

## 2020-10-15 DIAGNOSIS — I10 Essential (primary) hypertension: Secondary | ICD-10-CM | POA: Diagnosis not present

## 2020-10-16 DIAGNOSIS — I10 Essential (primary) hypertension: Secondary | ICD-10-CM | POA: Diagnosis not present

## 2020-11-06 DIAGNOSIS — M5136 Other intervertebral disc degeneration, lumbar region: Secondary | ICD-10-CM | POA: Diagnosis not present

## 2020-11-06 DIAGNOSIS — I10 Essential (primary) hypertension: Secondary | ICD-10-CM | POA: Diagnosis not present

## 2020-11-06 DIAGNOSIS — E785 Hyperlipidemia, unspecified: Secondary | ICD-10-CM | POA: Diagnosis not present

## 2020-11-06 DIAGNOSIS — G894 Chronic pain syndrome: Secondary | ICD-10-CM | POA: Diagnosis not present

## 2020-11-06 DIAGNOSIS — F319 Bipolar disorder, unspecified: Secondary | ICD-10-CM | POA: Diagnosis not present

## 2020-11-10 DIAGNOSIS — M40204 Unspecified kyphosis, thoracic region: Secondary | ICD-10-CM | POA: Diagnosis not present

## 2020-11-10 DIAGNOSIS — M174 Other bilateral secondary osteoarthritis of knee: Secondary | ICD-10-CM | POA: Diagnosis not present

## 2020-11-10 DIAGNOSIS — G894 Chronic pain syndrome: Secondary | ICD-10-CM | POA: Diagnosis not present

## 2020-11-10 DIAGNOSIS — M47816 Spondylosis without myelopathy or radiculopathy, lumbar region: Secondary | ICD-10-CM | POA: Diagnosis not present

## 2020-11-17 DIAGNOSIS — I10 Essential (primary) hypertension: Secondary | ICD-10-CM | POA: Diagnosis not present

## 2020-11-27 DIAGNOSIS — F319 Bipolar disorder, unspecified: Secondary | ICD-10-CM | POA: Diagnosis not present

## 2020-11-27 DIAGNOSIS — K219 Gastro-esophageal reflux disease without esophagitis: Secondary | ICD-10-CM | POA: Diagnosis not present

## 2020-11-27 DIAGNOSIS — E785 Hyperlipidemia, unspecified: Secondary | ICD-10-CM | POA: Diagnosis not present

## 2020-11-27 DIAGNOSIS — M1712 Unilateral primary osteoarthritis, left knee: Secondary | ICD-10-CM | POA: Diagnosis not present

## 2020-11-27 DIAGNOSIS — I10 Essential (primary) hypertension: Secondary | ICD-10-CM | POA: Diagnosis not present

## 2020-12-11 DIAGNOSIS — K219 Gastro-esophageal reflux disease without esophagitis: Secondary | ICD-10-CM | POA: Diagnosis not present

## 2020-12-11 DIAGNOSIS — R1013 Epigastric pain: Secondary | ICD-10-CM | POA: Diagnosis not present

## 2020-12-17 DIAGNOSIS — I10 Essential (primary) hypertension: Secondary | ICD-10-CM | POA: Diagnosis not present

## 2020-12-22 DIAGNOSIS — F331 Major depressive disorder, recurrent, moderate: Secondary | ICD-10-CM | POA: Diagnosis not present

## 2020-12-30 DIAGNOSIS — R1013 Epigastric pain: Secondary | ICD-10-CM | POA: Diagnosis not present

## 2021-01-05 DIAGNOSIS — G894 Chronic pain syndrome: Secondary | ICD-10-CM | POA: Diagnosis not present

## 2021-01-05 DIAGNOSIS — M40204 Unspecified kyphosis, thoracic region: Secondary | ICD-10-CM | POA: Diagnosis not present

## 2021-01-05 DIAGNOSIS — Z79891 Long term (current) use of opiate analgesic: Secondary | ICD-10-CM | POA: Diagnosis not present

## 2021-01-05 DIAGNOSIS — M174 Other bilateral secondary osteoarthritis of knee: Secondary | ICD-10-CM | POA: Diagnosis not present

## 2021-01-05 DIAGNOSIS — M47816 Spondylosis without myelopathy or radiculopathy, lumbar region: Secondary | ICD-10-CM | POA: Diagnosis not present

## 2021-01-14 DIAGNOSIS — M1712 Unilateral primary osteoarthritis, left knee: Secondary | ICD-10-CM | POA: Diagnosis not present

## 2021-01-14 DIAGNOSIS — I10 Essential (primary) hypertension: Secondary | ICD-10-CM | POA: Diagnosis not present

## 2021-01-14 DIAGNOSIS — E785 Hyperlipidemia, unspecified: Secondary | ICD-10-CM | POA: Diagnosis not present

## 2021-01-14 DIAGNOSIS — K219 Gastro-esophageal reflux disease without esophagitis: Secondary | ICD-10-CM | POA: Diagnosis not present

## 2021-01-14 DIAGNOSIS — F319 Bipolar disorder, unspecified: Secondary | ICD-10-CM | POA: Diagnosis not present

## 2021-01-15 DIAGNOSIS — I10 Essential (primary) hypertension: Secondary | ICD-10-CM | POA: Diagnosis not present

## 2021-02-04 ENCOUNTER — Ambulatory Visit: Payer: PPO | Admitting: Family Medicine

## 2021-02-11 DIAGNOSIS — E785 Hyperlipidemia, unspecified: Secondary | ICD-10-CM | POA: Diagnosis not present

## 2021-02-11 DIAGNOSIS — F319 Bipolar disorder, unspecified: Secondary | ICD-10-CM | POA: Diagnosis not present

## 2021-02-11 DIAGNOSIS — K219 Gastro-esophageal reflux disease without esophagitis: Secondary | ICD-10-CM | POA: Diagnosis not present

## 2021-02-11 DIAGNOSIS — I10 Essential (primary) hypertension: Secondary | ICD-10-CM | POA: Diagnosis not present

## 2021-02-11 DIAGNOSIS — M1712 Unilateral primary osteoarthritis, left knee: Secondary | ICD-10-CM | POA: Diagnosis not present

## 2021-02-17 DIAGNOSIS — I10 Essential (primary) hypertension: Secondary | ICD-10-CM | POA: Diagnosis not present

## 2021-02-25 DIAGNOSIS — K317 Polyp of stomach and duodenum: Secondary | ICD-10-CM | POA: Diagnosis not present

## 2021-02-25 DIAGNOSIS — R1013 Epigastric pain: Secondary | ICD-10-CM | POA: Diagnosis not present

## 2021-02-25 DIAGNOSIS — K3189 Other diseases of stomach and duodenum: Secondary | ICD-10-CM | POA: Diagnosis not present

## 2021-02-25 DIAGNOSIS — R6881 Early satiety: Secondary | ICD-10-CM | POA: Diagnosis not present

## 2021-02-25 DIAGNOSIS — K2930 Chronic superficial gastritis without bleeding: Secondary | ICD-10-CM | POA: Diagnosis not present

## 2021-02-25 DIAGNOSIS — R634 Abnormal weight loss: Secondary | ICD-10-CM | POA: Diagnosis not present

## 2021-03-02 DIAGNOSIS — M40204 Unspecified kyphosis, thoracic region: Secondary | ICD-10-CM | POA: Diagnosis not present

## 2021-03-02 DIAGNOSIS — M174 Other bilateral secondary osteoarthritis of knee: Secondary | ICD-10-CM | POA: Diagnosis not present

## 2021-03-02 DIAGNOSIS — M47816 Spondylosis without myelopathy or radiculopathy, lumbar region: Secondary | ICD-10-CM | POA: Diagnosis not present

## 2021-03-02 DIAGNOSIS — K2930 Chronic superficial gastritis without bleeding: Secondary | ICD-10-CM | POA: Diagnosis not present

## 2021-03-02 DIAGNOSIS — G894 Chronic pain syndrome: Secondary | ICD-10-CM | POA: Diagnosis not present

## 2021-03-08 DIAGNOSIS — E785 Hyperlipidemia, unspecified: Secondary | ICD-10-CM | POA: Diagnosis not present

## 2021-03-08 DIAGNOSIS — K219 Gastro-esophageal reflux disease without esophagitis: Secondary | ICD-10-CM | POA: Diagnosis not present

## 2021-03-08 DIAGNOSIS — I10 Essential (primary) hypertension: Secondary | ICD-10-CM | POA: Diagnosis not present

## 2021-03-08 DIAGNOSIS — F319 Bipolar disorder, unspecified: Secondary | ICD-10-CM | POA: Diagnosis not present

## 2021-03-08 DIAGNOSIS — Z23 Encounter for immunization: Secondary | ICD-10-CM | POA: Diagnosis not present

## 2021-03-08 DIAGNOSIS — R7303 Prediabetes: Secondary | ICD-10-CM | POA: Diagnosis not present

## 2021-03-08 DIAGNOSIS — M1712 Unilateral primary osteoarthritis, left knee: Secondary | ICD-10-CM | POA: Diagnosis not present

## 2021-03-08 DIAGNOSIS — R1013 Epigastric pain: Secondary | ICD-10-CM | POA: Diagnosis not present

## 2021-03-10 ENCOUNTER — Other Ambulatory Visit: Payer: Self-pay | Admitting: Family Medicine

## 2021-03-10 DIAGNOSIS — R1013 Epigastric pain: Secondary | ICD-10-CM

## 2021-03-12 ENCOUNTER — Ambulatory Visit: Payer: PPO | Admitting: Dietician

## 2021-03-24 ENCOUNTER — Other Ambulatory Visit: Payer: Self-pay | Admitting: Gastroenterology

## 2021-03-24 ENCOUNTER — Other Ambulatory Visit (HOSPITAL_COMMUNITY): Payer: Self-pay | Admitting: Gastroenterology

## 2021-03-24 DIAGNOSIS — R1013 Epigastric pain: Secondary | ICD-10-CM

## 2021-03-29 ENCOUNTER — Ambulatory Visit
Admission: RE | Admit: 2021-03-29 | Discharge: 2021-03-29 | Disposition: A | Discharge status: Home / Self Care | Payer: PPO | Source: Ambulatory Visit | Attending: Family Medicine | Admitting: Family Medicine

## 2021-03-29 DIAGNOSIS — R634 Abnormal weight loss: Secondary | ICD-10-CM | POA: Diagnosis not present

## 2021-03-29 DIAGNOSIS — K5711 Diverticulosis of small intestine without perforation or abscess with bleeding: Secondary | ICD-10-CM | POA: Diagnosis not present

## 2021-03-29 DIAGNOSIS — R109 Unspecified abdominal pain: Secondary | ICD-10-CM | POA: Diagnosis not present

## 2021-03-29 DIAGNOSIS — R1013 Epigastric pain: Secondary | ICD-10-CM

## 2021-03-29 MED ORDER — IOPAMIDOL (ISOVUE-300) INJECTION 61%
100.0000 mL | Freq: Once | INTRAVENOUS | Status: AC | PRN
  Administered 2021-03-29: 100 mL via INTRAVENOUS

## 2021-04-13 ENCOUNTER — Encounter (HOSPITAL_COMMUNITY)
Admission: RE | Admit: 2021-04-13 | Discharge: 2021-04-13 | Disposition: A | Discharge status: Home / Self Care | Payer: PPO | Source: Ambulatory Visit | Attending: Gastroenterology | Admitting: Gastroenterology

## 2021-04-13 ENCOUNTER — Other Ambulatory Visit: Payer: Self-pay

## 2021-04-13 DIAGNOSIS — R1013 Epigastric pain: Secondary | ICD-10-CM | POA: Insufficient documentation

## 2021-04-13 MED ORDER — TECHNETIUM TC 99M SULFUR COLLOID
2.1000 | Freq: Once | INTRAVENOUS | Status: AC | PRN
  Administered 2021-04-13: 2.1 via ORAL

## 2021-04-16 DIAGNOSIS — I10 Essential (primary) hypertension: Secondary | ICD-10-CM | POA: Diagnosis not present

## 2021-04-27 DIAGNOSIS — M47816 Spondylosis without myelopathy or radiculopathy, lumbar region: Secondary | ICD-10-CM | POA: Diagnosis not present

## 2021-04-27 DIAGNOSIS — G894 Chronic pain syndrome: Secondary | ICD-10-CM | POA: Diagnosis not present

## 2021-04-27 DIAGNOSIS — M40204 Unspecified kyphosis, thoracic region: Secondary | ICD-10-CM | POA: Diagnosis not present

## 2021-04-27 DIAGNOSIS — M174 Other bilateral secondary osteoarthritis of knee: Secondary | ICD-10-CM | POA: Diagnosis not present

## 2021-05-03 ENCOUNTER — Ambulatory Visit: Payer: PPO | Admitting: Dietician

## 2021-05-19 DIAGNOSIS — I10 Essential (primary) hypertension: Secondary | ICD-10-CM | POA: Diagnosis not present

## 2021-05-27 DIAGNOSIS — E440 Moderate protein-calorie malnutrition: Secondary | ICD-10-CM | POA: Diagnosis not present

## 2021-06-01 DIAGNOSIS — R634 Abnormal weight loss: Secondary | ICD-10-CM | POA: Diagnosis not present

## 2021-06-01 DIAGNOSIS — E440 Moderate protein-calorie malnutrition: Secondary | ICD-10-CM | POA: Diagnosis not present

## 2021-06-01 DIAGNOSIS — R1013 Epigastric pain: Secondary | ICD-10-CM | POA: Diagnosis not present

## 2021-06-01 DIAGNOSIS — R21 Rash and other nonspecific skin eruption: Secondary | ICD-10-CM | POA: Diagnosis not present

## 2021-06-01 DIAGNOSIS — H6191 Disorder of right external ear, unspecified: Secondary | ICD-10-CM | POA: Diagnosis not present

## 2021-06-01 DIAGNOSIS — E785 Hyperlipidemia, unspecified: Secondary | ICD-10-CM | POA: Diagnosis not present

## 2021-06-16 DIAGNOSIS — K219 Gastro-esophageal reflux disease without esophagitis: Secondary | ICD-10-CM | POA: Diagnosis not present

## 2021-06-16 DIAGNOSIS — M1712 Unilateral primary osteoarthritis, left knee: Secondary | ICD-10-CM | POA: Diagnosis not present

## 2021-06-16 DIAGNOSIS — E785 Hyperlipidemia, unspecified: Secondary | ICD-10-CM | POA: Diagnosis not present

## 2021-06-16 DIAGNOSIS — F319 Bipolar disorder, unspecified: Secondary | ICD-10-CM | POA: Diagnosis not present

## 2021-06-16 DIAGNOSIS — I10 Essential (primary) hypertension: Secondary | ICD-10-CM | POA: Diagnosis not present

## 2021-06-17 ENCOUNTER — Other Ambulatory Visit: Payer: Self-pay

## 2021-06-17 ENCOUNTER — Encounter: Payer: Self-pay | Admitting: Allergy & Immunology

## 2021-06-17 ENCOUNTER — Ambulatory Visit (INDEPENDENT_AMBULATORY_CARE_PROVIDER_SITE_OTHER): Payer: PPO | Admitting: Allergy & Immunology

## 2021-06-17 VITALS — BP 130/90 | HR 80 | Temp 97.7°F | Resp 16 | Ht 66.0 in | Wt 174.8 lb

## 2021-06-17 DIAGNOSIS — R899 Unspecified abnormal finding in specimens from other organs, systems and tissues: Secondary | ICD-10-CM

## 2021-06-17 DIAGNOSIS — R109 Unspecified abdominal pain: Secondary | ICD-10-CM | POA: Diagnosis not present

## 2021-06-17 DIAGNOSIS — Z0182 Encounter for allergy testing: Secondary | ICD-10-CM | POA: Diagnosis not present

## 2021-06-17 DIAGNOSIS — G8929 Other chronic pain: Secondary | ICD-10-CM

## 2021-06-17 NOTE — Patient Instructions (Addendum)
1. Chronic abdominal pain - Testing to the entire panel was negative. - There is a the low positive predictive value of food allergy testing and hence the high possibility of false positives. - In contrast, food allergy testing has a high negative predictive value, therefore if testing is negative we can be relatively assured that they are indeed negative.  - Copy of testing results provided. 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 - It was such a low number and nearly negative. - You could try avoiding milk, although I only recommend that for patients with very elevated IgE levels to milk. - Also make sure that you are avoiding gelatin, which is a mammalian by product. - I will keep thinking about other ideas outside of the box, but one thing is radiation exposure (think radon).   2. Return if symptoms worsen or fail to improve.    Please inform us of any Emergency Department visits, hospitalizations, or changes in symptoms. Call us before going to the ED for breathing or allergy symptoms since we might be able to fit you in for a sick visit. Feel free to contact us anytime with any questions, problems, or concerns.  It was a pleasure to meet you and your family today!  Websites that have reliable patient information: 1. American Academy of Asthma, Allergy, and Immunology: www.aaaai.org 2. Food Allergy Research and Education (FARE): foodallergy.org 3. Mothers of Asthmatics: http://www.asthmacommunitynetwork.org 4. American College of Allergy, Asthma, and Immunology: www.acaai.org   COVID-19 Vaccine Information can be found at: [email or web address].uk For questions related to vaccine distribution or appointments, please email [email or web address] or call [phone].   We realize that you might be concerned about having an allergic reaction to the COVID19 vaccines. To help with that  concern, WE ARE OFFERING THE COVID19 VACCINES IN OUR OFFICE! Ask the front desk for dates!     Like [place] on [employer] and Instagram for our latest updates!      A healthy democracy works best when [employer] participate! Make sure you are registered to vote! If you have moved or changed any of your contact information, you will need to get this updated before voting!  In some cases, you MAY be able to register to vote online: [email or web address]       Food Adult Perc - 06/17/21 1600     Location Back    Number of allergen test 72     Control-buffer 50% Glycerol Negative    Control-Histamine 1 mg/ml 3+   STRONG HISTAMINE   1. Peanut Negative    2. Soybean Negative    3. Wheat Negative    4. Sesame Negative    5. Milk, cow Negative    6. Egg White, Chicken Negative    7. Casein Negative    8. Shellfish Mix Negative    9. Fish Mix Negative    10. Cashew Negative    11. Pecan Food Negative    12. [place] Negative    13. Almond Negative    14. Hazelnut Negative    15. [place] nut Negative    16. Coconut Negative    17. Pistachio Negative    18. Catfish Negative    19. Bass Negative    20. Trout Negative    21. Tuna Negative    22. Salmon Negative    23. Flounder Negative    24. Codfish Negative    25. Shrimp Negative  26. Crab Negative    27. Lobster Negative    28. Oyster Negative    29. Scallops Negative    30. Barley Negative    31. Oat  Negative    32. Rye  Negative    33. Hops Negative    34. Rice Negative    35. Cottonseed Negative    36. Saccharomyces Cerevisiae  Negative    37. Pork Negative    38. [place] Meat Negative    39. Chicken Meat Negative    40. Beef Negative    41. Lamb Negative    42. Tomato Negative    43. White Potato Negative    44. Sweet Potato Negative    45. Pea, Green/English Negative    46. Navy Bean Negative    47. Mushrooms Negative    48. Avocado Negative    49. Onion Negative    50.  Cabbage Negative    51. Carrots Negative    52. Celery Negative    53. Corn Negative    54. Cucumber Negative    55. Grape (White seedless) Negative    56. Orange  Negative    57. Banana Negative    58. Apple Negative    59. Peach Negative    60. Strawberry Negative    61. Cantaloupe Negative    62. Watermelon Negative    63. Pineapple Negative    64. Chocolate/Cacao bean Negative    65. Karaya Gum Negative    66. Acacia (Arabic Gum) Negative    67. Cinnamon Negative    68. Nutmeg Negative    69. Ginger Negative    70. Garlic Negative    71. Pepper, black Negative    72. Mustard Negative

## 2021-06-17 NOTE — Progress Notes (Signed)
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  She has no symptoms of itching, hives, throat swelling, or histamine release at all.  Mast cell disease was something my came to mind, but again, aside from the abdominal pain, there is no other sign of histamine release.  One would expect more diarrhea as well as the accompanying urticaria, wheezing, etc.  When she has labs drawn again, we could consider adding on a tryptase which is an excellent screen.  Regarding her IgE to beef, I am not sure this is relevant given its very low value.  She has been avoiding red meat without any apparent improvement in her symptoms.  She is not avoiding dairy, but does not eat much of it anyway.  Typically we only encourage patients with very high IgE's to alpha gal to avoid mammalian products such as cows milk.  So I do not think avoiding milk is going to add much in this case.  Plan/Recommendations:   1. Chronic abdominal pain - Testing to the entire panel was negative. - There is a the low positive predictive value of food allergy testing and hence the high possibility of false positives. - In contrast, food allergy testing has a high negative predictive value, therefore if testing is negative we can be relatively assured that they are indeed negative.  - Copy of testing results provided. 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 - It was such a low number and nearly negative. - You could try avoiding milk, although I only recommend that for patients with very elevated IgE levels to milk. - Also make sure that you are avoiding gelatin, which  is a mammalian by product. - I will keep thinking about other ideas outside of the box, but one thing is radiation exposure (think radon).   2. Return if symptoms worsen or fail to improve.    This note in its entirety was forwarded to the Provider who requested this consultation.  Subjective:   Shelley Vasquez is a 55 y.o. female presenting today for evaluation of  Chief Complaint  Patient presents with   alpha gal    Elevated lab results - beef. Patient states she has lost 40lbs since June.    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  Shelley Vasquez was referred by Harlan Stains, MD.     Shelley Vasquez is a 54 y.o. female presenting for an evaluation of possible food allergies .  She has lost 40 pounds untentionally over 6 months. She has had a "million" tests. She had an endoscopy.Gadtris emptying was normal. She sees Dr. Roena Malady for GI, but she only met her once before the endoscopy. She has been followed by the PA. The next appointment with her is in January.   She never had a huge appetite, but these last 6  months to good to a different level.  She reports that she has cramps as well as stabbing pains with as little as half a cup of food.  Her big question today is whether this is a food allergy.  She says this is a last ditch effort to figure out what is going on.  She has been taking Zofran with some improvement.  She has been on pantoprazole up to twice a day.  She has seen a Firefighter.  The registered dietitian, Shelley Vasquez, recommended some changes to her diet, including smaller more nutrient dense meals more frequently.  There is no diarrhea with this. GERD has become resolved, although she had this in the  past.  She has had a work-up by Dr. Harlan Stains, her primary care provider.  She has a lot of labs performed.  One of those labs was an alpha gal panel which showed an IgE to be of 0.12, but the remainder of the alpha gal panel was negative.  Avoidance of beef was recommended.  A complete blood count was largely normal.  A metabolic panel was largely normal as well aside from a CO2 of 35.  Thyroid studies were normal.  Vitamin D was normal.  Vitamin B12 was normal.  She eats less than half a cup of food. She can eat potatoes, hummus, sweet potatoes, canned green beans, canned carrots. She can eat Ramen noodles and pudding. She cannot eat raw foods at all. She can eat a boiled egg.  She is on disability due to back issues and some mental issues. 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 She has been on disability since 2002 or so. She previously worked as an Chief of Staff.   Otherwise, there is no history of other atopic diseases, including asthma, drug allergies, environmental allergies, stinging insect allergies, eczema, urticaria, or contact dermatitis. There is no significant infectious history. Vaccinations are up to date.    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   OBSTRUCTIVE SLEEP APNEA 07/27/2007    Medication List:  Allergies as of 06/17/2021       Reactions   Nsaids Other (See Comments)   Bleeding ulcer   Prednisone Other (See Comments)   Suicidal   Prozac [fluoxetine Hcl] Anxiety, Hypertension   Amoxicillin-pot Clavulanate Nausea And Vomiting   Anesthesia S-i-40 [propofol] Other (See Comments)   Lithium Other (See Comments)   Lithium poisoning    Wellbutrin [bupropion]    UNSPECIFIED REACTION    Dilaudid [hydromorphone Hcl] Other (See Comments)   Agitation   Escitalopram  Oxalate Other (See Comments)   Shaking   Zolpidem Tartrate Other (See Comments)   Stay up        Medication List        Accurate as of June 17, 2021 11:59 PM. If you have any questions, ask your nurse or doctor.          STOP taking these medications    celecoxib 200 MG capsule Commonly known as: CELEBREX Stopped by: Valentina Shaggy, MD   Trintellix 5 MG Tabs tablet Generic drug: vortioxetine HBr Stopped by: Valentina Shaggy, MD       TAKE these medications    betamethasone valerate 0.1 % cream Commonly known as: VALISONE Apply 1 application topically See admin instructions. Up to twice  a week as needed for vaginal dryness.   clonazePAM 1 MG tablet Commonly known as: KLONOPIN Take 1 mg by mouth See admin instructions. Take 1 tablet (1 mg) by mouth midday if needed for anxiety & take up 2 tablets (2 mg) by mouth at bedtime as needed for sleep.   docusate sodium 100 MG capsule Commonly known as: COLACE Take 400 mg by mouth 2 (two) times daily.   fentaNYL 25 MCG/HR Commonly known as: DURAGESIC Place 25 mcg onto the skin every other day.   guanFACINE 4 MG Tb24 ER tablet Commonly known as: INTUNIV Take 4 mg by mouth daily.   hyoscyamine 0.125 MG SL tablet Commonly known as: LEVSIN SL Place 0.125 mg under the tongue every 4 (four) hours as needed for cramping (difficulty swallowing.).   lamoTRIgine 200 MG tablet Commonly known as: LAMICTAL Take 400 mg by mouth at bedtime.   Magnesium 250 MG Tabs Take 250-500 mg by mouth 2 (two) times daily as needed (constipation).   MAGNESIUM CITRATE PO Take 1 spray by mouth 3 (three) times daily as needed (for muscle cramps).   ondansetron 8 MG disintegrating tablet Commonly known as: ZOFRAN-ODT Take 8 mg by mouth every 8 (eight) hours as needed for nausea or vomiting.   OVER THE COUNTER MEDICATION Take 0.5 tablets by mouth 2 (two) times daily. Avocado SoyBean   oxyCODONE-acetaminophen 5-325 MG  tablet Commonly known as: PERCOCET/ROXICET Take 1 tablet by mouth 4 (four) times daily.   pantoprazole 40 MG tablet Commonly known as: PROTONIX Take 40 mg by mouth 2 (two) times daily.   rizatriptan 10 MG tablet Commonly known as: MAXALT Take 10 mg by mouth as needed for migraine. May repeat in 2 hours if needed   tiZANidine 4 MG tablet Commonly known as: ZANAFLEX Take 4-8 mg by mouth See admin instructions. Take 1 tablet (4 mg) by mouth in the morning & take 3 tablets by mouth at night.   traZODone 50 MG tablet Commonly known as: DESYREL Take 100 mg by mouth at bedtime.   triamcinolone cream 0.5 % Commonly known as: KENALOG Apply 1 application topically 2 (two) times daily as needed (for dry/cracking skin on hands.).   triamterene-hydrochlorothiazide 37.5-25 MG tablet Commonly known as: MAXZIDE-25 Take 0.5 tablets by mouth daily.   Viibryd 20 MG Tabs Generic drug: Vilazodone HCl Take 1 tablet by mouth daily.        Birth History: non-contributory  Developmental History: non-contributory  Past Surgical History: Past Surgical History:  Procedure Laterality Date   ABDOMINAL HYSTERECTOMY     10/2010   ANTERIOR LAT LUMBAR FUSION Left 02/12/2018   Procedure: Lumbar Two - Three Lumbar Three - Four Anterolateral lumbar decompression/fusion with lateral plate;  Surgeon: Kristeen Miss, MD;  Location: [facility];  Service: Neurosurgery;  Laterality: Left;  Lumbar Two - Three Lumbar Three - Four Anterolateral lumbar decompression/fusion with lateral plate   BACK SURGERY     2011 lumbar 4-5 fusion   BIOPSY  06/26/2018   Procedure: BIOPSY;  Surgeon: Ronnette Juniper, MD;  Location: [facility];  Service: Gastroenterology;;  EGD and Colon   BREAST SURGERY  01/2010   breast reduction   CHOLECYSTECTOMY N/A 05/08/2019   Procedure: LAPAROSCOPIC CHOLECYSTECTOMY WITH INTRAOPERATIVE CHOLANGIOGRAM;  Surgeon: Coralie Keens, MD;  Location: [facility];  Service: General;  Laterality: N/A;    COLONOSCOPY N/A 06/26/2018   Procedure: COLONOSCOPY;  Surgeon: Ronnette Juniper, MD;  Location: [facility];  Service: Gastroenterology;  Laterality: N/A;   ESOPHAGOGASTRODUODENOSCOPY  N/A 06/26/2018   Procedure: ESOPHAGOGASTRODUODENOSCOPY (EGD);  Surgeon: Ronnette Juniper, MD;  Location: [facility];  Service: Gastroenterology;  Laterality: N/A;   left knee arthroscopic Bilateral    2016 bilateral   REDUCTION MAMMAPLASTY Bilateral    TOE SURGERY       Family History: History reviewed. No pertinent family history.   Social History: Shelley Vasquez lives at home with her husband.  He is on disability.  She is not a smoker.  There is no pet exposure.   Review of Systems  Constitutional: Negative.  Negative for chills, fever, malaise/fatigue and weight loss.  HENT: Negative.  Negative for congestion, ear discharge, ear pain and sinus pain.   Eyes:  Negative for pain, discharge and redness.  Respiratory:  Negative for cough, sputum production, shortness of breath, wheezing and stridor.   Cardiovascular: Negative.  Negative for chest pain and palpitations.  Gastrointestinal:  Positive for abdominal pain, heartburn and nausea. Negative for constipation, diarrhea and vomiting.  Skin: Negative.  Negative for itching and rash.  Neurological:  Negative for dizziness and headaches.  Endo/Heme/Allergies:  Negative for environmental allergies. Does not bruise/bleed easily.      Objective:   Blood pressure 130/90, pulse 80, temperature 97.7 F (36.5 C), resp. rate 16, height 5' 6"  (1.676 m), weight 174 lb 12.8 oz (79.3 kg), last menstrual period 10/27/2010, SpO2 97 %. Body mass index is 28.21 kg/m.   Physical Exam:   Physical Exam Vitals reviewed.  Constitutional:      Appearance: She is well-developed.     Comments: Somewhat tearful at times.  HENT:     Head: Normocephalic and atraumatic.     Right Ear: Tympanic membrane, ear canal and external ear normal. No drainage, swelling or tenderness. Tympanic  membrane is not injected, scarred, erythematous, retracted or bulging.     Left Ear: Tympanic membrane, ear canal and external ear normal. No drainage, swelling or tenderness. Tympanic membrane is not injected, scarred, erythematous, retracted or bulging.     Nose: No nasal deformity, septal deviation, mucosal edema or rhinorrhea.     Right Turbinates: Enlarged, swollen and pale.     Left Turbinates: Enlarged, swollen and pale.     Right Sinus: No maxillary sinus tenderness or frontal sinus tenderness.     Left Sinus: No maxillary sinus tenderness or frontal sinus tenderness.     Mouth/Throat:     Mouth: Mucous membranes are not pale and not dry.     Pharynx: Uvula midline.  Eyes:     General:        Right eye: No discharge.        Left eye: No discharge.     Conjunctiva/sclera: Conjunctivae normal.     Right eye: Right conjunctiva is not injected. No chemosis.    Left eye: Left conjunctiva is not injected. No chemosis.    Pupils: Pupils are equal, round, and reactive to light.  Cardiovascular:     Rate and Rhythm: Normal rate and regular rhythm.     Heart sounds: Normal heart sounds.  Pulmonary:     Effort: Pulmonary effort is normal. No tachypnea, accessory muscle usage or respiratory distress.     Breath sounds: Normal breath sounds. No wheezing, rhonchi or rales.     Comments: Moving air well in all lung fields.  No increased work of breathing. Chest:     Chest wall: No tenderness.  Abdominal:     Tenderness: There is no abdominal tenderness. There is no guarding or  rebound.  Lymphadenopathy:     Head:     Right side of head: No submandibular, tonsillar or occipital adenopathy.     Left side of head: No submandibular, tonsillar or occipital adenopathy.     Cervical: No cervical adenopathy.  Skin:    Coloration: Skin is not pale.     Findings: No abrasion, erythema, petechiae or rash. Rash is not papular, urticarial or vesicular.  Neurological:     Mental Status: She is  alert.  Psychiatric:        Behavior: Behavior is cooperative.     Diagnostic studies:   Allergy Studies:   Back    Number of allergen test 72    Control-buffer 50% Glycerol Negative   Control-Histamine 1 mg/ml 3+   STRONG HISTAMINE  1. Peanut Negative   2. Soybean Negative   3. Wheat Negative   4. Sesame Negative   5. Milk, cow Negative   6. Egg White, Chicken Negative   7. Casein Negative   8. Shellfish Mix Negative   9. Fish Mix Negative   10. Cashew Negative   11. Pecan Food Negative   12. [place] Negative   13. Almond Negative   14. Hazelnut Negative   15. [place] nut Negative   16. Coconut Negative   17. Pistachio Negative   18. Catfish Negative   19. Bass Negative   20. Trout Negative   21. Tuna Negative   22. Salmon Negative   23. Flounder Negative   24. Codfish Negative   25. Shrimp Negative   26. Crab Negative   27. Lobster Negative   28. Oyster Negative   29. Scallops Negative   30. Barley Negative   31. Oat  Negative   32. Rye  Negative   33. Hops Negative   34. Rice Negative   35. Cottonseed Negative   36. Saccharomyces Cerevisiae  Negative   37. Pork Negative   38. [place] Meat Negative   39. Chicken Meat Negative   40. Beef Negative   41. Lamb Negative   42. Tomato Negative   43. White Potato Negative   44. Sweet Potato Negative   45. Pea, Green/English Negative   46. [place] Bean Negative   47. Mushrooms Negative   48. Avocado Negative   49. Onion Negative   50. Cabbage Negative   51. Carrots Negative   52. Celery Negative   53. Corn Negative   54. Cucumber Negative   55. Grape (White seedless) Negative   56. Orange  Negative   57. Banana Negative   58. Apple Negative   59. Peach Negative   60. Strawberry Negative   61. Cantaloupe Negative   62. Watermelon Negative   63. Pineapple Negative   64. Chocolate/Cacao bean Negative   65. Karaya Gum Negative   66. Acacia (Arabic Gum) Negative   67. Cinnamon Negative   68. Nutmeg  Negative   69. Ginger Negative   70. Garlic Negative   71. Pepper, black Negative   72. Mustard Negative          Salvatore Marvel, MD [facility] [place]

## 2021-06-18 DIAGNOSIS — I10 Essential (primary) hypertension: Secondary | ICD-10-CM | POA: Diagnosis not present

## 2021-06-22 DIAGNOSIS — G894 Chronic pain syndrome: Secondary | ICD-10-CM | POA: Diagnosis not present

## 2021-06-22 DIAGNOSIS — M47816 Spondylosis without myelopathy or radiculopathy, lumbar region: Secondary | ICD-10-CM | POA: Diagnosis not present

## 2021-06-22 DIAGNOSIS — M40204 Unspecified kyphosis, thoracic region: Secondary | ICD-10-CM | POA: Diagnosis not present

## 2021-06-22 DIAGNOSIS — M174 Other bilateral secondary osteoarthritis of knee: Secondary | ICD-10-CM | POA: Diagnosis not present

## 2021-06-25 ENCOUNTER — Other Ambulatory Visit: Payer: Self-pay | Admitting: Family Medicine

## 2021-06-25 DIAGNOSIS — Z1231 Encounter for screening mammogram for malignant neoplasm of breast: Secondary | ICD-10-CM

## 2021-06-29 DIAGNOSIS — E440 Moderate protein-calorie malnutrition: Secondary | ICD-10-CM | POA: Diagnosis not present

## 2021-06-30 DIAGNOSIS — F3342 Major depressive disorder, recurrent, in full remission: Secondary | ICD-10-CM | POA: Diagnosis not present

## 2021-06-30 DIAGNOSIS — F900 Attention-deficit hyperactivity disorder, predominantly inattentive type: Secondary | ICD-10-CM | POA: Diagnosis not present

## 2021-06-30 DIAGNOSIS — F411 Generalized anxiety disorder: Secondary | ICD-10-CM | POA: Diagnosis not present

## 2021-06-30 DIAGNOSIS — G4700 Insomnia, unspecified: Secondary | ICD-10-CM | POA: Diagnosis not present

## 2021-07-09 DIAGNOSIS — R1013 Epigastric pain: Secondary | ICD-10-CM | POA: Diagnosis not present

## 2021-07-12 DIAGNOSIS — K219 Gastro-esophageal reflux disease without esophagitis: Secondary | ICD-10-CM | POA: Diagnosis not present

## 2021-07-12 DIAGNOSIS — E785 Hyperlipidemia, unspecified: Secondary | ICD-10-CM | POA: Diagnosis not present

## 2021-07-12 DIAGNOSIS — G894 Chronic pain syndrome: Secondary | ICD-10-CM | POA: Diagnosis not present

## 2021-07-12 DIAGNOSIS — Z23 Encounter for immunization: Secondary | ICD-10-CM | POA: Diagnosis not present

## 2021-07-12 DIAGNOSIS — R7303 Prediabetes: Secondary | ICD-10-CM | POA: Diagnosis not present

## 2021-07-12 DIAGNOSIS — E440 Moderate protein-calorie malnutrition: Secondary | ICD-10-CM | POA: Diagnosis not present

## 2021-07-12 DIAGNOSIS — Z0000 Encounter for general adult medical examination without abnormal findings: Secondary | ICD-10-CM | POA: Diagnosis not present

## 2021-07-12 DIAGNOSIS — F319 Bipolar disorder, unspecified: Secondary | ICD-10-CM | POA: Diagnosis not present

## 2021-07-12 DIAGNOSIS — I10 Essential (primary) hypertension: Secondary | ICD-10-CM | POA: Diagnosis not present

## 2021-07-16 DIAGNOSIS — M1712 Unilateral primary osteoarthritis, left knee: Secondary | ICD-10-CM | POA: Diagnosis not present

## 2021-07-16 DIAGNOSIS — I10 Essential (primary) hypertension: Secondary | ICD-10-CM | POA: Diagnosis not present

## 2021-07-16 DIAGNOSIS — F319 Bipolar disorder, unspecified: Secondary | ICD-10-CM | POA: Diagnosis not present

## 2021-07-16 DIAGNOSIS — K219 Gastro-esophageal reflux disease without esophagitis: Secondary | ICD-10-CM | POA: Diagnosis not present

## 2021-07-16 DIAGNOSIS — E785 Hyperlipidemia, unspecified: Secondary | ICD-10-CM | POA: Diagnosis not present

## 2021-07-20 ENCOUNTER — Ambulatory Visit
Admission: RE | Admit: 2021-07-20 | Discharge: 2021-07-20 | Disposition: A | Discharge status: Home / Self Care | Payer: PPO | Source: Ambulatory Visit | Attending: Family Medicine | Admitting: Family Medicine

## 2021-07-20 DIAGNOSIS — Z1231 Encounter for screening mammogram for malignant neoplasm of breast: Secondary | ICD-10-CM | POA: Diagnosis not present

## 2021-08-09 DIAGNOSIS — R140 Abdominal distension (gaseous): Secondary | ICD-10-CM | POA: Diagnosis not present

## 2021-08-09 DIAGNOSIS — R1012 Left upper quadrant pain: Secondary | ICD-10-CM | POA: Diagnosis not present

## 2021-08-09 DIAGNOSIS — R634 Abnormal weight loss: Secondary | ICD-10-CM | POA: Diagnosis not present

## 2021-08-12 DIAGNOSIS — Z9013 Acquired absence of bilateral breasts and nipples: Secondary | ICD-10-CM | POA: Diagnosis not present

## 2021-08-17 DIAGNOSIS — M174 Other bilateral secondary osteoarthritis of knee: Secondary | ICD-10-CM | POA: Diagnosis not present

## 2021-08-17 DIAGNOSIS — G894 Chronic pain syndrome: Secondary | ICD-10-CM | POA: Diagnosis not present

## 2021-08-17 DIAGNOSIS — I10 Essential (primary) hypertension: Secondary | ICD-10-CM | POA: Diagnosis not present

## 2021-08-17 DIAGNOSIS — M47816 Spondylosis without myelopathy or radiculopathy, lumbar region: Secondary | ICD-10-CM | POA: Diagnosis not present

## 2021-08-17 DIAGNOSIS — M40204 Unspecified kyphosis, thoracic region: Secondary | ICD-10-CM | POA: Diagnosis not present

## 2021-08-18 DIAGNOSIS — E440 Moderate protein-calorie malnutrition: Secondary | ICD-10-CM | POA: Diagnosis not present

## 2021-08-19 DIAGNOSIS — Z9013 Acquired absence of bilateral breasts and nipples: Secondary | ICD-10-CM | POA: Diagnosis not present

## 2021-09-17 DIAGNOSIS — R634 Abnormal weight loss: Secondary | ICD-10-CM | POA: Diagnosis not present

## 2021-09-17 DIAGNOSIS — R140 Abdominal distension (gaseous): Secondary | ICD-10-CM | POA: Diagnosis not present

## 2021-09-17 DIAGNOSIS — R1084 Generalized abdominal pain: Secondary | ICD-10-CM | POA: Diagnosis not present

## 2021-09-17 DIAGNOSIS — R109 Unspecified abdominal pain: Secondary | ICD-10-CM | POA: Diagnosis not present

## 2021-09-28 DIAGNOSIS — I10 Essential (primary) hypertension: Secondary | ICD-10-CM | POA: Diagnosis not present

## 2021-09-28 DIAGNOSIS — E440 Moderate protein-calorie malnutrition: Secondary | ICD-10-CM | POA: Diagnosis not present

## 2021-09-28 DIAGNOSIS — E785 Hyperlipidemia, unspecified: Secondary | ICD-10-CM | POA: Diagnosis not present

## 2021-10-11 DIAGNOSIS — K219 Gastro-esophageal reflux disease without esophagitis: Secondary | ICD-10-CM | POA: Diagnosis not present

## 2021-10-11 DIAGNOSIS — E785 Hyperlipidemia, unspecified: Secondary | ICD-10-CM | POA: Diagnosis not present

## 2021-10-11 DIAGNOSIS — I10 Essential (primary) hypertension: Secondary | ICD-10-CM | POA: Diagnosis not present

## 2021-10-11 DIAGNOSIS — R7303 Prediabetes: Secondary | ICD-10-CM | POA: Diagnosis not present

## 2021-10-11 DIAGNOSIS — F319 Bipolar disorder, unspecified: Secondary | ICD-10-CM | POA: Diagnosis not present

## 2021-10-11 DIAGNOSIS — R1012 Left upper quadrant pain: Secondary | ICD-10-CM | POA: Diagnosis not present

## 2021-10-11 DIAGNOSIS — E440 Moderate protein-calorie malnutrition: Secondary | ICD-10-CM | POA: Diagnosis not present

## 2021-10-11 DIAGNOSIS — G894 Chronic pain syndrome: Secondary | ICD-10-CM | POA: Diagnosis not present

## 2021-10-12 DIAGNOSIS — M47816 Spondylosis without myelopathy or radiculopathy, lumbar region: Secondary | ICD-10-CM | POA: Diagnosis not present

## 2021-10-12 DIAGNOSIS — M40204 Unspecified kyphosis, thoracic region: Secondary | ICD-10-CM | POA: Diagnosis not present

## 2021-10-12 DIAGNOSIS — M174 Other bilateral secondary osteoarthritis of knee: Secondary | ICD-10-CM | POA: Diagnosis not present

## 2021-10-12 DIAGNOSIS — G894 Chronic pain syndrome: Secondary | ICD-10-CM | POA: Diagnosis not present

## 2021-11-02 DIAGNOSIS — L433 Subacute (active) lichen planus: Secondary | ICD-10-CM | POA: Diagnosis not present

## 2021-11-03 DIAGNOSIS — E440 Moderate protein-calorie malnutrition: Secondary | ICD-10-CM | POA: Diagnosis not present

## 2021-11-19 DIAGNOSIS — R1012 Left upper quadrant pain: Secondary | ICD-10-CM | POA: Diagnosis not present

## 2021-11-24 DIAGNOSIS — Z9013 Acquired absence of bilateral breasts and nipples: Secondary | ICD-10-CM | POA: Diagnosis not present

## 2021-12-07 DIAGNOSIS — M47816 Spondylosis without myelopathy or radiculopathy, lumbar region: Secondary | ICD-10-CM | POA: Diagnosis not present

## 2021-12-07 DIAGNOSIS — G894 Chronic pain syndrome: Secondary | ICD-10-CM | POA: Diagnosis not present

## 2021-12-07 DIAGNOSIS — M174 Other bilateral secondary osteoarthritis of knee: Secondary | ICD-10-CM | POA: Diagnosis not present

## 2021-12-07 DIAGNOSIS — Z79891 Long term (current) use of opiate analgesic: Secondary | ICD-10-CM | POA: Diagnosis not present

## 2021-12-07 DIAGNOSIS — M40204 Unspecified kyphosis, thoracic region: Secondary | ICD-10-CM | POA: Diagnosis not present

## 2021-12-08 DIAGNOSIS — Z7689 Persons encountering health services in other specified circumstances: Secondary | ICD-10-CM | POA: Diagnosis not present

## 2021-12-18 DIAGNOSIS — F5101 Primary insomnia: Secondary | ICD-10-CM | POA: Diagnosis not present

## 2021-12-18 DIAGNOSIS — F4321 Adjustment disorder with depressed mood: Secondary | ICD-10-CM | POA: Diagnosis not present

## 2021-12-18 DIAGNOSIS — F410 Panic disorder [episodic paroxysmal anxiety] without agoraphobia: Secondary | ICD-10-CM | POA: Diagnosis not present

## 2021-12-18 DIAGNOSIS — F900 Attention-deficit hyperactivity disorder, predominantly inattentive type: Secondary | ICD-10-CM | POA: Diagnosis not present

## 2021-12-28 DIAGNOSIS — I10 Essential (primary) hypertension: Secondary | ICD-10-CM | POA: Diagnosis not present

## 2021-12-28 DIAGNOSIS — E785 Hyperlipidemia, unspecified: Secondary | ICD-10-CM | POA: Diagnosis not present

## 2021-12-28 DIAGNOSIS — K219 Gastro-esophageal reflux disease without esophagitis: Secondary | ICD-10-CM | POA: Diagnosis not present

## 2021-12-28 DIAGNOSIS — F319 Bipolar disorder, unspecified: Secondary | ICD-10-CM | POA: Diagnosis not present

## 2022-01-10 DIAGNOSIS — R7303 Prediabetes: Secondary | ICD-10-CM | POA: Diagnosis not present

## 2022-01-10 DIAGNOSIS — E559 Vitamin D deficiency, unspecified: Secondary | ICD-10-CM | POA: Diagnosis not present

## 2022-01-10 DIAGNOSIS — E440 Moderate protein-calorie malnutrition: Secondary | ICD-10-CM | POA: Diagnosis not present

## 2022-01-10 DIAGNOSIS — M25531 Pain in right wrist: Secondary | ICD-10-CM | POA: Diagnosis not present

## 2022-01-10 DIAGNOSIS — K219 Gastro-esophageal reflux disease without esophagitis: Secondary | ICD-10-CM | POA: Diagnosis not present

## 2022-01-10 DIAGNOSIS — N952 Postmenopausal atrophic vaginitis: Secondary | ICD-10-CM | POA: Diagnosis not present

## 2022-01-25 DIAGNOSIS — R109 Unspecified abdominal pain: Secondary | ICD-10-CM | POA: Diagnosis not present

## 2022-01-25 DIAGNOSIS — R634 Abnormal weight loss: Secondary | ICD-10-CM | POA: Diagnosis not present

## 2022-01-26 DIAGNOSIS — R109 Unspecified abdominal pain: Secondary | ICD-10-CM | POA: Diagnosis not present

## 2022-01-26 DIAGNOSIS — R635 Abnormal weight gain: Secondary | ICD-10-CM | POA: Diagnosis not present

## 2022-01-26 DIAGNOSIS — R634 Abnormal weight loss: Secondary | ICD-10-CM | POA: Diagnosis not present

## 2022-01-26 DIAGNOSIS — Z1211 Encounter for screening for malignant neoplasm of colon: Secondary | ICD-10-CM | POA: Diagnosis not present

## 2022-01-26 DIAGNOSIS — K648 Other hemorrhoids: Secondary | ICD-10-CM | POA: Diagnosis not present

## 2022-01-31 DIAGNOSIS — E785 Hyperlipidemia, unspecified: Secondary | ICD-10-CM | POA: Diagnosis not present

## 2022-01-31 DIAGNOSIS — E440 Moderate protein-calorie malnutrition: Secondary | ICD-10-CM | POA: Diagnosis not present

## 2022-01-31 DIAGNOSIS — K219 Gastro-esophageal reflux disease without esophagitis: Secondary | ICD-10-CM | POA: Diagnosis not present

## 2022-01-31 DIAGNOSIS — I10 Essential (primary) hypertension: Secondary | ICD-10-CM | POA: Diagnosis not present

## 2022-02-01 DIAGNOSIS — M40204 Unspecified kyphosis, thoracic region: Secondary | ICD-10-CM | POA: Diagnosis not present

## 2022-02-01 DIAGNOSIS — M174 Other bilateral secondary osteoarthritis of knee: Secondary | ICD-10-CM | POA: Diagnosis not present

## 2022-02-01 DIAGNOSIS — M47816 Spondylosis without myelopathy or radiculopathy, lumbar region: Secondary | ICD-10-CM | POA: Diagnosis not present

## 2022-02-01 DIAGNOSIS — G894 Chronic pain syndrome: Secondary | ICD-10-CM | POA: Diagnosis not present

## 2022-03-03 DIAGNOSIS — R109 Unspecified abdominal pain: Secondary | ICD-10-CM | POA: Diagnosis not present

## 2022-03-03 DIAGNOSIS — R634 Abnormal weight loss: Secondary | ICD-10-CM | POA: Diagnosis not present

## 2022-03-04 DIAGNOSIS — D443 Neoplasm of uncertain behavior of pituitary gland: Secondary | ICD-10-CM | POA: Diagnosis not present

## 2022-03-04 DIAGNOSIS — H40013 Open angle with borderline findings, low risk, bilateral: Secondary | ICD-10-CM | POA: Diagnosis not present

## 2022-03-04 DIAGNOSIS — H2513 Age-related nuclear cataract, bilateral: Secondary | ICD-10-CM | POA: Diagnosis not present

## 2022-03-29 DIAGNOSIS — M174 Other bilateral secondary osteoarthritis of knee: Secondary | ICD-10-CM | POA: Diagnosis not present

## 2022-03-29 DIAGNOSIS — G894 Chronic pain syndrome: Secondary | ICD-10-CM | POA: Diagnosis not present

## 2022-03-29 DIAGNOSIS — M47816 Spondylosis without myelopathy or radiculopathy, lumbar region: Secondary | ICD-10-CM | POA: Diagnosis not present

## 2022-03-29 DIAGNOSIS — M40204 Unspecified kyphosis, thoracic region: Secondary | ICD-10-CM | POA: Diagnosis not present

## 2022-04-11 DIAGNOSIS — K219 Gastro-esophageal reflux disease without esophagitis: Secondary | ICD-10-CM | POA: Diagnosis not present

## 2022-04-11 DIAGNOSIS — I10 Essential (primary) hypertension: Secondary | ICD-10-CM | POA: Diagnosis not present

## 2022-04-11 DIAGNOSIS — E785 Hyperlipidemia, unspecified: Secondary | ICD-10-CM | POA: Diagnosis not present

## 2022-04-11 DIAGNOSIS — Z23 Encounter for immunization: Secondary | ICD-10-CM | POA: Diagnosis not present

## 2022-04-11 DIAGNOSIS — F319 Bipolar disorder, unspecified: Secondary | ICD-10-CM | POA: Diagnosis not present

## 2022-04-11 DIAGNOSIS — E440 Moderate protein-calorie malnutrition: Secondary | ICD-10-CM | POA: Diagnosis not present

## 2022-05-17 DIAGNOSIS — E785 Hyperlipidemia, unspecified: Secondary | ICD-10-CM | POA: Diagnosis not present

## 2022-05-17 DIAGNOSIS — K219 Gastro-esophageal reflux disease without esophagitis: Secondary | ICD-10-CM | POA: Diagnosis not present

## 2022-05-17 DIAGNOSIS — F319 Bipolar disorder, unspecified: Secondary | ICD-10-CM | POA: Diagnosis not present

## 2022-05-17 DIAGNOSIS — I10 Essential (primary) hypertension: Secondary | ICD-10-CM | POA: Diagnosis not present

## 2022-05-24 DIAGNOSIS — M47816 Spondylosis without myelopathy or radiculopathy, lumbar region: Secondary | ICD-10-CM | POA: Diagnosis not present

## 2022-05-24 DIAGNOSIS — G894 Chronic pain syndrome: Secondary | ICD-10-CM | POA: Diagnosis not present

## 2022-05-24 DIAGNOSIS — M40204 Unspecified kyphosis, thoracic region: Secondary | ICD-10-CM | POA: Diagnosis not present

## 2022-05-24 DIAGNOSIS — M174 Other bilateral secondary osteoarthritis of knee: Secondary | ICD-10-CM | POA: Diagnosis not present

## 2022-05-24 DIAGNOSIS — Z79891 Long term (current) use of opiate analgesic: Secondary | ICD-10-CM | POA: Diagnosis not present

## 2022-06-23 DIAGNOSIS — M174 Other bilateral secondary osteoarthritis of knee: Secondary | ICD-10-CM | POA: Diagnosis not present

## 2022-06-23 DIAGNOSIS — G894 Chronic pain syndrome: Secondary | ICD-10-CM | POA: Diagnosis not present

## 2022-06-23 DIAGNOSIS — M47816 Spondylosis without myelopathy or radiculopathy, lumbar region: Secondary | ICD-10-CM | POA: Diagnosis not present

## 2022-06-23 DIAGNOSIS — M40204 Unspecified kyphosis, thoracic region: Secondary | ICD-10-CM | POA: Diagnosis not present

## 2022-06-24 DIAGNOSIS — F411 Generalized anxiety disorder: Secondary | ICD-10-CM | POA: Diagnosis not present

## 2022-06-24 DIAGNOSIS — F331 Major depressive disorder, recurrent, moderate: Secondary | ICD-10-CM | POA: Diagnosis not present

## 2022-06-24 DIAGNOSIS — F900 Attention-deficit hyperactivity disorder, predominantly inattentive type: Secondary | ICD-10-CM | POA: Diagnosis not present

## 2022-06-24 DIAGNOSIS — G4700 Insomnia, unspecified: Secondary | ICD-10-CM | POA: Diagnosis not present

## 2022-07-14 ENCOUNTER — Other Ambulatory Visit (HOSPITAL_COMMUNITY): Payer: Self-pay

## 2022-07-18 ENCOUNTER — Other Ambulatory Visit (HOSPITAL_COMMUNITY): Payer: Self-pay

## 2022-07-18 DIAGNOSIS — G894 Chronic pain syndrome: Secondary | ICD-10-CM | POA: Diagnosis not present

## 2022-07-18 DIAGNOSIS — M40204 Unspecified kyphosis, thoracic region: Secondary | ICD-10-CM | POA: Diagnosis not present

## 2022-07-18 DIAGNOSIS — M174 Other bilateral secondary osteoarthritis of knee: Secondary | ICD-10-CM | POA: Diagnosis not present

## 2022-07-18 DIAGNOSIS — M47816 Spondylosis without myelopathy or radiculopathy, lumbar region: Secondary | ICD-10-CM | POA: Diagnosis not present

## 2022-07-18 MED ORDER — FENTANYL 25 MCG/HR TD PT72
1.0000 | MEDICATED_PATCH | TRANSDERMAL | 0 refills | Status: AC | PRN
  Filled 2022-08-19: qty 15, 30d supply, fill #0

## 2022-07-18 MED ORDER — OXYCODONE-ACETAMINOPHEN 5-325 MG PO TABS
1.0000 | ORAL_TABLET | Freq: Four times a day (QID) | ORAL | 0 refills | Status: AC | PRN
  Filled 2022-08-15: qty 120, 30d supply, fill #0

## 2022-07-18 MED ORDER — OXYCODONE-ACETAMINOPHEN 5-325 MG PO TABS
1.0000 | ORAL_TABLET | Freq: Four times a day (QID) | ORAL | 0 refills | Status: AC | PRN
  Filled 2022-09-13: qty 120, 30d supply, fill #0

## 2022-07-18 MED ORDER — FENTANYL 25 MCG/HR TD PT72
1.0000 | MEDICATED_PATCH | TRANSDERMAL | 0 refills | Status: AC | PRN
  Filled 2022-07-21 (×2): qty 15, 30d supply, fill #0

## 2022-07-19 ENCOUNTER — Other Ambulatory Visit (HOSPITAL_COMMUNITY): Payer: Self-pay

## 2022-07-20 ENCOUNTER — Other Ambulatory Visit (HOSPITAL_COMMUNITY): Payer: Self-pay

## 2022-07-21 ENCOUNTER — Other Ambulatory Visit (HOSPITAL_COMMUNITY): Payer: Self-pay

## 2022-07-21 ENCOUNTER — Other Ambulatory Visit: Payer: Self-pay

## 2022-07-22 ENCOUNTER — Other Ambulatory Visit (HOSPITAL_COMMUNITY): Payer: Self-pay

## 2022-07-25 DIAGNOSIS — Z124 Encounter for screening for malignant neoplasm of cervix: Secondary | ICD-10-CM | POA: Diagnosis not present

## 2022-07-25 DIAGNOSIS — Z0000 Encounter for general adult medical examination without abnormal findings: Secondary | ICD-10-CM | POA: Diagnosis not present

## 2022-07-25 DIAGNOSIS — Z79899 Other long term (current) drug therapy: Secondary | ICD-10-CM | POA: Diagnosis not present

## 2022-07-25 DIAGNOSIS — I10 Essential (primary) hypertension: Secondary | ICD-10-CM | POA: Diagnosis not present

## 2022-07-25 DIAGNOSIS — E785 Hyperlipidemia, unspecified: Secondary | ICD-10-CM | POA: Diagnosis not present

## 2022-07-25 DIAGNOSIS — G894 Chronic pain syndrome: Secondary | ICD-10-CM | POA: Diagnosis not present

## 2022-07-25 DIAGNOSIS — G4733 Obstructive sleep apnea (adult) (pediatric): Secondary | ICD-10-CM | POA: Diagnosis not present

## 2022-07-25 DIAGNOSIS — F319 Bipolar disorder, unspecified: Secondary | ICD-10-CM | POA: Diagnosis not present

## 2022-07-25 DIAGNOSIS — Z1159 Encounter for screening for other viral diseases: Secondary | ICD-10-CM | POA: Diagnosis not present

## 2022-07-25 DIAGNOSIS — E559 Vitamin D deficiency, unspecified: Secondary | ICD-10-CM | POA: Diagnosis not present

## 2022-07-25 DIAGNOSIS — R7303 Prediabetes: Secondary | ICD-10-CM | POA: Diagnosis not present

## 2022-07-25 DIAGNOSIS — J301 Allergic rhinitis due to pollen: Secondary | ICD-10-CM | POA: Diagnosis not present

## 2022-07-25 DIAGNOSIS — K219 Gastro-esophageal reflux disease without esophagitis: Secondary | ICD-10-CM | POA: Diagnosis not present

## 2022-08-04 DIAGNOSIS — F319 Bipolar disorder, unspecified: Secondary | ICD-10-CM | POA: Diagnosis not present

## 2022-08-04 DIAGNOSIS — I10 Essential (primary) hypertension: Secondary | ICD-10-CM | POA: Diagnosis not present

## 2022-08-15 ENCOUNTER — Other Ambulatory Visit (HOSPITAL_COMMUNITY): Payer: Self-pay

## 2022-08-17 ENCOUNTER — Other Ambulatory Visit (HOSPITAL_COMMUNITY): Payer: Self-pay

## 2022-08-18 ENCOUNTER — Other Ambulatory Visit (HOSPITAL_COMMUNITY): Payer: Self-pay

## 2022-08-19 ENCOUNTER — Other Ambulatory Visit (HOSPITAL_COMMUNITY): Payer: Self-pay

## 2022-09-12 ENCOUNTER — Other Ambulatory Visit (HOSPITAL_COMMUNITY): Payer: Self-pay

## 2022-09-12 DIAGNOSIS — M174 Other bilateral secondary osteoarthritis of knee: Secondary | ICD-10-CM | POA: Diagnosis not present

## 2022-09-12 DIAGNOSIS — M47816 Spondylosis without myelopathy or radiculopathy, lumbar region: Secondary | ICD-10-CM | POA: Diagnosis not present

## 2022-09-12 DIAGNOSIS — G894 Chronic pain syndrome: Secondary | ICD-10-CM | POA: Diagnosis not present

## 2022-09-12 DIAGNOSIS — M40204 Unspecified kyphosis, thoracic region: Secondary | ICD-10-CM | POA: Diagnosis not present

## 2022-09-12 MED ORDER — FENTANYL 25 MCG/HR TD PT72
1.0000 | MEDICATED_PATCH | TRANSDERMAL | 0 refills | Status: AC
  Filled 2022-09-16: qty 15, 30d supply, fill #0

## 2022-09-12 MED ORDER — OXYCODONE-ACETAMINOPHEN 5-325 MG PO TABS
1.0000 | ORAL_TABLET | Freq: Four times a day (QID) | ORAL | 0 refills | Status: AC | PRN
  Filled 2022-10-12: qty 120, 30d supply, fill #0

## 2022-09-12 MED ORDER — FENTANYL 25 MCG/HR TD PT72
1.0000 | MEDICATED_PATCH | TRANSDERMAL | 0 refills | Status: DC
  Filled 2022-10-14: qty 15, 30d supply, fill #0

## 2022-09-12 MED ORDER — BACLOFEN 5 MG PO TABS
5.0000 mg | ORAL_TABLET | Freq: Every evening | ORAL | 2 refills | Status: AC | PRN
  Filled 2022-09-12: qty 30, 30d supply, fill #0

## 2022-09-12 MED ORDER — OXYCODONE-ACETAMINOPHEN 5-325 MG PO TABS
1.0000 | ORAL_TABLET | Freq: Four times a day (QID) | ORAL | 0 refills | Status: AC | PRN
  Filled 2022-12-02: qty 120, 30d supply, fill #0

## 2022-09-13 ENCOUNTER — Other Ambulatory Visit (HOSPITAL_COMMUNITY): Payer: Self-pay

## 2022-09-15 ENCOUNTER — Other Ambulatory Visit (HOSPITAL_COMMUNITY): Payer: Self-pay

## 2022-09-16 ENCOUNTER — Other Ambulatory Visit (HOSPITAL_COMMUNITY): Payer: Self-pay

## 2022-09-16 ENCOUNTER — Other Ambulatory Visit: Payer: Self-pay

## 2022-10-03 DIAGNOSIS — G894 Chronic pain syndrome: Secondary | ICD-10-CM | POA: Diagnosis not present

## 2022-10-03 DIAGNOSIS — Z79891 Long term (current) use of opiate analgesic: Secondary | ICD-10-CM | POA: Diagnosis not present

## 2022-10-03 DIAGNOSIS — M25512 Pain in left shoulder: Secondary | ICD-10-CM | POA: Diagnosis not present

## 2022-10-03 DIAGNOSIS — M47816 Spondylosis without myelopathy or radiculopathy, lumbar region: Secondary | ICD-10-CM | POA: Diagnosis not present

## 2022-10-03 DIAGNOSIS — M40204 Unspecified kyphosis, thoracic region: Secondary | ICD-10-CM | POA: Diagnosis not present

## 2022-10-12 ENCOUNTER — Other Ambulatory Visit (HOSPITAL_COMMUNITY): Payer: Self-pay

## 2022-10-14 ENCOUNTER — Other Ambulatory Visit: Payer: Self-pay

## 2022-10-14 ENCOUNTER — Other Ambulatory Visit (HOSPITAL_COMMUNITY): Payer: Self-pay

## 2022-10-23 IMAGING — MG MM DIGITAL SCREENING BILAT W/ TOMO AND CAD
6 of 12 series · 6 of 36 positions shown · non-contrast
Comparison: Previous exam(s).

CLINICAL DATA: Screening.

EXAM:
DIGITAL SCREENING BILATERAL MAMMOGRAM WITH TOMOSYNTHESIS AND CAD
TECHNIQUE: Bilateral screening digital craniocaudal and mediolateral oblique
mammograms were obtained. Bilateral screening digital breast
tomosynthesis was performed. The images were evaluated with
computer-aided detection.

[L CC synth-2D]
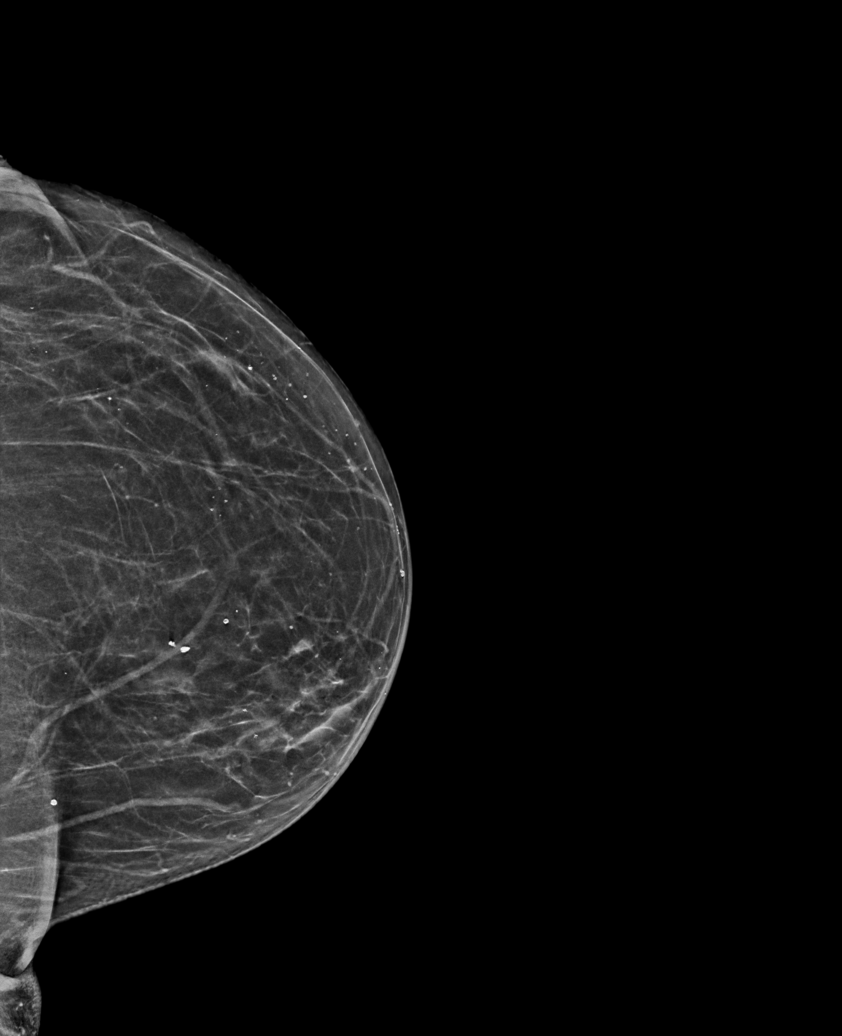

[R MLO synth-2D (1 of 2)]
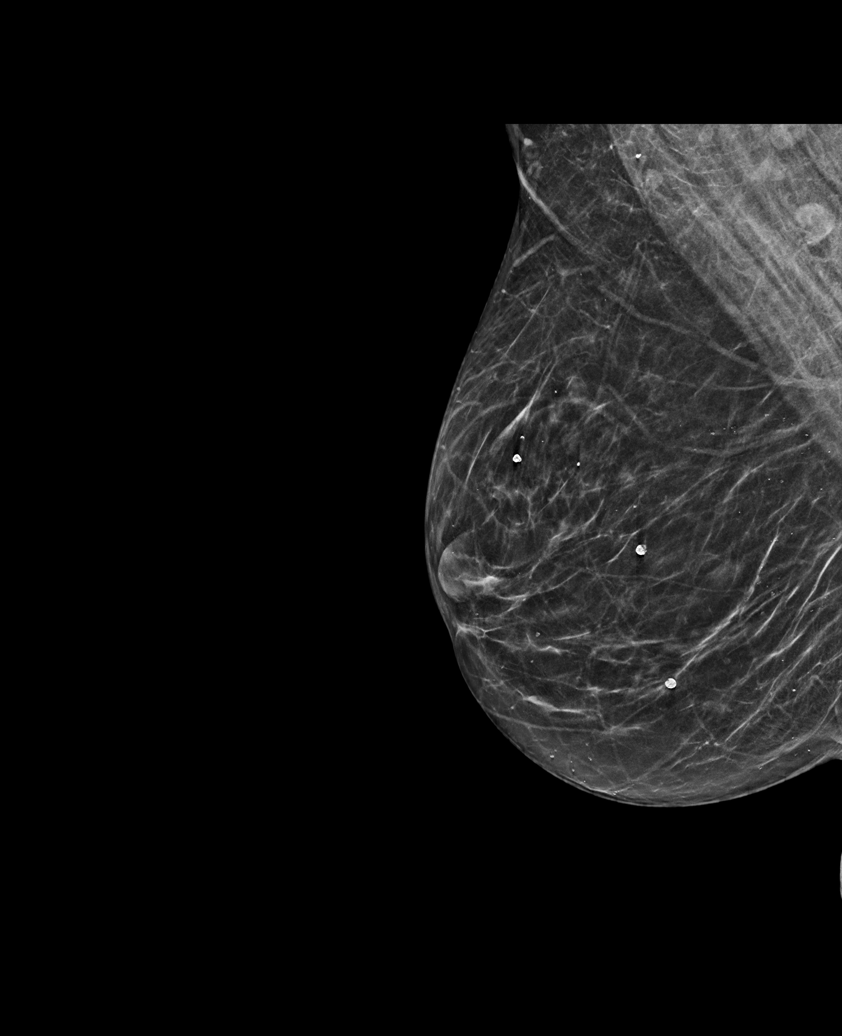

[L MLO synth-2D (1 of 2)]
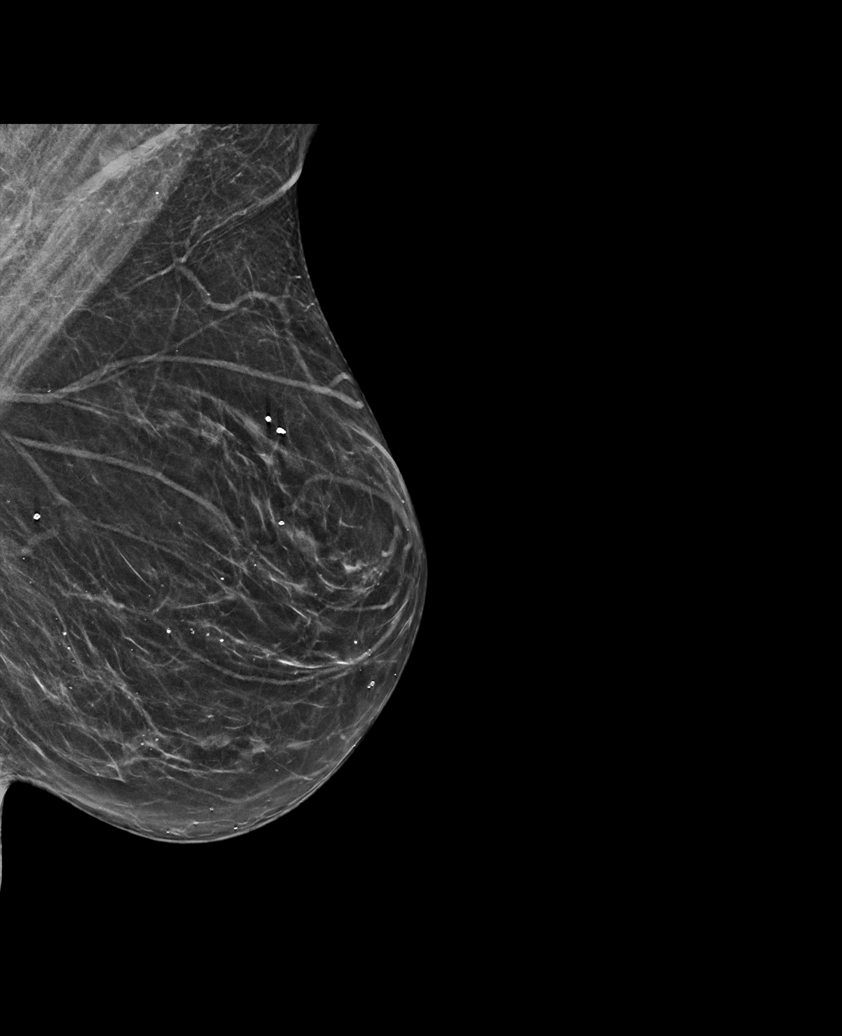

[R MLO synth-2D (2 of 2)]
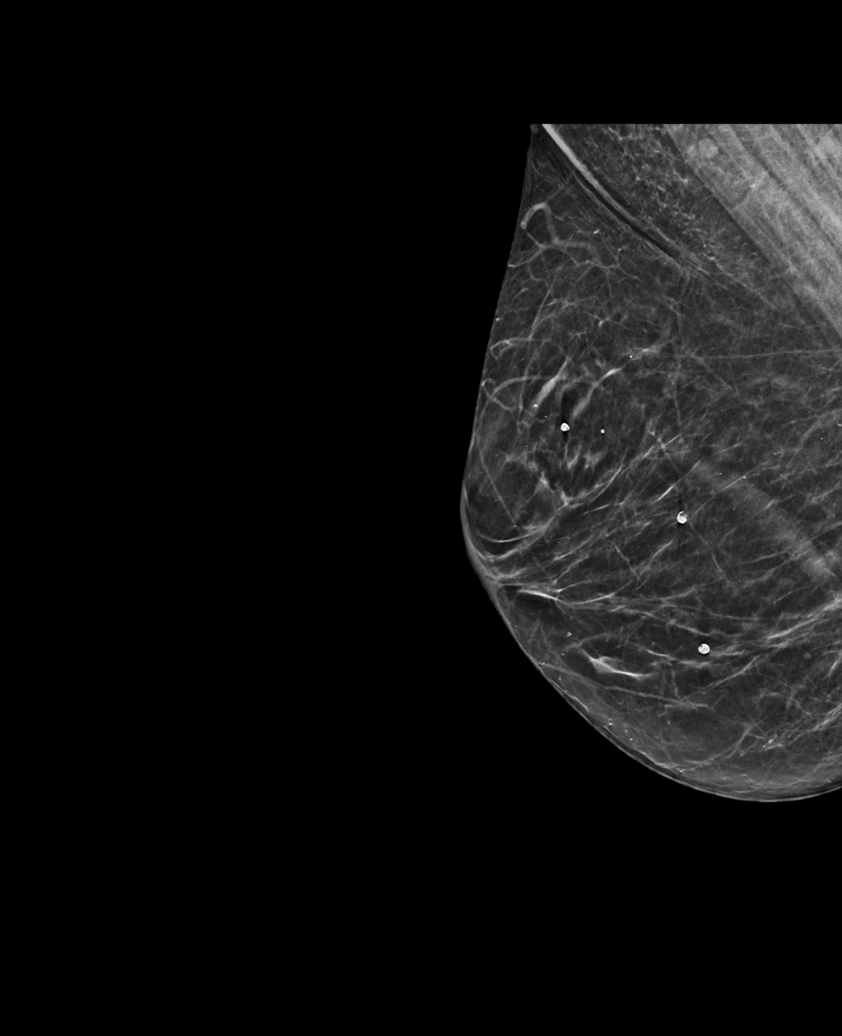

[L MLO synth-2D (2 of 2)]
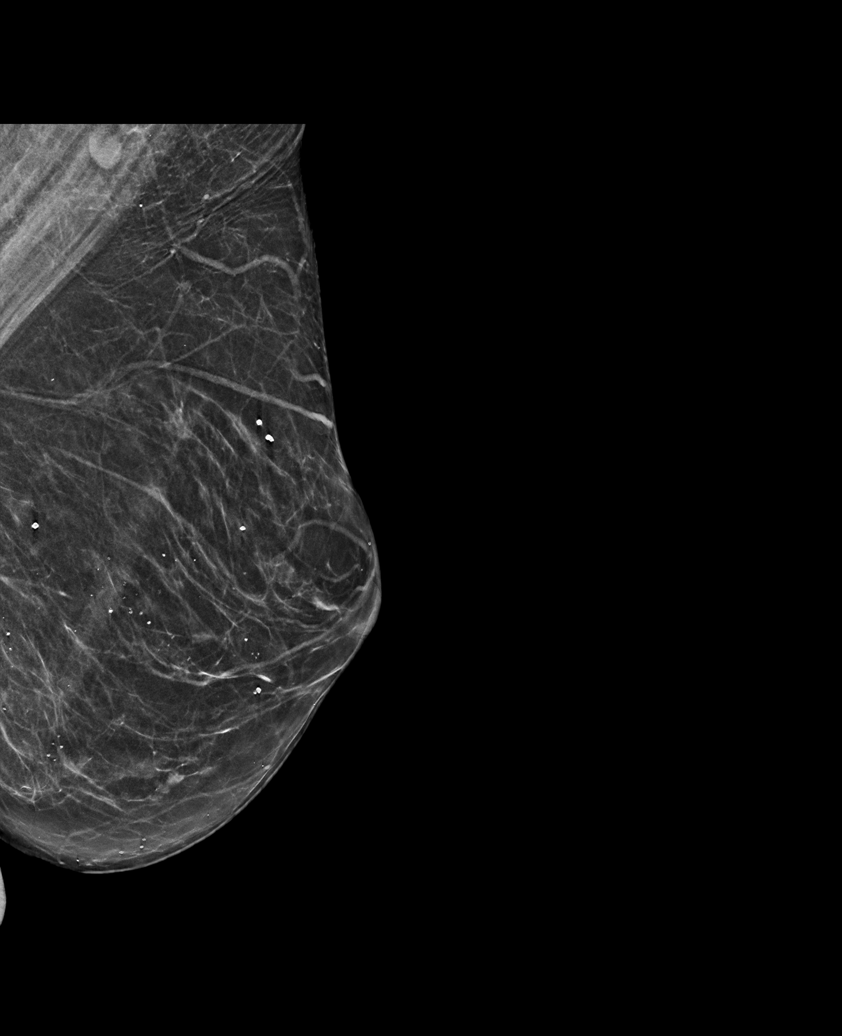

[R CC synth-2D]
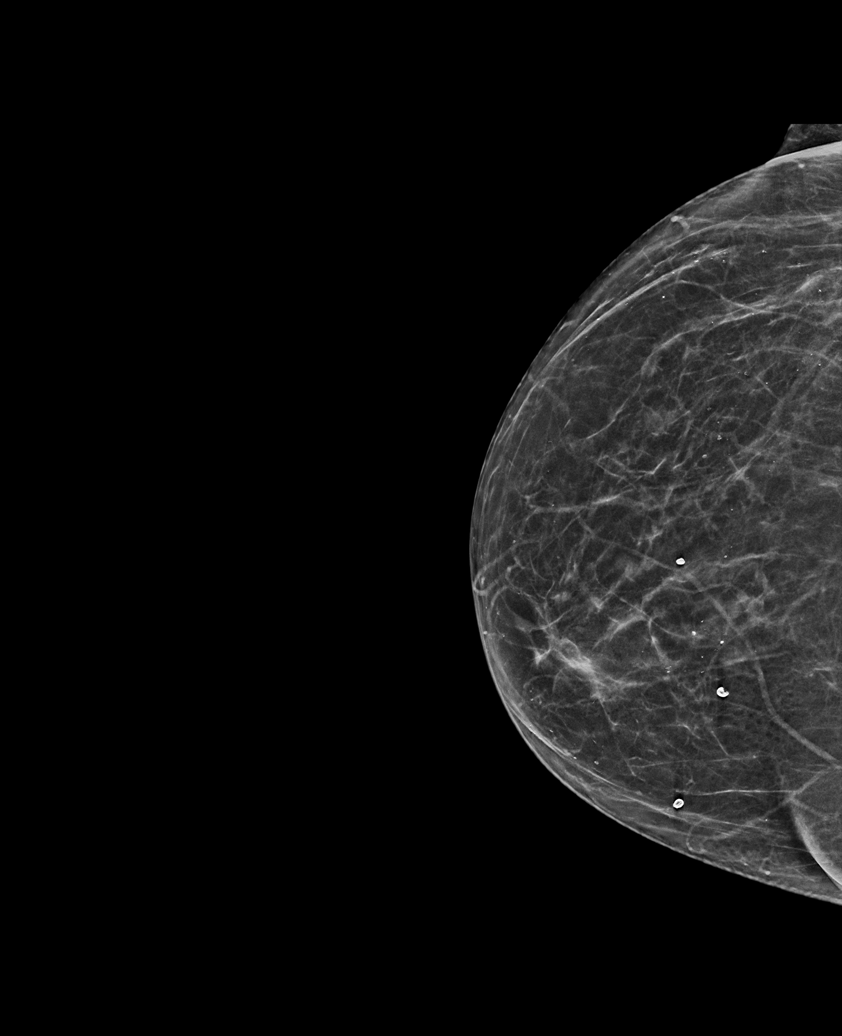

[6 of 36 positions shown; findings below may reference images not displayed]

ACR Breast Density Category b: There are scattered areas of
fibroglandular density.
FINDINGS: There are no findings suspicious for malignancy. Stable post
reduction changes.
IMPRESSION: No mammographic evidence of malignancy. A result letter of this
screening mammogram will be mailed directly to the patient.

RECOMMENDATION:
Screening mammogram in one year. (Code:CG-K-XKU)

BI-RADS CATEGORY  2: Benign.

## 2022-11-07 ENCOUNTER — Other Ambulatory Visit (HOSPITAL_COMMUNITY): Payer: Self-pay

## 2022-11-07 ENCOUNTER — Other Ambulatory Visit: Payer: Self-pay

## 2022-11-07 DIAGNOSIS — M25512 Pain in left shoulder: Secondary | ICD-10-CM | POA: Diagnosis not present

## 2022-11-07 DIAGNOSIS — M40204 Unspecified kyphosis, thoracic region: Secondary | ICD-10-CM | POA: Diagnosis not present

## 2022-11-07 DIAGNOSIS — M47816 Spondylosis without myelopathy or radiculopathy, lumbar region: Secondary | ICD-10-CM | POA: Diagnosis not present

## 2022-11-07 DIAGNOSIS — G894 Chronic pain syndrome: Secondary | ICD-10-CM | POA: Diagnosis not present

## 2022-11-07 MED ORDER — FENTANYL 25 MCG/HR TD PT72
MEDICATED_PATCH | TRANSDERMAL | 0 refills | Status: AC
  Filled 2022-11-07 – 2022-11-11 (×2): qty 15, 30d supply, fill #0

## 2022-11-07 MED ORDER — OXYCODONE-ACETAMINOPHEN 5-325 MG PO TABS
ORAL_TABLET | ORAL | 0 refills | Status: AC
  Filled 2023-01-06: qty 120, 30d supply, fill #0
  Filled ????-??-??: fill #0

## 2022-11-07 MED ORDER — FENTANYL 25 MCG/HR TD PT72
MEDICATED_PATCH | TRANSDERMAL | 0 refills | Status: AC
  Filled 2022-12-02: qty 15, 30d supply, fill #0
  Filled ????-??-??: fill #0

## 2022-11-07 MED ORDER — OXYCODONE-ACETAMINOPHEN 5-325 MG PO TABS
ORAL_TABLET | ORAL | 0 refills | Status: AC
  Filled 2022-11-09 – 2022-11-11 (×2): qty 120, 30d supply, fill #0

## 2022-11-09 ENCOUNTER — Other Ambulatory Visit: Payer: Self-pay

## 2022-11-09 ENCOUNTER — Other Ambulatory Visit (HOSPITAL_COMMUNITY): Payer: Self-pay

## 2022-11-11 ENCOUNTER — Other Ambulatory Visit (HOSPITAL_COMMUNITY): Payer: Self-pay

## 2022-12-02 ENCOUNTER — Other Ambulatory Visit (HOSPITAL_COMMUNITY): Payer: Self-pay

## 2022-12-09 ENCOUNTER — Other Ambulatory Visit (HOSPITAL_COMMUNITY): Payer: Self-pay

## 2022-12-16 DIAGNOSIS — F5101 Primary insomnia: Secondary | ICD-10-CM | POA: Diagnosis not present

## 2022-12-16 DIAGNOSIS — F4322 Adjustment disorder with anxiety: Secondary | ICD-10-CM | POA: Diagnosis not present

## 2022-12-16 DIAGNOSIS — F900 Attention-deficit hyperactivity disorder, predominantly inattentive type: Secondary | ICD-10-CM | POA: Diagnosis not present

## 2022-12-26 DIAGNOSIS — Z9013 Acquired absence of bilateral breasts and nipples: Secondary | ICD-10-CM | POA: Diagnosis not present

## 2023-01-03 ENCOUNTER — Other Ambulatory Visit (HOSPITAL_COMMUNITY): Payer: Self-pay

## 2023-01-03 DIAGNOSIS — M47816 Spondylosis without myelopathy or radiculopathy, lumbar region: Secondary | ICD-10-CM | POA: Diagnosis not present

## 2023-01-03 DIAGNOSIS — Z9013 Acquired absence of bilateral breasts and nipples: Secondary | ICD-10-CM | POA: Diagnosis not present

## 2023-01-03 DIAGNOSIS — M40204 Unspecified kyphosis, thoracic region: Secondary | ICD-10-CM | POA: Diagnosis not present

## 2023-01-03 DIAGNOSIS — G894 Chronic pain syndrome: Secondary | ICD-10-CM | POA: Diagnosis not present

## 2023-01-03 DIAGNOSIS — M25512 Pain in left shoulder: Secondary | ICD-10-CM | POA: Diagnosis not present

## 2023-01-03 MED ORDER — OXYCODONE-ACETAMINOPHEN 5-325 MG PO TABS
ORAL_TABLET | ORAL | 0 refills | Status: AC
  Filled 2023-02-04: qty 120, 30d supply, fill #0

## 2023-01-03 MED ORDER — FENTANYL 25 MCG/HR TD PT72
MEDICATED_PATCH | TRANSDERMAL | 0 refills | Status: AC
  Filled 2023-01-06: qty 15, 30d supply, fill #0

## 2023-01-03 MED ORDER — FENTANYL 25 MCG/HR TD PT72
MEDICATED_PATCH | TRANSDERMAL | 0 refills | Status: AC
  Filled 2023-02-04: qty 15, 30d supply, fill #0

## 2023-01-03 MED ORDER — OXYCODONE-ACETAMINOPHEN 5-325 MG PO TABS: ORAL_TABLET | ORAL | 0 refills | Status: AC

## 2023-01-04 ENCOUNTER — Other Ambulatory Visit (HOSPITAL_COMMUNITY): Payer: Self-pay

## 2023-01-06 ENCOUNTER — Other Ambulatory Visit (HOSPITAL_COMMUNITY): Payer: Self-pay

## 2023-01-12 ENCOUNTER — Encounter: Payer: Self-pay | Admitting: Professional Counselor

## 2023-01-12 ENCOUNTER — Ambulatory Visit (INDEPENDENT_AMBULATORY_CARE_PROVIDER_SITE_OTHER): Payer: PPO | Admitting: Professional Counselor

## 2023-01-12 DIAGNOSIS — F331 Major depressive disorder, recurrent, moderate: Secondary | ICD-10-CM

## 2023-01-12 DIAGNOSIS — F411 Generalized anxiety disorder: Secondary | ICD-10-CM | POA: Diagnosis not present

## 2023-01-12 DIAGNOSIS — F900 Attention-deficit hyperactivity disorder, predominantly inattentive type: Secondary | ICD-10-CM
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
## 2023-01-20 ENCOUNTER — Ambulatory Visit (INDEPENDENT_AMBULATORY_CARE_PROVIDER_SITE_OTHER): Payer: PPO | Admitting: Professional Counselor

## 2023-01-20 ENCOUNTER — Ambulatory Visit: Payer: PPO | Admitting: Professional Counselor

## 2023-01-20 ENCOUNTER — Encounter: Payer: Self-pay | Admitting: Professional Counselor

## 2023-01-20 DIAGNOSIS — F411 Generalized anxiety disorder: Secondary | ICD-10-CM

## 2023-01-20 DIAGNOSIS — F900 Attention-deficit hyperactivity disorder, predominantly inattentive type: Secondary | ICD-10-CM | POA: Diagnosis not present

## 2023-01-20 DIAGNOSIS — F331 Major depressive disorder, recurrent, moderate: Secondary | ICD-10-CM | POA: Diagnosis not present

## 2023-01-20 NOTE — Progress Notes (Unsigned)
      [facility] Counselor/Therapist Progress Note  Patient ID: Shelley Vasquez, MRN: [id number],    Date: 01/26/2023  Time Spent: 2:02p - 3:02p   Treatment Type: Family with patient  Reported Symptoms: tearfulness, worries, stress, distractibility, sadness, fatigue  Mental Status Exam:  Appearance:   Neat     Behavior:  Appropriate, Sharing, and Motivated  Motor:  Normal  Speech/Language:   Clear and Coherent and Normal Rate  Affect:  Appropriate and Congruent, tearful  Mood:  sad  Thought process:  normal  Thought content:    WNL  Sensory/Perceptual disturbances:    WNL  Orientation:  oriented to person, place, time/date, and situation  Attention:  Good  Concentration:  Good  Memory:  WNL  Fund of knowledge:   Good  Insight:    Good  Judgment:   Good  Impulse Control:  Good   Risk Assessment: Danger to Self:  No Self-injurious Behavior: No Danger to Others: No Duty to Warn:no Physical Aggression / Violence:No  Access to Firearms a concern: No  Gang Involvement:No   Subjective: Pt presented to session with spouse voicing increased sense of calm since last session, feeling more at ease with her extended family member's situation of concern. Spouse shared his experience of pt symptoms and needs, and counselor conversed with the couple regarding their relationship hx including traumas that have impacted their lives, mainly health. Counselor actively listened, affirmed, and held space fpr processing. Counselor and pt and her spouse discussed issue of concern to pt that she interrupts her spouse often and that she becomes elevated at times. They discussed acceptance levels of stressors on both their parts and pt voiced guilt that she could ever contribute to worsening spouse stress. They discussed pt treatment plan at length, with conversation to be continued next session.   Interventions: Solution-Oriented/Positive Psychology, Humanistic/Existential,  Psycho-education/Bibliotherapy, Insight-Oriented, and Family Systems  Diagnosis:   ICD-10-CM   1. Major depressive disorder, recurrent episode, moderate (HCC)  F33.1     2. Generalized anxiety disorder  F41.1     3. ADHD (attention deficit hyperactivity disorder), inattentive type  F90.0       Plan: Pt is scheduled for a follow up. Continue to discuss treatment plan and to finalize with consent. Continue process work and Conservation officer, historic buildings.   [name], [facility]

## 2023-01-25 DIAGNOSIS — I10 Essential (primary) hypertension: Secondary | ICD-10-CM | POA: Diagnosis not present

## 2023-01-25 DIAGNOSIS — G894 Chronic pain syndrome: Secondary | ICD-10-CM | POA: Diagnosis not present

## 2023-01-25 DIAGNOSIS — F319 Bipolar disorder, unspecified: Secondary | ICD-10-CM | POA: Diagnosis not present

## 2023-01-25 DIAGNOSIS — K2930 Chronic superficial gastritis without bleeding: Secondary | ICD-10-CM | POA: Diagnosis not present

## 2023-01-25 DIAGNOSIS — E785 Hyperlipidemia, unspecified: Secondary | ICD-10-CM | POA: Diagnosis not present

## 2023-01-31 ENCOUNTER — Encounter: Payer: Self-pay | Admitting: Professional Counselor

## 2023-01-31 ENCOUNTER — Ambulatory Visit (INDEPENDENT_AMBULATORY_CARE_PROVIDER_SITE_OTHER): Payer: PPO | Admitting: Professional Counselor

## 2023-01-31 DIAGNOSIS — F331 Major depressive disorder, recurrent, moderate: Secondary | ICD-10-CM | POA: Diagnosis not present

## 2023-01-31 DIAGNOSIS — F411 Generalized anxiety disorder: Secondary | ICD-10-CM

## 2023-01-31 NOTE — Progress Notes (Signed)
      [facility] Counselor/Therapist Progress Note  Patient ID: Shelley Vasquez, MRN: [id number],    Date: 01/31/2023  Time Spent: 11:04a - 12:09p   Treatment Type: Individual Therapy  Reported Symptoms: tearfulness, sadness, grief/loss, worries, restlessness  Mental Status Exam:  Appearance:   Neat     Behavior:  Appropriate and Sharing  Motor:  Normal  Speech/Language:   Clear and Coherent and Normal Rate  Affect:  Appropriate and Congruent  Mood:  normal  Thought process:  normal  Thought content:    WNL  Sensory/Perceptual disturbances:    WNL  Orientation:  oriented to person, place, time/date, and situation  Attention:  Good  Concentration:  Good  Memory:  WNL  Fund of knowledge:   Good  Insight:    Good  Judgment:   Good  Impulse Control:  Good   Risk Assessment: Danger to Self:  No Self-injurious Behavior: No Danger to Others: No Duty to Warn:no Physical Aggression / Violence:No  Access to Firearms a concern: No  Gang Involvement:No   Subjective: Counselor and pt discussed pt treatment plan and pt gave consent. Pt identifed key points of interest to goals. She processed experience of last session with spouse and became tearful as she voiced desire to support him as much as possible, and she processed her grief and loss around impact of his health concerns to his overall well-being, and changes in their relationship such as a difference in banter. Pt voiced desire for greater communication with spouse. Counselor actively listened and affirmed pt, and helped to facilitate insight around pt strengths and tendency to be hard on herself.   Interventions: Solution-Oriented/Positive Psychology, Humanistic/Existential, and Insight-Oriented  Diagnosis:   ICD-10-CM   1. Major depressive disorder, recurrent episode, moderate (HCC)  F33.1     2. Generalized anxiety disorder  F41.1       Plan: Pt is scheduled for a follow-up. Continue process work and Sales executive.   [name], [facility]

## 2023-02-02 ENCOUNTER — Other Ambulatory Visit (HOSPITAL_COMMUNITY): Payer: Self-pay

## 2023-02-03 ENCOUNTER — Other Ambulatory Visit (HOSPITAL_COMMUNITY): Payer: Self-pay

## 2023-02-04 ENCOUNTER — Other Ambulatory Visit (HOSPITAL_COMMUNITY): Payer: Self-pay

## 2023-02-16 ENCOUNTER — Ambulatory Visit (INDEPENDENT_AMBULATORY_CARE_PROVIDER_SITE_OTHER): Payer: PPO | Admitting: Professional Counselor

## 2023-02-16 ENCOUNTER — Encounter: Payer: Self-pay | Admitting: Professional Counselor

## 2023-02-16 DIAGNOSIS — F411 Generalized anxiety disorder: Secondary | ICD-10-CM | POA: Diagnosis not present

## 2023-02-16 DIAGNOSIS — F331 Major depressive disorder, recurrent, moderate: Secondary | ICD-10-CM | POA: Diagnosis not present

## 2023-02-16 NOTE — Progress Notes (Signed)
      [facility] Counselor/Therapist Progress Note  Patient ID: Shelley Vasquez, MRN: [id number],    Date: 02/16/2023  Time Spent: 1:08p - 2:09p  Treatment Type: Family with patient  Husband present with patient   Reported Symptoms:  worries, low mood, tearfulness, frustration, stress, interpersonal concerns   Mental Status Exam:  Appearance:   Neat     Behavior:  Appropriate, Sharing, and Motivated  Motor:  Normal  Speech/Language:   Clear and Coherent and Normal Rate  Affect:  Tearful  Mood:  normal  Thought process:  normal  Thought content:    WNL  Sensory/Perceptual disturbances:    WNL  Orientation:  oriented to person, place, time/date, and situation  Attention:  Good  Concentration:  Good  Memory:  WNL  Fund of knowledge:   Good  Insight:    Good  Judgment:   Good  Impulse Control:  Good   Risk Assessment: Danger to Self:  No Self-injurious Behavior: No Danger to Others: No Duty to Warn:no Physical Aggression / Violence:No  Access to Firearms a concern: No  Gang Involvement:No   Subjective: Pt presented to session with husband and voiced concern for her pattern of response to husband's stressors. Pt identified challenges to avoiding dysregulation on his behalf (protective tendencies) and trying to fix problems instead of listening (per his preference). Pt identified grieving the way things used to be, and identified grieving, too, her own setbacks, and acknowledged an ebb and flow to theses feeling states. Counselor actively listened, affirmed, and utilized DBT intervention regarding radical acceptance, which she and couple discussed at length. Counselor also gave psychoeducation around window of tolerance, which resonated for couple and led to continued reflection. Pt adaptive strengths discussed, and her sense of hopefulness that guides her overarching orientation. They discussed pt coping skills for self soothing, and resourcing these when circumstances such as  ones processed in session arise.    Interventions: Dialectical Behavioral Therapy, Solution-Oriented/Positive Psychology, Humanistic/Existential, Insight-Oriented, and Interpersonal  Diagnosis:   ICD-10-CM   1. Major depressive disorder, recurrent episode, moderate (HCC)  F33.1     2. Generalized anxiety disorder  F41.1       Plan: Pt is scheduled for a follow-up; continue process work and developing coping skills.   [name], [facility]

## 2023-02-21 DIAGNOSIS — Z9013 Acquired absence of bilateral breasts and nipples: Secondary | ICD-10-CM | POA: Diagnosis not present

## 2023-02-28 ENCOUNTER — Other Ambulatory Visit (HOSPITAL_COMMUNITY): Payer: Self-pay

## 2023-02-28 ENCOUNTER — Ambulatory Visit: Payer: PPO | Admitting: Professional Counselor

## 2023-02-28 DIAGNOSIS — M40204 Unspecified kyphosis, thoracic region: Secondary | ICD-10-CM | POA: Diagnosis not present

## 2023-02-28 DIAGNOSIS — M47816 Spondylosis without myelopathy or radiculopathy, lumbar region: Secondary | ICD-10-CM | POA: Diagnosis not present

## 2023-02-28 DIAGNOSIS — Z79891 Long term (current) use of opiate analgesic: Secondary | ICD-10-CM | POA: Diagnosis not present

## 2023-02-28 DIAGNOSIS — G894 Chronic pain syndrome: Secondary | ICD-10-CM | POA: Diagnosis not present

## 2023-02-28 DIAGNOSIS — M25512 Pain in left shoulder: Secondary | ICD-10-CM | POA: Diagnosis not present

## 2023-02-28 MED ORDER — FENTANYL 25 MCG/HR TD PT72
1.0000 | MEDICATED_PATCH | TRANSDERMAL | 0 refills | Status: AC
  Filled 2023-04-03: qty 15, 30d supply, fill #0

## 2023-02-28 MED ORDER — OXYCODONE-ACETAMINOPHEN 5-325 MG PO TABS
1.0000 | ORAL_TABLET | Freq: Four times a day (QID) | ORAL | 0 refills | Status: AC | PRN
  Filled 2023-04-03: qty 120, 30d supply, fill #0

## 2023-02-28 MED ORDER — OXYCODONE-ACETAMINOPHEN 5-325 MG PO TABS
1.0000 | ORAL_TABLET | Freq: Four times a day (QID) | ORAL | 0 refills | Status: AC | PRN
  Filled 2023-03-04: qty 120, 30d supply, fill #0
  Filled 2023-03-06: qty 90, 22d supply, fill #0
  Filled 2023-03-06: qty 30, 8d supply, fill #0

## 2023-02-28 MED ORDER — FENTANYL 25 MCG/HR TD PT72
1.0000 | MEDICATED_PATCH | TRANSDERMAL | 0 refills | Status: AC
  Filled 2023-03-04 – 2023-03-06 (×2): qty 15, 30d supply, fill #0

## 2023-03-01 ENCOUNTER — Other Ambulatory Visit (HOSPITAL_COMMUNITY): Payer: Self-pay

## 2023-03-01 ENCOUNTER — Ambulatory Visit (INDEPENDENT_AMBULATORY_CARE_PROVIDER_SITE_OTHER): Payer: PPO | Admitting: Professional Counselor

## 2023-03-01 ENCOUNTER — Encounter: Payer: Self-pay | Admitting: Professional Counselor

## 2023-03-01 DIAGNOSIS — F411 Generalized anxiety disorder: Secondary | ICD-10-CM | POA: Diagnosis not present

## 2023-03-01 DIAGNOSIS — F331 Major depressive disorder, recurrent, moderate: Secondary | ICD-10-CM
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
## 2023-03-03 ENCOUNTER — Other Ambulatory Visit (HOSPITAL_COMMUNITY): Payer: Self-pay

## 2023-03-04 ENCOUNTER — Emergency Department (HOSPITAL_BASED_OUTPATIENT_CLINIC_OR_DEPARTMENT_OTHER)
Admission: EM | Admit: 2023-03-04 | Discharge: 2023-03-04 | Disposition: A | Discharge status: Home / Self Care | Payer: PPO | Attending: Emergency Medicine | Admitting: Emergency Medicine

## 2023-03-04 ENCOUNTER — Other Ambulatory Visit (HOSPITAL_COMMUNITY): Payer: Self-pay

## 2023-03-04 ENCOUNTER — Other Ambulatory Visit: Payer: Self-pay

## 2023-03-04 DIAGNOSIS — I10 Essential (primary) hypertension: Secondary | ICD-10-CM | POA: Insufficient documentation

## 2023-03-04 DIAGNOSIS — R519 Headache, unspecified: Secondary | ICD-10-CM

## 2023-03-04 LAB — CBC
HCT: 42.7 % (ref 36.0–46.0)
Hemoglobin: 14.9 g/dL (ref 12.0–15.0)
MCH: 31.8 pg (ref 26.0–34.0)
MCHC: 34.9 g/dL (ref 30.0–36.0)
MCV: 91 fL (ref 80.0–100.0)
Platelets: 298 10*3/uL (ref 150–400)
RBC: 4.69 MIL/uL (ref 3.87–5.11)
RDW: 12.6 % (ref 11.5–15.5)
WBC: 8.4 10*3/uL (ref 4.0–10.5)
nRBC: 0 % (ref 0.0–0.2)

## 2023-03-04 LAB — COMPREHENSIVE METABOLIC PANEL
ALT: 16 U/L (ref 0–44)
AST: 23 U/L (ref 15–41)
Albumin: 5.2 g/dL — ABNORMAL HIGH (ref 3.5–5.0)
Alkaline Phosphatase: 57 U/L (ref 38–126)
Anion gap: 15 (ref 5–15)
BUN: 12 mg/dL (ref 6–20)
CO2: 26 mmol/L (ref 22–32)
Calcium: 10.2 mg/dL (ref 8.9–10.3)
Chloride: 100 mmol/L (ref 98–111)
Creatinine, Ser: 0.82 mg/dL (ref 0.44–1.00)
GFR, Estimated: >60 mL/min (ref >60)
Glucose, Bld: 140 mg/dL — ABNORMAL HIGH (ref 70–99)
Potassium: 3.3 mmol/L — ABNORMAL LOW (ref 3.5–5.1)
Sodium: 141 mmol/L (ref 135–145)
Total Bilirubin: 0.6 mg/dL (ref 0.3–1.2)
Total Protein: 8.5 g/dL — ABNORMAL HIGH (ref 6.5–8.1)

## 2023-03-04 LAB — LIPASE, BLOOD: Lipase: 26 U/L (ref 11–51)

## 2023-03-04 MED ORDER — DIPHENHYDRAMINE HCL 50 MG/ML IJ SOLN
25.0000 mg | Freq: Once | INTRAMUSCULAR | Status: AC
  Administered 2023-03-04: 25 mg via INTRAVENOUS
  Filled 2023-03-04: qty 1

## 2023-03-04 MED ORDER — METOCLOPRAMIDE HCL 5 MG/ML IJ SOLN
10.0000 mg | Freq: Once | INTRAMUSCULAR | Status: AC
  Administered 2023-03-04: 10 mg via INTRAVENOUS
  Filled 2023-03-04: qty 2

## 2023-03-04 MED ORDER — SODIUM CHLORIDE 0.9 % IV BOLUS
1000.0000 mL | Freq: Once | INTRAVENOUS | Status: AC
  Administered 2023-03-04: 1000 mL via INTRAVENOUS

## 2023-03-04 MED ORDER — MORPHINE SULFATE (PF) 4 MG/ML IV SOLN
4.0000 mg | Freq: Once | INTRAVENOUS | Status: AC
  Administered 2023-03-04: 4 mg via INTRAVENOUS
  Filled 2023-03-04: qty 1

## 2023-03-04 NOTE — ED Notes (Signed)
DC papers reviewed. No questions or concerns. No signs of distress. Pt assisted to wheelchair and out to lobby. Appropriate measures for safety taken.

## 2023-03-04 NOTE — ED Triage Notes (Signed)
Patient arrives with complaints of increased fatigue most of the week . Patient reports migraine headache and nausea as well. Concerned about onset of vomiting that occurred this morning.

## 2023-03-04 NOTE — ED Provider Notes (Signed)
[place] [facility]GENCY DEPARTMENT AT [facility] Provider Note   CSN: [id number] Arrival date & time: 03/04/23  1705     History  Chief Complaint  Patient presents with   Emesis   Fatigue    Shelley Vasquez is a 57 y.o. female history of chronic pain, migraines, here presenting with vomiting and headaches.  Patient states that she has not been feeling well for the last week.  She states that she is taking care of mother who just got out of the hospital.  She states that she is unable to keep down her pain medicine and blood pressure medicine this morning.  She tried to take Imitrex with minimal relief.  Patient states that she is under pain management and gets fentanyl and oxycodone at baseline  The history is provided by the patient.       Home Medications Prior to Admission medications   Medication Sig Start Date End Date Taking? Authorizing Provider  Baclofen 5 MG TABS Take 1 tablet (5 mg total) by mouth at bedtime as needed. Stop Tizanidine. 09/12/22     betamethasone valerate (VALISONE) 0.1 % cream Apply 1 application topically See admin instructions. Up to twice a week as needed for vaginal dryness.    [provider]  clonazePAM (KLONOPIN) 1 MG tablet Take 1 mg by mouth See admin instructions. Take 1 tablet (1 mg) by mouth midday if needed for anxiety & take up 2 tablets (2 mg) by mouth at bedtime as needed for sleep.    [provider]  docusate sodium (COLACE) 100 MG capsule Take 400 mg by mouth 2 (two) times daily.    [provider]  fentaNYL (DURAGESIC - DOSED MCG/HR) 25 MCG/HR patch Place 25 mcg onto the skin every other day.    [provider]  fentaNYL (DURAGESIC) 25 MCG/HR Place 1 patch onto the skin every other day as needed (fill 08/19/22) 08/19/22     fentaNYL (DURAGESIC) 25 MCG/HR Place 1 patch onto the skin every other day (every 48 hours) 07/21/22     fentaNYL (DURAGESIC) 25 MCG/HR Place 1 patch on skin every 48 hours. 09/16/22      fentaNYL (DURAGESIC) 25 MCG/HR Place 1 patch to skin every 48 hours. 11/07/22     fentaNYL (DURAGESIC) 25 MCG/HR Place 1 patch to skin every 48 hours 11/07/22     fentaNYL (DURAGESIC) 25 MCG/HR Apply 1 patch to skin every 48 hours. 02/04/23     fentaNYL (DURAGESIC) 25 MCG/HR Apply 1 patch to skin every 48 hours please 01/06/23 01/06/23     fentaNYL (DURAGESIC) 25 MCG/HR Place 1 patch onto the skin every other day. 04/01/23     fentaNYL (DURAGESIC) 25 MCG/HR Place 1 patch onto the skin every other day. 03/04/23     guanFACINE (INTUNIV) 4 MG TB24 ER tablet Take 4 mg by mouth daily.    [provider]  hyoscyamine (LEVSIN SL) 0.125 MG SL tablet Place 0.125 mg under the tongue every 4 (four) hours as needed for cramping (difficulty swallowing.).     [provider]  lamoTRIgine (LAMICTAL) 200 MG tablet Take 400 mg by mouth at bedtime.    [provider]  Magnesium 250 MG TABS Take 250-500 mg by mouth 2 (two) times daily as needed (constipation).     [provider]  MAGNESIUM CITRATE PO Take 1 spray by mouth 3 (three) times daily as needed (for muscle cramps).    [provider]  ondansetron (ZOFRAN-ODT)  8 MG disintegrating tablet Take 8 mg by mouth every 8 (eight) hours as needed for nausea or vomiting.    [provider]  OVER THE COUNTER MEDICATION Take 0.5 tablets by mouth 2 (two) times daily. Avocado SoyBean    [provider]  oxyCODONE-acetaminophen (PERCOCET/ROXICET) 5-325 MG tablet Take 1 tablet by mouth 4 (four) times daily.    [provider]  oxyCODONE-acetaminophen (PERCOCET/ROXICET) 5-325 MG tablet Take 1 tablet by mouth every 6 (six) hours as needed for pain (fill 08/15/22) 08/15/22     oxyCODONE-acetaminophen (PERCOCET/ROXICET) 5-325 MG tablet Take 1 tablet by mouth every 6 (six) hours as needed for pain (fill 09/13/22) 09/13/22     oxyCODONE-acetaminophen (PERCOCET/ROXICET) 5-325 MG tablet Take 1 tablet by mouth every 6  (six) hours as needed for pain 10/12/22     oxyCODONE-acetaminophen (PERCOCET/ROXICET) 5-325 MG tablet Take 1 tablet by mouth every 6 (six) hours as needed for pain. 09/13/22     oxyCODONE-acetaminophen (PERCOCET/ROXICET) 5-325 MG tablet Take 1 tablet by mouth every 6 hours as needed for pain 12/08/22     oxyCODONE-acetaminophen (PERCOCET/ROXICET) 5-325 MG tablet Take 1 tablet by mouth every six hours as needed for pain 11/09/22     oxyCODONE-acetaminophen (PERCOCET/ROXICET) 5-325 MG tablet Take 1 tablet by mouth every 6 hours as needed for pain. 02/04/23     oxyCODONE-acetaminophen (PERCOCET/ROXICET) 5-325 MG tablet Take 1 tablet by mouth every 6 hours as needed for pain mallinkrodt generic if available 01/06/23 01/06/23     oxyCODONE-acetaminophen (PERCOCET/ROXICET) 5-325 MG tablet Take 1 tablet by mouth every 6 (six) hours as needed for pain. 03/04/23     oxyCODONE-acetaminophen (PERCOCET/ROXICET) 5-325 MG tablet Take 1 tablet by mouth every 6 (six) hours as needed for pain. 04/01/23     pantoprazole (PROTONIX) 40 MG tablet Take 40 mg by mouth 2 (two) times daily.    [provider]  rizatriptan (MAXALT) 10 MG tablet Take 10 mg by mouth as needed for migraine. May repeat in 2 hours if needed    [provider]  tiZANidine (ZANAFLEX) 4 MG tablet Take 4-8 mg by mouth See admin instructions. Take 1 tablet (4 mg) by mouth in the morning & take 3 tablets by mouth at night.    [provider]  traZODone (DESYREL) 50 MG tablet Take 100 mg by mouth at bedtime.     [provider]  triamcinolone cream (KENALOG) 0.5 % Apply 1 application topically 2 (two) times daily as needed (for dry/cracking skin on hands.).     [provider]  triamterene-hydrochlorothiazide (MAXZIDE-25) 37.5-25 MG tablet Take 0.5 tablets by mouth daily.    [provider]  VIIBRYD 20 MG TABS Take 1 tablet by mouth daily. 04/17/19   [provider]      Allergies    Nsaids,  Prednisone, Prozac [fluoxetine hcl], Amoxicillin-pot clavulanate, Anesthesia s-i-40 [propofol], Lithium, Wellbutrin [bupropion], Dilaudid [hydromorphone hcl], Escitalopram oxalate, and Zolpidem tartrate    Review of Systems   Review of Systems  Gastrointestinal:  Positive for vomiting.  Neurological:  Positive for headaches.  All other systems reviewed and are negative.   Physical Exam Updated Vital Signs BP (!) 178/105   Pulse 78   Temp 98.9 F (37.2 C)   Resp 18   Ht 5\' 6"  (1.676 m)   Wt 74.8 kg   LMP 10/27/2010   SpO2 96%   BMI 26.63 kg/m  Physical Exam Vitals and nursing note reviewed.  Constitutional:  Comments: Uncomfortable   HENT:     Head: Normocephalic.     Nose: Nose normal.     Mouth/Throat:     Mouth: Mucous membranes are moist.  Eyes:     Extraocular Movements: Extraocular movements intact.     Pupils: Pupils are equal, round, and reactive to light.  Cardiovascular:     Rate and Rhythm: Normal rate and regular rhythm.     Pulses: Normal pulses.     Heart sounds: Normal heart sounds.  Pulmonary:     Effort: Pulmonary effort is normal.     Breath sounds: Normal breath sounds.  Abdominal:     General: Abdomen is flat.     Palpations: Abdomen is soft.  Musculoskeletal:        General: Normal range of motion.     Cervical back: Normal range of motion and neck supple.  Skin:    General: Skin is warm.     Capillary Refill: Capillary refill takes less than 2 seconds.  Neurological:     General: No focal deficit present.     Mental Status: She is oriented to person, place, and time.  Psychiatric:        Mood and Affect: Mood normal.        Behavior: Behavior normal.     ED Results / Procedures / Treatments   Labs (all labs ordered are listed, but only abnormal results are displayed) Labs Reviewed  COMPREHENSIVE METABOLIC PANEL - Abnormal; Notable for the following components:      Result Value   Potassium 3.3 (*)    Glucose, Bld 140 (*)     Total Protein 8.5 (*)    Albumin 5.2 (*)    All other components within normal limits  LIPASE, BLOOD  CBC    EKG None  Radiology No results found.  Procedures Procedures    Medications Ordered in ED Medications  metoCLOPramide (REGLAN) injection 10 mg (10 mg Intravenous Given 03/04/23 1836)  diphenhydrAMINE (BENADRYL) injection 25 mg (25 mg Intravenous Given 03/04/23 1834)  sodium chloride 0.9 % bolus 1,000 mL (1,000 mLs Intravenous New Bag/Given 03/04/23 1833)  morphine (PF) 4 MG/ML injection 4 mg (4 mg Intravenous Given 03/04/23 1835)    ED Course/ Medical Decision Making/ A&P                                 Medical Decision Making Shelley Vasquez is a 57 y.o. female here presenting with headache and vomiting.  She has a history of migraines and has chronic pain.  I think likely worsening migraines.  Plan to check basic labs and give migraine cocktail and pain medicine and reassess  7:29 PM I reviewed patient's labs and they were unremarkable.  I reassessed patient and she is feeling better now.  She has pain medicine at home and BP meds at home.  I told her to follow-up with her doctor as well.   Problems Addressed: Hypertension, unspecified type: chronic illness or injury Nonintractable headache, unspecified chronicity pattern, unspecified headache type: chronic illness or injury with exacerbation, progression, or side effects of treatment  Amount and/or Complexity of Data Reviewed Labs: ordered. Decision-making details documented in ED Course.  Risk Prescription drug management.    Final Clinical Impression(s) / ED Diagnoses Final diagnoses:  None    Rx / DC Orders ED Discharge Orders     None  Charlynne Pander, MD 03/04/23 [phone]

## 2023-03-04 NOTE — Discharge Instructions (Signed)
Please take your medicines as prescribed by your doctor.  This includes your pain medicine and your medicines for migraines.  Stay hydrated  See your doctor for follow-up  Return to ER if you have worse headache or vomiting or dehydration

## 2023-03-05 ENCOUNTER — Other Ambulatory Visit (HOSPITAL_COMMUNITY): Payer: Self-pay

## 2023-03-06 ENCOUNTER — Other Ambulatory Visit (HOSPITAL_COMMUNITY): Payer: Self-pay

## 2023-03-06 ENCOUNTER — Other Ambulatory Visit: Payer: Self-pay

## 2023-03-07 ENCOUNTER — Other Ambulatory Visit (HOSPITAL_COMMUNITY): Payer: Self-pay

## 2023-03-07 ENCOUNTER — Ambulatory Visit: Payer: PPO | Admitting: Professional Counselor

## 2023-03-13 ENCOUNTER — Other Ambulatory Visit: Payer: Self-pay | Admitting: Family Medicine

## 2023-03-13 DIAGNOSIS — Z1231 Encounter for screening mammogram for malignant neoplasm of breast: Secondary | ICD-10-CM

## 2023-03-14 ENCOUNTER — Encounter: Payer: Self-pay | Admitting: Professional Counselor

## 2023-03-14 ENCOUNTER — Ambulatory Visit (INDEPENDENT_AMBULATORY_CARE_PROVIDER_SITE_OTHER): Payer: PPO | Admitting: Professional Counselor

## 2023-03-14 DIAGNOSIS — F411 Generalized anxiety disorder: Secondary | ICD-10-CM | POA: Diagnosis not present

## 2023-03-14 DIAGNOSIS — F331 Major depressive disorder, recurrent, moderate: Secondary | ICD-10-CM
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
## 2023-03-21 ENCOUNTER — Encounter: Payer: Self-pay | Admitting: Professional Counselor

## 2023-03-21 ENCOUNTER — Ambulatory Visit: Payer: PPO | Admitting: Professional Counselor

## 2023-03-21 DIAGNOSIS — F330 Major depressive disorder, recurrent, mild: Secondary | ICD-10-CM

## 2023-03-21 DIAGNOSIS — F411 Generalized anxiety disorder: Secondary | ICD-10-CM
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
## 2023-03-27 ENCOUNTER — Other Ambulatory Visit (HOSPITAL_COMMUNITY): Payer: Self-pay

## 2023-03-28 ENCOUNTER — Ambulatory Visit: Payer: PPO | Admitting: Professional Counselor

## 2023-03-28 ENCOUNTER — Encounter: Payer: Self-pay | Admitting: Professional Counselor

## 2023-03-28 DIAGNOSIS — F331 Major depressive disorder, recurrent, moderate: Secondary | ICD-10-CM | POA: Diagnosis not present

## 2023-03-28 DIAGNOSIS — F411 Generalized anxiety disorder: Secondary | ICD-10-CM
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
## 2023-03-31 ENCOUNTER — Other Ambulatory Visit (HOSPITAL_COMMUNITY): Payer: Self-pay

## 2023-04-03 ENCOUNTER — Other Ambulatory Visit: Payer: Self-pay

## 2023-04-03 ENCOUNTER — Other Ambulatory Visit (HOSPITAL_COMMUNITY): Payer: Self-pay

## 2023-04-04 ENCOUNTER — Other Ambulatory Visit (HOSPITAL_COMMUNITY): Payer: Self-pay

## 2023-04-04 ENCOUNTER — Encounter: Payer: Self-pay | Admitting: Professional Counselor

## 2023-04-04 ENCOUNTER — Ambulatory Visit (INDEPENDENT_AMBULATORY_CARE_PROVIDER_SITE_OTHER): Payer: PPO | Admitting: Professional Counselor

## 2023-04-04 DIAGNOSIS — F411 Generalized anxiety disorder: Secondary | ICD-10-CM

## 2023-04-04 DIAGNOSIS — F331 Major depressive disorder, recurrent, moderate: Secondary | ICD-10-CM
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
## 2023-04-07 ENCOUNTER — Ambulatory Visit
Admission: RE | Admit: 2023-04-07 | Discharge: 2023-04-07 | Disposition: A | Discharge status: Home / Self Care | Payer: PPO | Source: Ambulatory Visit | Attending: Family Medicine | Admitting: Family Medicine

## 2023-04-07 DIAGNOSIS — Z1231 Encounter for screening mammogram for malignant neoplasm of breast: Secondary | ICD-10-CM | POA: Diagnosis not present

## 2023-04-11 ENCOUNTER — Encounter: Payer: Self-pay | Admitting: Professional Counselor

## 2023-04-11 ENCOUNTER — Ambulatory Visit (INDEPENDENT_AMBULATORY_CARE_PROVIDER_SITE_OTHER): Payer: PPO | Admitting: Professional Counselor

## 2023-04-11 DIAGNOSIS — F411 Generalized anxiety disorder: Secondary | ICD-10-CM

## 2023-04-11 DIAGNOSIS — F331 Major depressive disorder, recurrent, moderate: Secondary | ICD-10-CM

## 2023-04-11 NOTE — Progress Notes (Signed)
      [facility] Counselor/Therapist Progress Note  Patient ID: Shelley Vasquez, MRN: [id number],    Date: 04/11/2023  Time Spent: 1:06p - 2:12p   Treatment Type: Individual Therapy  Reported Symptoms: tearfulness, grief/loss, sadness, worries, interpersonal concerns  Mental Status Exam:  Appearance:   Casual     Behavior:  Appropriate, Sharing, and Motivated  Motor:  Normal  Speech/Language:   Clear and Coherent and Normal Rate  Affect:  Tearful  Mood:  Sad  Thought process:  normal  Thought content:    WNL  Sensory/Perceptual disturbances:    WNL  Orientation:  Sound  Attention:  Good  Concentration:  Good  Memory:  WNL  Fund of knowledge:   Good  Insight:    Good  Judgment:   Good  Impulse Control:  Good   Risk Assessment: Danger to Self:  No Self-injurious Behavior: No Danger to Others: No Duty to Warn:no Physical Aggression / Violence:No  Access to Firearms a concern: No  Gang Involvement:No   Subjective: Pt presented to session with symptoms of depression and anxiety as relate experience of grief and loss by hx. She and counselor addressed goals of alleviating symptoms and developing coping skills. Pt reflected on her father's mental and physical health by hx, and how his symptomology impacted family. Pt processed specific trauma memories and voiced opportunity to grieve openly as therapeutic. She identified range of feelings including her suppressed anger, and sorrow regarding beloved qualities about him that she mourned were starkly contrasted in his passing. Counselor assisted pt with strategies for supporting husband that nurture while also allow for her creative expression.   Interventions: Solution-Oriented/Positive Psychology, Humanistic/Existential, Grief Therapy, and Insight-Oriented  Diagnosis:   ICD-10-CM   1. Major depressive disorder, recurrent episode, moderate (HCC)  F33.1     2. Generalized anxiety disorder  F41.1       Plan: Pt is scheduled  for a follow-up; continue process work and Engineer, drilling. Pt to practice nurturance in effort to temper stressful circumstance.  [name], [facility]

## 2023-04-20 ENCOUNTER — Ambulatory Visit (INDEPENDENT_AMBULATORY_CARE_PROVIDER_SITE_OTHER): Payer: PPO | Admitting: Professional Counselor

## 2023-04-20 ENCOUNTER — Encounter: Payer: Self-pay | Admitting: Professional Counselor

## 2023-04-20 DIAGNOSIS — F411 Generalized anxiety disorder: Secondary | ICD-10-CM | POA: Diagnosis not present

## 2023-04-20 DIAGNOSIS — F331 Major depressive disorder, recurrent, moderate: Secondary | ICD-10-CM

## 2023-04-20 NOTE — Progress Notes (Signed)
      [facility] Counselor/Therapist Progress Note  Patient ID: Shelley Vasquez, MRN: [id number],    Date: 04/20/2023  Time Spent:  1:10p - 2:10p  Treatment Type: Individual Therapy  Reported Symptoms: tearfulness, grief/loss, low mood, worries  Mental Status Exam:  Appearance:   Neat     Behavior:  Appropriate, Sharing, and Motivated  Motor:  Normal  Speech/Language:   Clear and Coherent and Normal Rate  Affect:  Tearful  Mood:  normal  Thought process:  normal  Thought content:    WNL  Sensory/Perceptual disturbances:    WNL  Orientation:  Sound  Attention:  Good  Concentration:  Good  Memory:  WNL  Fund of knowledge:   Good  Insight:    Good  Judgment:   Good  Impulse Control:  Good   Risk Assessment: Danger to Self:  No Self-injurious Behavior: No Danger to Others: No Duty to Warn:no Physical Aggression / Violence:No  Access to Firearms a concern: No  Gang Involvement:No   Subjective: Pt presented to session voicing progress with goal to respond to spouse's needs by giving and adopting a supportive stance, and to resist the urge to fix frustrating circumstances or to respond in a way that heightens frustrations. She reported cooking more for him as discussed as an alternate gesture, and she voiced spouse being appreciative which brings her joy. Counselor provided psychoeducation around ambiguous loss where her experience of her spouse's health conditions and impact are concerned. Pt reported reflecting considerably on last session in which she processed relations with her father and feelings of grief and loss; counselor affirmed and actively listened. Counselor and pt discussed how she is leaning into mindset of radical acceptance per DBT as discussed in prior sessions, and how this is helpful in her reworking of her thought patterns and therefore sometimes overwhelming feelings. Counselor encouraged pt to think of acceptance as a gift and a way to prioritize rest and  slow pace to life.   Interventions: Solution-Oriented/Positive Psychology, Humanistic/Existential, Grief Therapy, and Insight-Oriented, DBT  Diagnosis:   ICD-10-CM   1. Major depressive disorder, recurrent episode, moderate (HCC)  F33.1     2. Generalized anxiety disorder  F41.1       Plan: Pt is scheduled for a follow-up; continue process work and developing coping skills. Pt to continue practices of nurture and listening, and resisting fixing mentality; continue being mindful of practicing radical acceptance.    [name], [facility]

## 2023-04-25 ENCOUNTER — Ambulatory Visit: Payer: PPO | Admitting: Professional Counselor

## 2023-04-25 ENCOUNTER — Other Ambulatory Visit (HOSPITAL_COMMUNITY): Payer: Self-pay

## 2023-04-25 DIAGNOSIS — G894 Chronic pain syndrome: Secondary | ICD-10-CM | POA: Diagnosis not present

## 2023-04-25 DIAGNOSIS — M40204 Unspecified kyphosis, thoracic region: Secondary | ICD-10-CM | POA: Diagnosis not present

## 2023-04-25 DIAGNOSIS — M47816 Spondylosis without myelopathy or radiculopathy, lumbar region: Secondary | ICD-10-CM | POA: Diagnosis not present

## 2023-04-25 DIAGNOSIS — M25512 Pain in left shoulder: Secondary | ICD-10-CM | POA: Diagnosis not present

## 2023-04-25 MED ORDER — OXYCODONE-ACETAMINOPHEN 5-325 MG PO TABS
1.0000 | ORAL_TABLET | Freq: Four times a day (QID) | ORAL | 0 refills | Status: AC | PRN
Start: 2023-05-03 — End: ?
  Filled 2023-05-03: qty 120, 30d supply, fill #0

## 2023-04-25 MED ORDER — OXYCODONE-ACETAMINOPHEN 5-325 MG PO TABS
1.0000 | ORAL_TABLET | Freq: Four times a day (QID) | ORAL | 0 refills | Status: AC | PRN
Start: 2023-05-31 — End: ?
  Filled 2023-05-31: qty 120, 30d supply, fill #0

## 2023-04-25 MED ORDER — FENTANYL 25 MCG/HR TD PT72
1.0000 | MEDICATED_PATCH | TRANSDERMAL | 0 refills | Status: AC
Start: 2023-05-31 — End: ?
  Filled 2023-05-31: qty 15, 30d supply, fill #0

## 2023-04-25 MED ORDER — FENTANYL 25 MCG/HR TD PT72
1.0000 | MEDICATED_PATCH | TRANSDERMAL | 0 refills | Status: AC
Start: 2023-05-03 — End: ?
  Filled 2023-05-03: qty 15, 30d supply, fill #0

## 2023-04-27 ENCOUNTER — Ambulatory Visit: Payer: PPO | Admitting: Professional Counselor

## 2023-05-01 ENCOUNTER — Other Ambulatory Visit (HOSPITAL_COMMUNITY): Payer: Self-pay

## 2023-05-03 ENCOUNTER — Other Ambulatory Visit (HOSPITAL_COMMUNITY): Payer: Self-pay

## 2023-05-09 ENCOUNTER — Ambulatory Visit: Payer: PPO | Admitting: Professional Counselor

## 2023-05-09 ENCOUNTER — Encounter: Payer: Self-pay | Admitting: Professional Counselor

## 2023-05-09 DIAGNOSIS — F411 Generalized anxiety disorder: Secondary | ICD-10-CM

## 2023-05-09 DIAGNOSIS — F330 Major depressive disorder, recurrent, mild: Secondary | ICD-10-CM

## 2023-05-09 NOTE — Progress Notes (Signed)
      [facility] Counselor/Therapist Progress Note  Patient ID: Shelley Vasquez, MRN: [id number],    Date: 05/09/2023  Time Spent: 1:06p - 2:07p  Treatment Type: Individual Therapy  Reported Symptoms: tearfulness, low self esteem, body image concerns, sadness, grief/loss  Mental Status Exam:  Appearance:   Neat     Behavior:  Appropriate, Sharing, and Motivated  Motor:  Normal  Speech/Language:   Clear and Coherent and Normal Rate  Affect:  Tearful  Mood:  normal  Thought process:  normal  Thought content:    WNL  Sensory/Perceptual disturbances:    WNL  Orientation:  Sound  Attention:  Good  Concentration:  Good  Memory:  WNL  Fund of knowledge:   Good  Insight:    Good  Judgment:   Good  Impulse Control:  Good   Risk Assessment: Danger to Self:  No Self-injurious Behavior: No Danger to Others: No Duty to Warn:no Physical Aggression / Violence:No  Access to Firearms a concern: No  Gang Involvement:No   Subjective: Pt presented to session to address concerns of depression and anxiety, and she reported progress while also depressive activation around grief and loss anniversary at this time. Pt reported progress with ST[facility] of practicing restraint and supportive measures for husband in his stressful circumstance at home, and shared regarding positive outcome since last session. She voiced anniversary of her father's death as bringing up emotional pain and reflection for her, and she identified impact on family as well. Counselor shared grief exercise for closure and for grief growth and healing with pt, and pt voiced interventions as helpful. Additionally pt processed experience of weight changes over time, and shared that while she has lost weight, she has preoccupying thoughts about accepting her new weight, as in feeling unable to embrace it or believe that it will last. Pt to reflect further on this experience and discuss next session.  Interventions:  Solution-Oriented/Positive Psychology, Humanistic/Existential, and Insight-Oriented, Grief Therapy  Diagnosis:   ICD-10-CM   1. Major depressive disorder, recurrent episode, mild (HCC)  F33.0     2. Generalized anxiety disorder  F41.1       Plan: Pt is scheduled for a follow-up; continue process work and developing coping skills.   [name], [facility]

## 2023-05-16 ENCOUNTER — Ambulatory Visit: Payer: PPO | Admitting: Professional Counselor

## 2023-05-16 ENCOUNTER — Encounter: Payer: Self-pay | Admitting: Professional Counselor

## 2023-05-16 DIAGNOSIS — F411 Generalized anxiety disorder: Secondary | ICD-10-CM | POA: Diagnosis not present

## 2023-05-16 DIAGNOSIS — F330 Major depressive disorder, recurrent, mild: Secondary | ICD-10-CM | POA: Diagnosis not present

## 2023-05-16 NOTE — Progress Notes (Signed)
      [facility] Counselor/Therapist Progress Note  Patient ID: Shelley Vasquez, MRN: [id number],    Date: 05/16/2023  Time Spent: 1:07 PM to 2:15 PM  Treatment Type: Individual Therapy  Reported Symptoms: Grief/loss, preoccupations, worries, stress, interpersonal concerns  Mental Status Exam:  Appearance:   Neat     Behavior:  Appropriate, Sharing, and Motivated  Motor:  Normal  Speech/Language:   Clear and Coherent and Normal Rate  Affect:  Appropriate and Congruent  Mood:  normal  Thought process:  normal  Thought content:    WNL  Sensory/Perceptual disturbances:    WNL  Orientation:  Sound  Attention:  Good  Concentration:  Good  Memory:  WNL  Fund of knowledge:   Good  Insight:    Good  Judgment:   Good  Impulse Control:  Good   Risk Assessment: Danger to Self:  No Self-injurious Behavior: No Danger to Others: No Duty to Warn:no Physical Aggression / Violence:No  Access to Firearms a concern: No  Gang Involvement:No   Subjective: Patient presented to session to address concerns of depression and anxiety.  She reported progress at this time.  Patient voiced concerns regarding saving tendencies and difficulty letting go, particularly with clothing due to issue discussed last session regarding patient clothing size changes, and reluctance in believing that she might stay at the size she is presently in.  Counselor assisted patient in facilitating insight, and they discussed methods for sorting, letting go of unwanted items with the protocol from therapeutic workbook.  Counselor made photocopies of psychoeducation and worksheets per patient request.  Patient identified short-term goal between sessions of reflecting on information and doing worksheets.  Patient processed her grief on anniversary of father's death; counselor held space, actively listened, and affirmed and normalized patient feelings and experience.  Interventions: Solution-Oriented/Positive Psychology,  Humanistic/Existential, and Insight-Oriented, Psychoeducation  Diagnosis:   ICD-10-CM   1. Major depressive disorder, recurrent episode, mild (HCC)  F33.0     2. Generalized anxiety disorder  F41.1       Plan: Patient is scheduled for follow-up; continue process work and developing coping skills.  Patient to address short-term goal as identified above, to reflect and to work on resources provided.  Progress note dictated via Dragon and checked for accuracy.  [name], [facility]

## 2023-05-23 ENCOUNTER — Encounter: Payer: Self-pay | Admitting: Professional Counselor

## 2023-05-23 ENCOUNTER — Ambulatory Visit (INDEPENDENT_AMBULATORY_CARE_PROVIDER_SITE_OTHER): Payer: PPO | Admitting: Professional Counselor

## 2023-05-23 DIAGNOSIS — F330 Major depressive disorder, recurrent, mild: Secondary | ICD-10-CM

## 2023-05-23 DIAGNOSIS — F411 Generalized anxiety disorder: Secondary | ICD-10-CM | POA: Diagnosis not present

## 2023-05-23 NOTE — Progress Notes (Signed)
      [facility] Counselor/Therapist Progress Note  Patient ID: Shelley Vasquez, MRN: [id number],    Date: 05/23/2023  Time Spent: 1:06 PM to 2:08 PM  Treatment Type: Individual Therapy  Reported Symptoms: worries, intermittent sadness, stress, grief/loss, excess sleep  Mental Status Exam:  Appearance:   Neat     Behavior:  Appropriate, Sharing, and Motivated  Motor:  Normal  Speech/Language:   Clear and Coherent and Normal Rate  Affect:  Appropriate and Congruent  Mood:  normal  Thought process:  normal  Thought content:    WNL  Sensory/Perceptual disturbances:    WNL  Orientation:  Sound  Attention:  Good  Concentration:  Good  Memory:  WNL  Fund of knowledge:   Good  Insight:    Good  Judgment:   Good  Impulse Control:  Good   Risk Assessment: Danger to Self:  No Self-injurious Behavior: No Danger to Others: No Duty to Warn:no Physical Aggression / Violence:No  Access to Firearms a concern: No  Gang Involvement:No   Subjective: Patient presented to session to address concerns of depression and anxiety.  She voiced progress, and therapy to be helpful in alleviating her symptoms.  Patient reflected on her overall sense of increased optimism, however continued bouts of excessive sleep. She reported that she was working on her plan per the resources counselor gave her last time, and addressing her desire to downsize belongings.  Patient voiced the valuing of her time as a concept particularly helpful in her motivations, and to have increased awareness and reflection around the concept since last session.  Patient processed the experience of the family gathering focused around grieving for her father at the anniversary of his passing, and for this to have been a positive experience for her and family.  She voiced an experience of solemnity but peace also.  Patient identified ongoing short-term goal of working on plan for cleaning out per resources from last  session.  Interventions: Solution-Oriented/Positive Psychology, Humanistic/Existential, and Insight-Oriented  Diagnosis:   ICD-10-CM   1. Major depressive disorder, recurrent episode, mild (HCC)  F33.0     2. Generalized anxiety disorder  F41.1       Plan: Patient is scheduled for follow-up; continue process work and developing coping skills.  Patient to work on short-term goal between sessions as identified above.  Progress note was dictated via Dragon and reviewed for accuracy.  [name], [facility]

## 2023-05-27 ENCOUNTER — Other Ambulatory Visit (HOSPITAL_COMMUNITY): Payer: Self-pay

## 2023-05-29 ENCOUNTER — Other Ambulatory Visit (HOSPITAL_COMMUNITY): Payer: Self-pay

## 2023-05-29 ENCOUNTER — Other Ambulatory Visit: Payer: Self-pay

## 2023-05-30 ENCOUNTER — Ambulatory Visit (INDEPENDENT_AMBULATORY_CARE_PROVIDER_SITE_OTHER): Payer: PPO | Admitting: Professional Counselor

## 2023-05-30 ENCOUNTER — Encounter: Payer: Self-pay | Admitting: Professional Counselor

## 2023-05-30 DIAGNOSIS — F411 Generalized anxiety disorder: Secondary | ICD-10-CM | POA: Diagnosis not present

## 2023-05-30 DIAGNOSIS — F330 Major depressive disorder, recurrent, mild: Secondary | ICD-10-CM

## 2023-05-30 NOTE — Progress Notes (Signed)
      [facility] Counselor/Therapist Progress Note  Patient ID: Shelley Vasquez, MRN: [id number],    Date: 05/30/2023  Time Spent: 1:06 PM to 2:06 PM  Treatment Type: Individual Therapy  Reported Symptoms: fatigue, preoccupying thoughts, fears, some social anxiety, worries, some restlessness, interpersonal concerns  Mental Status Exam:  Appearance:   Neat     Behavior:  Appropriate and Sharing  Motor:  Normal  Speech/Language:   Clear and Coherent and Normal Rate  Affect:  Appropriate and Congruent  Mood:  normal  Thought process:  normal  Thought content:    WNL  Sensory/Perceptual disturbances:    WNL  Orientation:  Sound  Attention:  Good  Concentration:  Good  Memory:  WNL  Fund of knowledge:   Good  Insight:    Good  Judgment:   Good  Impulse Control:  Good   Risk Assessment: Danger to Self:  No Self-injurious Behavior: No Danger to Others: No Duty to Warn:no Physical Aggression / Violence:No  Access to Firearms a concern: No  Gang Involvement:No   Subjective: Patient presented to session to address concerns of depression and anxiety.  Counselor and patient discussed patient updated treatment plan and patient gave her consent.  Patient and counselor discussed patient progress.  Patient identified improvement in interpersonal concerns, however she voiced continuing to work on communication styles that create agitation, such as interrupting husband.  She voiced this as having improved per therapeutic conversations and resources provided.  Patient voiced increasing her self-care and downtime, and having a sense of less anxiety and increased focus.  She identified some social anxiety and when going to in-person craft groups, particularly around germ fears.  Patient identified clonzapine to be helpful in mitigating symptomology.  Patient and counselor discussed patient awareness of lessons of giving and receiving in her life, and impactfulness.  Patient processed  meaningfulness of her spirituality to her life.  Counselor actively listened, affirmed patient, and helped to facilitate insight.  Interventions: Solution-Oriented/Positive Psychology, Humanistic/Existential, and Insight-Oriented  Diagnosis:   ICD-10-CM   1. Major depressive disorder, recurrent episode, mild (HCC)  F33.0     2. Generalized anxiety disorder  F41.1       Plan: Patient is scheduled for follow-up; continue process work and developing coping skills.  Patient short-term goal to continue to resource prescribed medications for exacerbated anxiety, and utilize relaxation skills as needed.  Progress note dictated via Dragon, and reviewed for accuracy.  [name], [facility]

## 2023-05-31 ENCOUNTER — Other Ambulatory Visit (HOSPITAL_COMMUNITY): Payer: Self-pay

## 2023-06-06 ENCOUNTER — Encounter: Payer: Self-pay | Admitting: Professional Counselor

## 2023-06-06 ENCOUNTER — Ambulatory Visit (INDEPENDENT_AMBULATORY_CARE_PROVIDER_SITE_OTHER): Payer: PPO | Admitting: Professional Counselor

## 2023-06-06 DIAGNOSIS — F330 Major depressive disorder, recurrent, mild: Secondary | ICD-10-CM

## 2023-06-06 DIAGNOSIS — F411 Generalized anxiety disorder: Secondary | ICD-10-CM

## 2023-06-06 NOTE — Progress Notes (Unsigned)
      [facility] Counselor/Therapist Progress Note  Patient ID: Shelley Vasquez, MRN: [id number],    Date: 06/06/2023  Time Spent: ***   Treatment Type: Individual Therapy  Reported Symptoms: ***  Mental Status Exam:  Appearance:   Neat     Behavior:  Appropriate, Sharing, and Motivated  Motor:  Normal  Speech/Language:   Clear and Coherent and Normal Rate  Affect:  Appropriate and Congruent  Mood:  normal  Thought process:  normal  Thought content:    WNL  Sensory/Perceptual disturbances:    WNL  Orientation:  Sound  Attention:  Good  Concentration:  Good  Memory:  WNL  Fund of knowledge:   Good  Insight:    Good  Judgment:   Good  Impulse Control:  Good   Risk Assessment: Danger to Self:  No Self-injurious Behavior: No Danger to Others: No Duty to Warn:no Physical Aggression / Violence:No  Access to Firearms a concern: No  Gang Involvement:No   Subjective: ***   Interventions: Solution-Oriented/Positive Psychology, Humanistic/Existential, and Insight-Oriented  Diagnosis:   ICD-10-CM   1. Major depressive disorder, recurrent episode, mild (HCC)  F33.0     2. Generalized anxiety disorder  F41.1       Plan: ***  [name], [facility]

## 2023-06-19 DIAGNOSIS — H2513 Age-related nuclear cataract, bilateral: Secondary | ICD-10-CM | POA: Diagnosis not present

## 2023-06-19 DIAGNOSIS — D443 Neoplasm of uncertain behavior of pituitary gland: Secondary | ICD-10-CM | POA: Diagnosis not present

## 2023-06-19 DIAGNOSIS — H40013 Open angle with borderline findings, low risk, bilateral: Secondary | ICD-10-CM | POA: Diagnosis not present

## 2023-06-22 ENCOUNTER — Other Ambulatory Visit (HOSPITAL_COMMUNITY): Payer: Self-pay

## 2023-06-22 DIAGNOSIS — Z79891 Long term (current) use of opiate analgesic: Secondary | ICD-10-CM | POA: Diagnosis not present

## 2023-06-22 MED ORDER — FENTANYL 25 MCG/HR TD PT72
1.0000 | MEDICATED_PATCH | TRANSDERMAL | 0 refills | Status: AC
  Filled 2023-06-28: qty 15, 30d supply, fill #0

## 2023-06-22 MED ORDER — FENTANYL 25 MCG/HR TD PT72
1.0000 | MEDICATED_PATCH | TRANSDERMAL | 0 refills | Status: AC | PRN
  Filled 2023-07-28: qty 15, 30d supply, fill #0

## 2023-06-22 MED ORDER — OXYCODONE-ACETAMINOPHEN 5-325 MG PO TABS
1.0000 | ORAL_TABLET | Freq: Four times a day (QID) | ORAL | 0 refills | Status: AC | PRN
Start: 2023-07-20 — End: ?
  Filled 2023-07-28 (×2): qty 120, 30d supply, fill #0

## 2023-06-22 MED ORDER — OXYCODONE-ACETAMINOPHEN 5-325 MG PO TABS
1.0000 | ORAL_TABLET | Freq: Four times a day (QID) | ORAL | 0 refills | Status: AC | PRN
  Filled 2023-06-22 – 2023-06-28 (×2): qty 120, 30d supply, fill #0

## 2023-06-23 ENCOUNTER — Other Ambulatory Visit (HOSPITAL_COMMUNITY): Payer: Self-pay

## 2023-06-28 ENCOUNTER — Other Ambulatory Visit (HOSPITAL_COMMUNITY): Payer: Self-pay

## 2023-06-29 ENCOUNTER — Other Ambulatory Visit (HOSPITAL_COMMUNITY): Payer: Self-pay

## 2023-06-30 ENCOUNTER — Ambulatory Visit (INDEPENDENT_AMBULATORY_CARE_PROVIDER_SITE_OTHER): Payer: PPO | Admitting: Professional Counselor

## 2023-06-30 ENCOUNTER — Encounter: Payer: Self-pay | Admitting: Professional Counselor

## 2023-06-30 DIAGNOSIS — F411 Generalized anxiety disorder: Secondary | ICD-10-CM

## 2023-06-30 DIAGNOSIS — F330 Major depressive disorder, recurrent, mild: Secondary | ICD-10-CM

## 2023-06-30 NOTE — Progress Notes (Signed)
      [facility] Counselor/Therapist Progress Note  Patient ID: Shelley Vasquez, MRN: [id number],    Date: 06/30/2023  Time Spent: 2:06 PM to 3:08 PM  Treatment Type: Individual Therapy  Reported Symptoms: Tearfulness, worries, grief/loss, sadness, interpersonal concerns  Mental Status Exam:  Appearance:   Neat     Behavior:  Appropriate, Sharing, and Motivated  Motor:  Normal  Speech/Language:   Clear and Coherent and Normal Rate  Affect:  Appropriate, Congruent, and Tearful  Mood:  sad  Thought process:  normal  Thought content:    WNL  Sensory/Perceptual disturbances:    WNL  Orientation:  oriented to person, place, time/date, and situation  Attention:  Good  Concentration:  Good  Memory:  WNL  Fund of knowledge:   Good  Insight:    Good  Judgment:   Good  Impulse Control:  Good   Risk Assessment: Danger to Self:  No Self-injurious Behavior: No Danger to Others: No Duty to Warn:no Physical Aggression / Violence:No  Access to Firearms a concern: No  Gang Involvement:No   Subjective: Patient presented to session to address concerns of anxiety and depression.  She reported mixed progress.  She identified progress in purging unwanted belongings, and in her demeanor and allowing her husband to remote without trying to fix or emotionally dysregulating.  Patient reported having had her 34th anniversary with her husband, and processed experience of reminiscing and reflecting on their vows.  Patient processed experience of her sister having challenges at this time, and how she is being of a support to her.  Patient processed grief and loss regarding family information that has come to light recently, and reflected on her sense of both gratitude and guilt and regret.  Counselor actively listened, held space for grief, affirmed patient feelings and experience, and helped facilitate insight.  Counselor facilitated self compassion assessment and discussed with patient.  Interventions:  Solution-Oriented/Positive Psychology, Humanistic/Existential, Grief Therapy, and Insight-Oriented  Diagnosis:   ICD-10-CM   1. Major depressive disorder, recurrent episode, mild (HCC)  F33.0     2. Generalized anxiety disorder  F41.1       Plan: Patient is scheduled for follow-up; continue process work and developing coping skills.  Patient identified short-term goal between sessions of reflecting on self compassion assessment and discerning statement of self compassion she could utilize as an affirmation in times of self blame.  Progress note was dictated with Dragon and reviewed for accuracy.  Shelley Vasquez, [facility]

## 2023-07-05 ENCOUNTER — Ambulatory Visit (INDEPENDENT_AMBULATORY_CARE_PROVIDER_SITE_OTHER): Payer: PPO | Admitting: Professional Counselor

## 2023-07-05 ENCOUNTER — Encounter: Payer: Self-pay | Admitting: Professional Counselor

## 2023-07-05 DIAGNOSIS — F330 Major depressive disorder, recurrent, mild: Secondary | ICD-10-CM | POA: Diagnosis not present

## 2023-07-05 DIAGNOSIS — F411 Generalized anxiety disorder: Secondary | ICD-10-CM | POA: Diagnosis not present

## 2023-07-05 NOTE — Progress Notes (Signed)
      [facility] Counselor/Therapist Progress Note  Patient ID: Shelley Vasquez, MRN: [id number],    Date: 07/05/2023  Time Spent: 1:08 PM to 2:10 PM  Treatment Type: Individual Therapy  Reported Symptoms: Worries, sense of guilt, grief/loss, low self-esteem, interpersonal concerns  Mental Status Exam:  Appearance:   Neat     Behavior:  Appropriate, Sharing, and Motivated  Motor:  Normal  Speech/Language:   Clear and Coherent and Normal Rate  Affect:  Appropriate and Congruent  Mood:  normal  Thought process:  normal  Thought content:    WNL  Sensory/Perceptual disturbances:    WNL  Orientation:  oriented to person, place, time/date, and situation  Attention:  Good  Concentration:  Good  Memory:  WNL  Fund of knowledge:   Good  Insight:    Good  Judgment:   Good  Impulse Control:  Good   Risk Assessment: Danger to Self:  No Self-injurious Behavior: No Danger to Others: No Duty to Warn:no Physical Aggression / Violence:No  Access to Firearms a concern: No  Gang Involvement:No   Subjective: Patient presented to session to address concerns of depression and anxiety.  She reported mixed progress at this time.  Patient reported progress in having utilized her listening skills when husband was frustrated, and for she herself to have not dysregulated and responded supportively, and continuing to enjoy success around self-regulation.  She reported having been reflecting on her experience of guilt as discussed last session, and to have continued discussion with spouse to helpful effect.  Counselor affirmed patient internal and external resourcing.  Counselor and patient discussed reinforcing points such as her limitations at the time of her father's illness, and need to focus on her own wellbeing, and to continue to resource capacity for self compassion.  Counselor facilitated self compassion screener and discussed results with patient.  Counselor helped to reinforce knowing oneself,  knowing 1's limitations, and being at peace and accepting of them.    Interventions: Solution-Oriented/Positive Psychology, Humanistic/Existential, and Insight-Oriented  Diagnosis:   ICD-10-CM   1. Major depressive disorder, recurrent episode, mild (HCC)  F33.0     2. Generalized anxiety disorder  F41.1       Plan: Patient is scheduled for follow-up; continue process work and developing coping skills.  Patient identified short-term goal between sessions to work on creating a self composed affirmation based off of highlights from self compassion screener so as to encourage self acceptance and increase self-esteem.  Progress note was dictated with Dragon and reviewed for accuracy.  [name], [facility]

## 2023-07-11 ENCOUNTER — Encounter: Payer: Self-pay | Admitting: Professional Counselor

## 2023-07-11 ENCOUNTER — Ambulatory Visit (INDEPENDENT_AMBULATORY_CARE_PROVIDER_SITE_OTHER): Payer: PPO | Admitting: Professional Counselor

## 2023-07-11 DIAGNOSIS — F411 Generalized anxiety disorder: Secondary | ICD-10-CM | POA: Diagnosis not present

## 2023-07-11 DIAGNOSIS — F330 Major depressive disorder, recurrent, mild: Secondary | ICD-10-CM

## 2023-07-11 NOTE — Progress Notes (Signed)
      [facility] Counselor/Therapist Progress Note  Patient ID: Shelley Vasquez, MRN: [id number],    Date: 07/11/2023  Time Spent: 2:12 PM to 3:12 PM  Treatment Type: Individual Therapy  Reported Symptoms: Worries, stress, intermittent low mood, low self-esteem  Mental Status Exam:  Appearance:   Neat     Behavior:  Appropriate, Sharing, and Motivated  Motor:  Normal  Speech/Language:   Clear and Coherent and Normal Rate  Affect:  Appropriate and Congruent  Mood:  normal  Thought process:  normal  Thought content:    WNL  Sensory/Perceptual disturbances:    WNL  Orientation:  oriented to person, place, time/date, and situation  Attention:  Good  Concentration:  Good  Memory:  WNL  Fund of knowledge:   Good  Insight:    Good  Judgment:   Good  Impulse Control:  Good   Risk Assessment: Danger to Self:  No Self-injurious Behavior: No Danger to Others: No Duty to Warn:no Physical Aggression / Violence:No  Access to Firearms a concern: No  Gang Involvement:No   Subjective: Patient presented to session to address concerns of anxiety and depression.  Patient voiced progress at this time.  Patient voiced having worked on short-term goal from last session of prioritizing positive self affirmations.  She reflected on difficulty in taking a complement.  Counselor and patient discussed patient sense of self worth and strategies to continue to increase positive self talk.  Patient reflected on importance of her faith to her wellness, and shared with counselor regarding values within her faith such as humility that help fortify her.  Patient shared deeply about her faith, and counselor actively listened, affirmed patient, helped patient resource spirituality by reinforcing patient value system.  Interventions: Solution-Oriented/Positive Psychology, Humanistic/Existential, and Insight-Oriented, Spiritually-Integrated Psychotherapy  Diagnosis:   ICD-10-CM   1. Major depressive disorder,  recurrent episode, mild (HCC)  F33.0     2. Generalized anxiety disorder  F41.1       Plan: Patient is scheduled for follow-up; continue process work and developing coping skills.  Patient short-term goal between session to continue to resource spirituality to help alleviate symptomology, and continue to work to integrate positive self talk into daily life.  Progress note was dictated with Dragon and reviewed for accuracy.  [name], [facility]

## 2023-07-18 ENCOUNTER — Ambulatory Visit (INDEPENDENT_AMBULATORY_CARE_PROVIDER_SITE_OTHER): Payer: PPO | Admitting: Professional Counselor

## 2023-07-18 ENCOUNTER — Encounter: Payer: Self-pay | Admitting: Professional Counselor

## 2023-07-18 DIAGNOSIS — F330 Major depressive disorder, recurrent, mild: Secondary | ICD-10-CM | POA: Diagnosis not present

## 2023-07-18 DIAGNOSIS — F411 Generalized anxiety disorder: Secondary | ICD-10-CM

## 2023-07-18 NOTE — Progress Notes (Signed)
      [facility] Counselor/Therapist Progress Note  Patient ID: Shelley Vasquez, MRN: [id number],    Date: 07/18/2023  Time Spent: 1:11 PM to 2:09 PM  Treatment Type: Individual Therapy  Reported Symptoms: Worries, stress, motivation concerns  Mental Status Exam:  Appearance:   Neat     Behavior:  Appropriate and Sharing  Motor:  Normal  Speech/Language:   Clear and Coherent and Normal Rate  Affect:  Appropriate and Congruent  Mood:  normal  Thought process:  normal  Thought content:    WNL  Sensory/Perceptual disturbances:    WNL  Orientation:  oriented to person, place, time/date, and situation  Attention:  Good  Concentration:  Good  Memory:  WNL  Fund of knowledge:   Good  Insight:    Good  Judgment:   Good  Impulse Control:  Good   Risk Assessment: Danger to Self:  No Self-injurious Behavior: No Danger to Others: No Duty to Warn:no Physical Aggression / Violence:No  Access to Firearms a concern: No  Gang Involvement:No   Subjective: Patient presented to session to address concerns of depression and anxiety.  She reported progress.  Patient shared regarding ongoing improvements in marital relationship, as she continues to practice skills discussed in session to limit emotional dysregulation and increase supportiveness and harmony.  Counselor affirmed patient proactive and dedicated application of therapeutic work.  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  Patient voiced desire to increase movement and exercise, and counselor reinforced patient attention to holistic wellness and her chosen personal goals.  Interventions: Solution-Oriented/Positive Psychology, Humanistic/Existential, and Insight-Oriented  Diagnosis:   ICD-10-CM   1. Major depressive disorder, recurrent episode, mild (HCC)  F33.0     2. Generalized anxiety disorder   F41.1       Plan: Patient is scheduled for follow-up; continue process work and developing coping skills.  Patient short-term goal between sessions to reconnect with her swimming routine with regularity.  Progress note was dictated with Dragon and reviewed for accuracy.  [name], [facility]

## 2023-07-25 ENCOUNTER — Other Ambulatory Visit (HOSPITAL_COMMUNITY): Payer: Self-pay

## 2023-07-25 ENCOUNTER — Ambulatory Visit: Payer: PPO | Admitting: Professional Counselor

## 2023-07-28 ENCOUNTER — Other Ambulatory Visit (HOSPITAL_COMMUNITY): Payer: Self-pay

## 2023-07-28 ENCOUNTER — Other Ambulatory Visit: Payer: Self-pay

## 2023-07-31 ENCOUNTER — Other Ambulatory Visit (HOSPITAL_COMMUNITY): Payer: Self-pay

## 2023-07-31 DIAGNOSIS — Z79899 Other long term (current) drug therapy: Secondary | ICD-10-CM | POA: Diagnosis not present

## 2023-08-01 ENCOUNTER — Ambulatory Visit: Payer: PPO | Admitting: Professional Counselor

## 2023-08-01 ENCOUNTER — Encounter: Payer: Self-pay | Admitting: Professional Counselor

## 2023-08-01 ENCOUNTER — Other Ambulatory Visit (HOSPITAL_COMMUNITY): Payer: Self-pay

## 2023-08-01 DIAGNOSIS — F411 Generalized anxiety disorder: Secondary | ICD-10-CM

## 2023-08-01 DIAGNOSIS — F330 Major depressive disorder, recurrent, mild: Secondary | ICD-10-CM | POA: Diagnosis not present

## 2023-08-01 NOTE — Progress Notes (Signed)
      [facility] Counselor/Therapist Progress Note  Patient ID: Shelley Vasquez, MRN: [id number],    Date: 08/01/2023  Time Spent: 1:13 PM to 2:12 PM  Treatment Type: Individual Therapy  Reported Symptoms: Sadness, low mood, grief/loss, anhedonia, low motivation, worries  Mental Status Exam:  Appearance:   Neat     Behavior:  Appropriate and Sharing  Motor:  Normal  Speech/Language:   Clear and Coherent and Normal Rate  Affect:  Appropriate and Congruent  Mood:  normal  Thought process:  normal  Thought content:    WNL  Sensory/Perceptual disturbances:    WNL  Orientation:  oriented to person, place, time/date, and situation  Attention:  Good  Concentration:  Good  Memory:  WNL  Fund of knowledge:   Good  Insight:    Good  Judgment:   Good  Impulse Control:  Good   Risk Assessment: Danger to Self:  No Self-injurious Behavior: No Danger to Others: No Duty to Warn:no Physical Aggression / Violence:No  Access to Firearms a concern: No  Gang Involvement:No   Subjective: Patient presented to session to address concerns of anxiety and depression.  She reported minimal progress at this time.  Patient processed history of grief and loss including as relates her father and missing her friend who she lost in recent months.  Patient processed experience of grief and loss, and counselor held space for patient feelings and experience.  Counselor helped patient resource inner strengths of gratitude and resiliency, and helped to [employer] and honor her friend, and they discussed ways patient can resource her creativity to help heal and move through grief optimally.  Interventions: Solution-Oriented/Positive Psychology, Humanistic/Existential, and Insight-Oriented, Grief Therapy  Diagnosis:   ICD-10-CM   1. Major depressive disorder, recurrent episode, mild (HCC)  F33.0     2. Generalized anxiety disorder  F41.1       Plan: Patient is scheduled for a follow-up; continue process  work and developing coping skills.  Patient to resource her creativity as a short-term goal between sessions as a therapeutic coping skill.  Progress note was dictated with Dragon and reviewed for accuracy.  [name], [facility]

## 2023-08-08 ENCOUNTER — Encounter: Payer: Self-pay | Admitting: Professional Counselor

## 2023-08-08 ENCOUNTER — Ambulatory Visit (INDEPENDENT_AMBULATORY_CARE_PROVIDER_SITE_OTHER): Payer: PPO | Admitting: Professional Counselor

## 2023-08-08 DIAGNOSIS — F331 Major depressive disorder, recurrent, moderate: Secondary | ICD-10-CM | POA: Diagnosis not present

## 2023-08-08 DIAGNOSIS — F411 Generalized anxiety disorder: Secondary | ICD-10-CM | POA: Diagnosis not present

## 2023-08-08 NOTE — Progress Notes (Signed)
 [facility] Counselor/Therapist Progress Note  Patient ID: Shelley Vasquez, MRN: [id number],    Date: 08/08/2023  Time Spent: 2:13 PM to 3:20 PM  Treatment Type: Family with patient  Pt was present with spouse for session.  Reported Symptoms: Restlessness, irritability, worries, sadness, low mood, interpersonal concerns, low sleep, separation anxiety, fears  Mental Status Exam:  Appearance:   Neat     Behavior:  Appropriate, Sharing, and Motivated  Motor:  Normal  Speech/Language:   Clear and Coherent and Normal Rate  Affect:  Appropriate, Congruent, and Tearful  Mood:  sad  Thought process:  normal  Thought content:    WNL  Sensory/Perceptual disturbances:    WNL  Orientation:  oriented to person, place, time/date, and situation  Attention:  Good  Concentration:  Good  Memory:  WNL  Fund of knowledge:   Good  Insight:    Good  Judgment:   Good  Impulse Control:  Good   Risk Assessment: Danger to Self:  No Self-injurious Behavior: No Danger to Others: No Duty to Warn:no Physical Aggression / Violence:No  Access to Firearms a concern: No  Gang Involvement:No   Subjective: Patient presented to session to address concerns of anxiety and depression.  She reported minimal progress at this time.  Patient spouse was present for session as well to discuss patient care.  Patient processed experience of concern for husband's health due to a diagnostic issue.  Patient voiced experience of not being used to seeing patient in a vulnerable state or to be compromised, and counselor helped facilitate insight around the duality of attachment with reality and acceptance.  Counselor patient and patient has been discussed working to not assigned reactions to 1 and others experience, and they discussed mindfulness skills and other strategies to cultivate increased harmony and reduction of symptoms.  Patient identified what is within her control in the way of her reactions in her building of  safety and support, and need to address her fears regarding the idea of losing her husband.  Counselor affirmed patient feelings, and encouraged patient and patient has been sharing a feelings to 1 another; emotional processing ensued and was voiced as therapeutic by patient.  Counselor patient and patient husband discussed how to alleviate some of these fears by virtue of future planning including financial advisement and ideas for alternate housing.  They discussed transforming fear into gratitude as a mindfulness practice.  Counselor helped patient identify helpful sleep hygiene routines strategies including taking a bath, getting movement every day, reading versus screen time before bed.  Counselor patient and patient husband discussed structuring her days so that they have time when they both are feeling energized to spend quality time together to limit potential irritability between them.  Interventions: Solution-Oriented/Positive Psychology, Humanistic/Existential, Insight-Oriented, Family Systems, and EFT  Diagnosis:   ICD-10-CM   1. Major depressive disorder, recurrent episode, moderate (HCC)  F33.1     2. Generalized anxiety disorder  F41.1       Plan: Patient is scheduled for follow-up; continue process work and developing coping skills.  Patient personal goal between sessions to work on sleep hygiene stress days as identified above; couple to address strategies to alleviate patient future and uncertainty fears also as identified above.  Patient to practice mindfulness and acceptance skill set to help alleviate symptomology.  Progress note was dictated with Dragon and reviewed for accuracy.  [name], [facility]

## 2023-08-11 ENCOUNTER — Ambulatory Visit: Payer: PPO | Admitting: Professional Counselor

## 2023-08-15 ENCOUNTER — Ambulatory Visit: Payer: PPO | Admitting: Professional Counselor

## 2023-08-15 ENCOUNTER — Other Ambulatory Visit (HOSPITAL_COMMUNITY): Payer: Self-pay

## 2023-08-15 MED ORDER — OXYCODONE-ACETAMINOPHEN 5-325 MG PO TABS
1.0000 | ORAL_TABLET | Freq: Four times a day (QID) | ORAL | 0 refills | Status: AC | PRN
Start: 2023-08-29 — End: ?
  Filled 2023-08-29: qty 120, 30d supply, fill #0

## 2023-08-15 MED ORDER — FENTANYL 25 MCG/HR TD PT72
1.0000 | MEDICATED_PATCH | TRANSDERMAL | 0 refills | Status: AC
  Filled 2023-09-23: qty 15, 30d supply, fill #0

## 2023-08-15 MED ORDER — OXYCODONE-ACETAMINOPHEN 5-325 MG PO TABS
1.0000 | ORAL_TABLET | Freq: Four times a day (QID) | ORAL | 0 refills | Status: AC | PRN
  Filled 2023-09-27: qty 120, 30d supply, fill #0

## 2023-08-15 MED ORDER — NALOXONE HCL 4 MG/0.1ML NA LIQD
NASAL | 1 refills | Status: AC
  Filled 2023-08-15 – 2023-08-24 (×2): qty 2, 1d supply, fill #0

## 2023-08-15 MED ORDER — FENTANYL 25 MCG/HR TD PT72
1.0000 | MEDICATED_PATCH | TRANSDERMAL | 0 refills | Status: AC
Start: 2023-08-25 — End: ?
  Filled 2023-08-25: qty 15, 30d supply, fill #0

## 2023-08-21 ENCOUNTER — Ambulatory Visit: Payer: PPO | Admitting: Professional Counselor

## 2023-08-22 ENCOUNTER — Ambulatory Visit (INDEPENDENT_AMBULATORY_CARE_PROVIDER_SITE_OTHER): Payer: PPO | Admitting: Professional Counselor

## 2023-08-22 ENCOUNTER — Encounter: Payer: Self-pay | Admitting: Professional Counselor

## 2023-08-22 ENCOUNTER — Other Ambulatory Visit (HOSPITAL_COMMUNITY): Payer: Self-pay

## 2023-08-22 DIAGNOSIS — F411 Generalized anxiety disorder: Secondary | ICD-10-CM

## 2023-08-22 DIAGNOSIS — F332 Major depressive disorder, recurrent severe without psychotic features: Secondary | ICD-10-CM

## 2023-08-22 NOTE — Progress Notes (Signed)
      [facility] Counselor/Therapist Progress Note  Patient ID: Shelley Vasquez, MRN: [id number],    Date: 08/22/2023  Time Spent: 2:10 PM to 3:09 PM  Treatment Type: Individual Therapy  Reported Symptoms: Worries, trouble relaxing, nervousness, increased sleep, anhedonia, low motivation, social isolation, tearfulness, emotional eating, stress  Mental Status Exam:  Appearance:   Neat     Behavior:  Appropriate and Sharing  Motor:  Normal  Speech/Language:   Clear and Coherent and Normal Rate  Affect:  Depressed and Tearful  Mood:  depressed  Thought process:  normal  Thought content:    WNL  Sensory/Perceptual disturbances:    WNL  Orientation:  oriented to person, place, time/date, and situation  Attention:  Good  Concentration:  Good  Memory:  WNL  Fund of knowledge:   Good  Insight:    Good  Judgment:   Good  Impulse Control:  Good   Risk Assessment: Danger to Self:  No Self-injurious Behavior: No Danger to Others: No Duty to Warn:no Physical Aggression / Violence:No  Access to Firearms a concern: No  Gang Involvement:No   Subjective: The patient presented to session to address concerns of anxiety and depression.  She reported excessive sleep, up to 16 to 18 hours a day.  She reported significant emotional/stress eating tendencies.  Patient voiced trying to work on her diet and taking care of herself and her husband's health.  She voiced having reflected on prior session and working on her sense of hopefulness and she and husband as being proactive about long-term care planning as a result of therapeutic conversation.  She voiced that talking about feared items has been very helpful in mitigating symptoms, giving a sense of control to fears.  Counselor validated patient sense of fearfulness and affirmed patient progress in increasing agency around her fears and choice and outcomes.  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  She identified not wishing to do her crafting at all, which is usually a strong coping mechanism for her.  Counselor facilitated PHQ-9 and patient had a score of 20, GAD-7 with a score of 14.  Patient voiced intention to discuss symptomology with her psychiatrist which counselor reinforced.  They discussed patient holistic safety as priority at this time.  Interventions: Solution-Oriented/Positive Psychology, Humanistic/Existential, and Insight-Oriented  Diagnosis:   ICD-10-CM   1. Severe episode of recurrent major depressive disorder, without psychotic features (HCC)  F33.2     2. Generalized anxiety disorder  F41.1       Plan: Patient is scheduled for follow-up; continue process work and developing coping skills.  Patient personal goal between sessions to consult with psychiatrist, prioritize self-care and safety around exacerbation of symptoms at this time.  Progress note was dictated with Dragon and reviewed for accuracy.  [name], [facility]

## 2023-08-24 ENCOUNTER — Other Ambulatory Visit (HOSPITAL_COMMUNITY): Payer: Self-pay

## 2023-08-25 ENCOUNTER — Other Ambulatory Visit (HOSPITAL_COMMUNITY): Payer: Self-pay

## 2023-08-25 ENCOUNTER — Other Ambulatory Visit: Payer: Self-pay

## 2023-08-28 ENCOUNTER — Other Ambulatory Visit (HOSPITAL_COMMUNITY): Payer: Self-pay

## 2023-08-29 ENCOUNTER — Other Ambulatory Visit (HOSPITAL_COMMUNITY): Payer: Self-pay

## 2023-08-30 ENCOUNTER — Ambulatory Visit: Payer: PPO | Admitting: Professional Counselor

## 2023-08-30 ENCOUNTER — Other Ambulatory Visit (HOSPITAL_COMMUNITY): Payer: Self-pay

## 2023-09-07 ENCOUNTER — Encounter: Payer: Self-pay | Admitting: Professional Counselor

## 2023-09-07 ENCOUNTER — Ambulatory Visit: Payer: PPO | Admitting: Professional Counselor

## 2023-09-07 DIAGNOSIS — F411 Generalized anxiety disorder: Secondary | ICD-10-CM | POA: Diagnosis not present

## 2023-09-07 DIAGNOSIS — F331 Major depressive disorder, recurrent, moderate: Secondary | ICD-10-CM | POA: Diagnosis not present

## 2023-09-07 NOTE — Progress Notes (Signed)
      [facility] Counselor/Therapist Progress Note  Patient ID: Shelley Vasquez, MRN: [id number],    Date: 09/07/2023  Time Spent: 1:07 PM to 2:08 PM  Treatment Type: Individual Therapy  Reported Symptoms: Increased sleep, stress, worries, fears, crying spells, sense of overwhelm, restlessness, nervousness, low motivation, anhedonia, fatigue  Mental Status Exam:  Appearance:   Neat     Behavior:  Appropriate, Sharing, and Motivated  Motor:  Normal  Speech/Language:   Clear and Coherent and Normal Rate  Affect:  Appropriate and Congruent  Mood:  normal  Thought process:  normal  Thought content:    WNL  Sensory/Perceptual disturbances:    WNL  Orientation:  oriented to person, place, time/date, and situation  Attention:  Good  Concentration:  Good  Memory:  WNL  Fund of knowledge:   Good  Insight:    Good  Judgment:   Good  Impulse Control:  Good   Risk Assessment: Danger to Self:  No Self-injurious Behavior: No Danger to Others: No Duty to Warn:no Physical Aggression / Violence:No  Access to Firearms a concern: No  Gang Involvement:No   Subjective: Patient presented to session to address concerns of anxiety depression.  She reported limited progress at this time.  She reported continuing concerns for her husband's health.  She identified working to practice sitting with her husband and offering companionship and nurture, and limiting fear-based reactivity to circumstances.  Counselor actively listened, and affirmed patient and her efforts and progress in coping with sense of distress.  Patient processed experience of a conflict with a family friend, and voiced how she coped and handled the situation, and counselor assisted in discernment of patient use of interpersonal effectiveness skills including collaboration, compromise, and voicing of needs.  Counselor and patient also discussed relational styles between she and her husband and their emotional orientations.  Patient also  processed experience of triggers of being in the hospital from experiences of the past, resonating with current visits.  Patient shared that her psychiatrist had increased her medication since last session due to worsening symptomology.  Interventions: Solution-Oriented/Positive Psychology, Humanistic/Existential, and Insight-Oriented  Diagnosis:   ICD-10-CM   1. Major depressive disorder, recurrent episode, moderate (HCC)  F33.1     2. Generalized anxiety disorder  F41.1       Plan: Patient is scheduled for follow-up; continue process work and developing coping skills.  Patient short-term goal between sessions to continue to prioritize self-care and exercise self compassion.  Progress note was dictated with Dragon and reviewed for accuracy.  [name], [facility]

## 2023-09-18 ENCOUNTER — Other Ambulatory Visit (HOSPITAL_COMMUNITY): Payer: Self-pay

## 2023-09-21 ENCOUNTER — Other Ambulatory Visit (HOSPITAL_COMMUNITY): Payer: Self-pay

## 2023-09-21 ENCOUNTER — Other Ambulatory Visit: Payer: Self-pay

## 2023-09-22 ENCOUNTER — Other Ambulatory Visit (HOSPITAL_COMMUNITY): Payer: Self-pay

## 2023-09-23 ENCOUNTER — Other Ambulatory Visit (HOSPITAL_COMMUNITY): Payer: Self-pay

## 2023-09-24 DIAGNOSIS — R5383 Other fatigue: Secondary | ICD-10-CM | POA: Diagnosis not present

## 2023-09-24 DIAGNOSIS — R52 Pain, unspecified: Secondary | ICD-10-CM | POA: Diagnosis not present

## 2023-09-24 DIAGNOSIS — R509 Fever, unspecified: Secondary | ICD-10-CM | POA: Diagnosis not present

## 2023-09-24 DIAGNOSIS — B349 Viral infection, unspecified: Secondary | ICD-10-CM | POA: Diagnosis not present

## 2023-09-25 ENCOUNTER — Other Ambulatory Visit (HOSPITAL_COMMUNITY): Payer: Self-pay

## 2023-09-27 ENCOUNTER — Other Ambulatory Visit (HOSPITAL_COMMUNITY): Payer: Self-pay

## 2023-09-27 ENCOUNTER — Other Ambulatory Visit: Payer: Self-pay

## 2023-09-28 ENCOUNTER — Other Ambulatory Visit (HOSPITAL_COMMUNITY): Payer: Self-pay

## 2023-09-28 ENCOUNTER — Ambulatory Visit: Payer: PPO | Admitting: Professional Counselor

## 2023-10-10 ENCOUNTER — Other Ambulatory Visit (HOSPITAL_COMMUNITY): Payer: Self-pay

## 2023-10-10 ENCOUNTER — Ambulatory Visit: Payer: PPO | Admitting: Professional Counselor

## 2023-10-10 DIAGNOSIS — M25512 Pain in left shoulder: Secondary | ICD-10-CM | POA: Diagnosis not present

## 2023-10-10 DIAGNOSIS — M47816 Spondylosis without myelopathy or radiculopathy, lumbar region: Secondary | ICD-10-CM | POA: Diagnosis not present

## 2023-10-10 DIAGNOSIS — G894 Chronic pain syndrome: Secondary | ICD-10-CM | POA: Diagnosis not present

## 2023-10-10 DIAGNOSIS — M40204 Unspecified kyphosis, thoracic region: Secondary | ICD-10-CM | POA: Diagnosis not present

## 2023-10-10 MED ORDER — FENTANYL 25 MCG/HR TD PT72
1.0000 | MEDICATED_PATCH | TRANSDERMAL | 0 refills | Status: AC
  Filled 2023-10-21: qty 15, 30d supply, fill #0

## 2023-10-10 MED ORDER — OXYCODONE-ACETAMINOPHEN 5-325 MG PO TABS
1.0000 | ORAL_TABLET | Freq: Four times a day (QID) | ORAL | 0 refills | Status: AC | PRN
  Filled 2023-10-26: qty 120, 30d supply, fill #0

## 2023-10-10 MED ORDER — FENTANYL 25 MCG/HR TD PT72
1.0000 | MEDICATED_PATCH | TRANSDERMAL | 0 refills | Status: AC
  Filled 2023-11-20: qty 15, 30d supply, fill #0

## 2023-10-10 MED ORDER — OXYCODONE-ACETAMINOPHEN 5-325 MG PO TABS
1.0000 | ORAL_TABLET | Freq: Four times a day (QID) | ORAL | 0 refills | Status: AC | PRN
Start: 2023-11-24 — End: ?
  Filled 2023-11-24: qty 120, 30d supply, fill #0

## 2023-10-12 ENCOUNTER — Ambulatory Visit (INDEPENDENT_AMBULATORY_CARE_PROVIDER_SITE_OTHER): Payer: PPO | Admitting: Professional Counselor

## 2023-10-12 ENCOUNTER — Encounter: Payer: Self-pay | Admitting: Professional Counselor

## 2023-10-12 DIAGNOSIS — F411 Generalized anxiety disorder: Secondary | ICD-10-CM

## 2023-10-12 DIAGNOSIS — F331 Major depressive disorder, recurrent, moderate: Secondary | ICD-10-CM | POA: Diagnosis not present

## 2023-10-12 NOTE — Progress Notes (Signed)
      [facility] Counselor/Therapist Progress Note  Patient ID: RUBINA BASINSKI, MRN: [id number],    Date: 10/12/2023  Time Spent: 1:12 PM to 2:08 PM  Treatment Type: Individual Therapy  Reported Symptoms: Grief/loss, worries, restlessness, nervousness, sadness, low mood, anhedonia, low motivation, caregiver strain, fatigue  Mental Status Exam:  Appearance:   Neat     Behavior:  Appropriate, Sharing, and Motivated  Motor:  Normal  Speech/Language:   Clear and Coherent and Normal Rate  Affect:  Appropriate and Congruent  Mood:  normal  Thought process:  normal  Thought content:    WNL  Sensory/Perceptual disturbances:    WNL  Orientation:  oriented to person, place, time/date, and situation  Attention:  Good  Concentration:  Good  Memory:  WNL  Fund of knowledge:   Good  Insight:    Good  Judgment:   Good  Impulse Control:  Good   Risk Assessment: Danger to Self:  No Self-injurious Behavior: No Danger to Others: No Duty to Warn:no Physical Aggression / Violence:No  Access to Firearms a concern: No  Gang Involvement:No   Subjective: Patient presented to session to address concerns of anxiety and depression.  She reported mixed progress at this time.  She reported experience of grief and loss of her cousin and friend this year and sadness around these circumstances, mixed with happiness for their afterlife.  She processed experience of her husband's health and his related stress concerns, and worries thereby.  She voiced feeling exhausted from their challenges at this time, and to still not have an interest in her crafting, which prior to recent months dip in depression was a significant coping skill and outlet.  Patient also reported positivity in that she and husband are feeling a great deal of support from church family and friends.  She also reported positive development in she and spouse having toured retirement community, and her connection with her nephew having been  fortifying recently.  Patient reported her prescribing provider to have increased her Viibryd  and Intuniv  prescriptions, and for her to be helpful about outcome.  Counselor actively listened, affirmed patient feelings and experience and resilient outlook and approach to life, and help facilitate insight around patient's sense that she has a difficult time "un-attaching" with husband; counselor provided psychoeducation around effective dependency, normalizing attachment while also encouraging mindfulness around self resourcing.  Counselor assisted patient in developing strategies to best support husband including voicing statements such as "I am here for you" versus "fixing" mindset that has potential to cause friction.  Interventions: Solution-Oriented/Positive Psychology, Humanistic/Existential, and Insight-Oriented  Diagnosis:   ICD-10-CM   1. Generalized anxiety disorder  F41.1     2. Major depressive disorder, recurrent episode, moderate (HCC)  F33.1       Plan: Patient is scheduled for follow-up; continue process work and developing coping skills.  Patient short-term goal between sessions to practice varying approaches to support of husband, and continue to resource external supports for help with extenuating circumstances.  Progress note was dictated with Dragon and reviewed for accuracy.  Anthon Kins, [facility]

## 2023-10-18 ENCOUNTER — Other Ambulatory Visit (HOSPITAL_COMMUNITY): Payer: Self-pay

## 2023-10-19 ENCOUNTER — Other Ambulatory Visit (HOSPITAL_COMMUNITY): Payer: Self-pay

## 2023-10-20 ENCOUNTER — Other Ambulatory Visit (HOSPITAL_COMMUNITY): Payer: Self-pay

## 2023-10-21 ENCOUNTER — Other Ambulatory Visit (HOSPITAL_COMMUNITY): Payer: Self-pay

## 2023-10-23 ENCOUNTER — Other Ambulatory Visit (HOSPITAL_COMMUNITY): Payer: Self-pay

## 2023-10-24 ENCOUNTER — Ambulatory Visit: Payer: PPO | Admitting: Professional Counselor

## 2023-10-25 ENCOUNTER — Other Ambulatory Visit (HOSPITAL_COMMUNITY): Payer: Self-pay

## 2023-10-26 ENCOUNTER — Other Ambulatory Visit: Payer: Self-pay

## 2023-10-26 ENCOUNTER — Other Ambulatory Visit (HOSPITAL_COMMUNITY): Payer: Self-pay

## 2023-11-14 ENCOUNTER — Other Ambulatory Visit (HOSPITAL_COMMUNITY): Payer: Self-pay

## 2023-11-14 ENCOUNTER — Ambulatory Visit: Payer: PPO | Admitting: Professional Counselor

## 2023-11-16 ENCOUNTER — Other Ambulatory Visit (HOSPITAL_BASED_OUTPATIENT_CLINIC_OR_DEPARTMENT_OTHER): Payer: Self-pay

## 2023-11-16 ENCOUNTER — Other Ambulatory Visit (HOSPITAL_COMMUNITY): Payer: Self-pay

## 2023-11-17 ENCOUNTER — Other Ambulatory Visit (HOSPITAL_COMMUNITY): Payer: Self-pay

## 2023-11-20 ENCOUNTER — Other Ambulatory Visit (HOSPITAL_COMMUNITY): Payer: Self-pay

## 2023-11-22 ENCOUNTER — Other Ambulatory Visit (HOSPITAL_COMMUNITY): Payer: Self-pay

## 2023-11-24 ENCOUNTER — Other Ambulatory Visit (HOSPITAL_COMMUNITY): Payer: Self-pay

## 2023-11-27 ENCOUNTER — Other Ambulatory Visit (HOSPITAL_COMMUNITY): Payer: Self-pay

## 2023-11-28 ENCOUNTER — Ambulatory Visit (INDEPENDENT_AMBULATORY_CARE_PROVIDER_SITE_OTHER): Admitting: Professional Counselor

## 2023-11-28 ENCOUNTER — Encounter: Payer: Self-pay | Admitting: Professional Counselor

## 2023-11-28 DIAGNOSIS — F411 Generalized anxiety disorder: Secondary | ICD-10-CM | POA: Diagnosis not present

## 2023-11-28 DIAGNOSIS — F330 Major depressive disorder, recurrent, mild: Secondary | ICD-10-CM

## 2023-11-28 NOTE — Progress Notes (Signed)
      [facility] Counselor/Therapist Progress Note  Patient ID: Shelley Vasquez, MRN: [id number],    Date: 11/28/2023  Time Spent: 1:10 PM - 2:08 PM   Treatment Type: Individual Therapy  Reported Symptoms: fatigue, worries, intermittent low mood and anhedonia and low motivation, stress, health concerns, caregiver strain  Mental Status Exam:  Appearance:   Neat     Behavior:  Appropriate, Sharing, and Motivated  Motor:  Normal  Speech/Language:   Clear and Coherent and Normal Rate  Affect:  Appropriate and Blunt  Mood:  normal  Thought process:  normal  Thought content:    WNL  Sensory/Perceptual disturbances:    WNL  Orientation:  oriented to person, place, time/date, and situation  Attention:  Good  Concentration:  Good  Memory:  WNL  Fund of knowledge:   Good  Insight:    Good  Judgment:   Good  Impulse Control:  Good   Risk Assessment: Danger to Self:  No Self-injurious Behavior: No Danger to Others: No Duty to Warn:no Physical Aggression / Violence:No  Access to Firearms a concern: No  Gang Involvement:No   Subjective: Patient presented to session to address concerns of anxiety and depression.  She reported progress.  Patient reported focusing on her husband's health a great deal at this time, and for them to be taking a nutrition class together.  She voiced the energy expended toward this endeavor as challenging but for the learning to be helpful to their joint personal goals.  Patient voiced having resumed her crafting which is a creative outlet that helps symptoms.  She reported her sleep to still not be optimal, however to be feeling more hopeful lately.  She reported her springtime depression to feel to be lifting, and for an increase in Viibryd  to be a contributing factor.  Patient voiced gratitude for her husband's mental outlook to have improved.  She processed experience of their increased connection, and acclamation to a new normal after his considerable health  concerns.  Counselor actively listened, affirmed patient feelings and experience, and helped to reinforce patient strengths and positive coping skills.  Interventions: Humanistic/Existential and Insight-Oriented, Strength-Based-Positive Psychology  Diagnosis:   ICD-10-CM   1. Generalized anxiety disorder  F41.1     2. Major depressive disorder, recurrent episode, mild (HCC)  F33.0       Plan: Pt is scheduled for a follow-up; continue process work and developing coping skills. Pt STG between sessions to continue to resource crafting and other supportive measures, and continue to practice interpersonal effectiveness skills with husband including reciprocal morale-building strategies.  Almarie ONEIDA Sprang, [facility]

## 2023-12-06 ENCOUNTER — Other Ambulatory Visit (HOSPITAL_COMMUNITY): Payer: Self-pay

## 2023-12-06 DIAGNOSIS — M47816 Spondylosis without myelopathy or radiculopathy, lumbar region: Secondary | ICD-10-CM | POA: Diagnosis not present

## 2023-12-06 DIAGNOSIS — M25512 Pain in left shoulder: Secondary | ICD-10-CM | POA: Diagnosis not present

## 2023-12-06 DIAGNOSIS — M40204 Unspecified kyphosis, thoracic region: Secondary | ICD-10-CM | POA: Diagnosis not present

## 2023-12-06 DIAGNOSIS — G894 Chronic pain syndrome: Secondary | ICD-10-CM | POA: Diagnosis not present

## 2023-12-06 MED ORDER — FENTANYL 25 MCG/HR TD PT72
1.0000 | MEDICATED_PATCH | TRANSDERMAL | 0 refills | Status: AC
Start: 2023-12-19 — End: ?
  Filled 2023-12-19: qty 15, 30d supply, fill #0

## 2023-12-06 MED ORDER — OXYCODONE-ACETAMINOPHEN 5-325 MG PO TABS
1.0000 | ORAL_TABLET | Freq: Four times a day (QID) | ORAL | 0 refills | Status: AC | PRN
Start: 2024-01-24 — End: ?
  Filled 2024-01-24: qty 120, 30d supply, fill #0

## 2023-12-06 MED ORDER — OXYCODONE-ACETAMINOPHEN 5-325 MG PO TABS
1.0000 | ORAL_TABLET | Freq: Four times a day (QID) | ORAL | 0 refills | Status: AC | PRN
Start: 2023-12-26 — End: ?
  Filled 2023-12-26: qty 120, 30d supply, fill #0

## 2023-12-06 MED ORDER — FENTANYL 25 MCG/HR TD PT72
1.0000 | MEDICATED_PATCH | TRANSDERMAL | 0 refills | Status: AC
Start: 2024-01-17 — End: ?
  Filled 2024-01-18: qty 15, 30d supply, fill #0

## 2023-12-07 ENCOUNTER — Other Ambulatory Visit (HOSPITAL_COMMUNITY): Payer: Self-pay

## 2023-12-08 DIAGNOSIS — M545 Low back pain, unspecified: Secondary | ICD-10-CM | POA: Diagnosis not present

## 2023-12-08 DIAGNOSIS — S93491A Sprain of other ligament of right ankle, initial encounter: Secondary | ICD-10-CM | POA: Diagnosis not present

## 2023-12-12 ENCOUNTER — Encounter: Payer: Self-pay | Admitting: Professional Counselor

## 2023-12-12 ENCOUNTER — Ambulatory Visit (INDEPENDENT_AMBULATORY_CARE_PROVIDER_SITE_OTHER): Admitting: Professional Counselor

## 2023-12-12 DIAGNOSIS — F330 Major depressive disorder, recurrent, mild: Secondary | ICD-10-CM

## 2023-12-12 DIAGNOSIS — F411 Generalized anxiety disorder: Secondary | ICD-10-CM | POA: Diagnosis not present

## 2023-12-12 NOTE — Progress Notes (Signed)
      [facility] Counselor/Therapist Progress Note  Patient ID: Shelley Vasquez, MRN: [id number],    Date: 12/12/2023  Time Spent: 1:12 PM to 2:10 PM  Treatment Type: Individual Therapy  Reported Symptoms: Fatigue, intermittent sense of overwhelm, stress, worries, caregiver strain, somatic concerns (pain, soreness per recent fall)  Mental Status Exam:  Appearance:   Neat     Behavior:  Appropriate, Sharing, and Motivated  Motor:  In wheelchair per recent fall  Speech/Language:   Clear and Coherent and Normal Rate  Affect:  Appropriate and Congruent  Mood:  normal  Thought process:  normal  Thought content:    WNL  Sensory/Perceptual disturbances:    WNL  Orientation:  oriented to person, place, time/date, and situation  Attention:  Good  Concentration:  Good  Memory:  WNL  Fund of knowledge:   Good  Insight:    Good  Judgment:   Good  Impulse Control:  Good   Risk Assessment: Danger to Self:  No Self-injurious Behavior: No Danger to Others: No Duty to Warn:no Physical Aggression / Violence:No  Access to Firearms a concern: No  Gang Involvement:No   Subjective: Patient presented to session to address concerns of anxiety and depression.  She reported progress.  Patient reported to be crafting more and more, a primary coping skill dear to her heart.  She reported her and her husband's diet to be good as a result of a class they are taking per his doctor's recommendation after health concerns.  She reported gratitude that her sister is doing well.  Patient also reported having seen her prescribing provider and for her medications to be stabilized.  Counselor actively listened, affirmed patient feelings experience and progress, and helped to reinforce strengths including hopefulness and positivity, and use of coping skills.  Patient reported having fallen since last session, spraining her ankle and hurting her side body and knees, and processed experience of her husband being upset  and her experience of being strong and courageous during that time.  Patient voiced gratitude that patient recovered and was healing and reinforced making it a priority to rest and take care of herself.  Interventions: Solution-Oriented/Positive Psychology, Humanistic/Existential, and Insight-Oriented  Diagnosis:   ICD-10-CM   1. Generalized anxiety disorder  F41.1     2. Major depressive disorder, recurrent episode, mild (HCC)  F33.0       Plan: Patient is scheduled for a follow-up; continue process work and developing coping skills.  STG between sessions to continue increase in crafting activities so as to boost morale and provide creative coping outlet.  Continue to observe self-care goals including nutrition mindfulness, and prioritize rest and healing.  Almarie ONEIDA Sprang, [facility]

## 2023-12-19 ENCOUNTER — Other Ambulatory Visit (HOSPITAL_COMMUNITY): Payer: Self-pay

## 2023-12-21 ENCOUNTER — Other Ambulatory Visit (HOSPITAL_COMMUNITY): Payer: Self-pay

## 2023-12-25 ENCOUNTER — Other Ambulatory Visit (HOSPITAL_COMMUNITY): Payer: Self-pay

## 2023-12-26 ENCOUNTER — Other Ambulatory Visit (HOSPITAL_COMMUNITY): Payer: Self-pay

## 2023-12-28 ENCOUNTER — Encounter: Payer: Self-pay | Admitting: Professional Counselor

## 2023-12-28 ENCOUNTER — Ambulatory Visit (INDEPENDENT_AMBULATORY_CARE_PROVIDER_SITE_OTHER): Admitting: Professional Counselor

## 2023-12-28 DIAGNOSIS — F411 Generalized anxiety disorder: Secondary | ICD-10-CM | POA: Diagnosis not present

## 2023-12-28 NOTE — Progress Notes (Signed)
      [facility] Counselor/Therapist Progress Note  Patient ID: Shelley Vasquez, MRN: [id number],    Date: 12/28/2023  Time Spent: 2:12 PM to 3:08 PM  Treatment Type: Individual Therapy  Reported Symptoms: Worries, fatigue, mild intermittent low mood, health concerns  Mental Status Exam:  Appearance:   Neat     Behavior:  Appropriate and Sharing, Motivated  Motor:  Normal  Speech/Language:   Clear and Coherent and Normal Rate  Affect:  Appropriate and Congruent  Mood:  normal  Thought process:  normal  Thought content:    WNL  Sensory/Perceptual disturbances:    WNL  Orientation:  oriented to person, place, time/date, and situation  Attention:  Good  Concentration:  Good  Memory:  WNL  Fund of knowledge:   Good  Insight:    Good  Judgment:   Good  Impulse Control:  Good   Risk Assessment: Danger to Self:  No Self-injurious Behavior: No Danger to Others: No Duty to Warn:no Physical Aggression / Violence:No  Access to Firearms a concern: No  Gang Involvement:No   Subjective: Patient presented to session to address concerns of anxiety.  She reported progress at this time.  She identified continuing to have worries and fatigue however for overall depression symptoms to have improved.  She reported classes with her husband regarding diet and other healthy lifestyle choices to be going well, and improving both of their morale around his health concerns and both of their sense of wellbeing and longevity.  Patient identified continuing to look into retirement community plans, and for this to help with anxiety.  She reported having continued to pick up her crafting again, indicative of improvement.  Counselor actively listened, affirmed patient's proactive efforts and progress, helped to reinforce positive coping mechanisms and patient strengths.  Interventions: Humanistic/Existential and Insight-Oriented, Positive/Strength-Based Psychology  Diagnosis:   ICD-10-CM   1.  Generalized anxiety disorder  F41.1       Plan: Patient is scheduled for follow-up; continue process work and developing coping skills.  STG between sessions to continue to discern regarding retirement community options, continue creative outlet of crafting as healthy coping mechanism.  Almarie ONEIDA Sprang, [facility]

## 2024-01-09 ENCOUNTER — Encounter: Payer: Self-pay | Admitting: Professional Counselor

## 2024-01-09 ENCOUNTER — Ambulatory Visit (INDEPENDENT_AMBULATORY_CARE_PROVIDER_SITE_OTHER): Admitting: Professional Counselor

## 2024-01-09 DIAGNOSIS — F411 Generalized anxiety disorder: Secondary | ICD-10-CM | POA: Diagnosis not present

## 2024-01-09 DIAGNOSIS — F330 Major depressive disorder, recurrent, mild: Secondary | ICD-10-CM

## 2024-01-09 NOTE — Progress Notes (Signed)
      [facility] Counselor/Therapist Progress Note  Patient ID: Shelley Vasquez, MRN: [id number],    Date: 01/09/2024  Time Spent: 1:08 PM to 2:11 PM  Treatment Type: Individual Therapy  Reported Symptoms: worries, fatigue, anxiousness, interpersonal concerns, caregiver strain, health concerns  Mental Status Exam:  Appearance:   Neat     Behavior:  Appropriate, Sharing, and Motivated  Motor:  Normal  Speech/Language:   Clear and Coherent and Normal Rate  Affect:  Appropriate and Congruent  Mood:  normal  Thought process:  normal  Thought content:    WNL  Sensory/Perceptual disturbances:    WNL  Orientation:  oriented to person, place, time/date, and situation  Attention:  Good  Concentration:  Good  Memory:  WNL  Fund of knowledge:   Good  Insight:    Good  Judgment:   Good  Impulse Control:  Good   Risk Assessment: Danger to Self:  No Self-injurious Behavior: No Danger to Others: No Duty to Warn:no Physical Aggression / Violence:No  Access to Firearms a concern: No  Gang Involvement:No   Subjective: Patient presented to session to address concerns of anxiety and depression.  She reported progress at this time.  She reported to be experiencing fatigue across fears of life, and for the class she and her husband are attending around diet and other lifestyle choices helpful to his health as tiring but she identified enjoying what they are learning, and for it to be helpful.  Counselor affirmed patient feelings and experience and reinforced patient positivity and proactive efforts toward health.  Patient processed experience of her spirituality including sharing regarding recent convention and her joy of enriching her faith and communing with her congregation.  Counselor actively listened and reinforced patient resourcing of spirituality as positive coping mechanism.  Patient processed experience of worry regarding her sister, and counselor helped facilitate insight into patient  concerns.  Interventions: Humanistic/Existential and Insight-Oriented  Diagnosis:   ICD-10-CM   1. Generalized anxiety disorder  F41.1     2. Major depressive disorder, recurrent episode, mild (HCC)  F33.0       Plan: Patient is scheduled for follow-up; continue process work and developing coping skills.  STG between sessions to continue to nutrition classes with husband, while pacing and moderating activities outside of restorative endeavors.  Shelley Vasquez, [facility]

## 2024-01-10 ENCOUNTER — Other Ambulatory Visit: Payer: Self-pay

## 2024-01-15 ENCOUNTER — Other Ambulatory Visit (HOSPITAL_COMMUNITY): Payer: Self-pay

## 2024-01-16 ENCOUNTER — Other Ambulatory Visit (HOSPITAL_COMMUNITY): Payer: Self-pay

## 2024-01-16 ENCOUNTER — Other Ambulatory Visit: Payer: Self-pay

## 2024-01-17 ENCOUNTER — Other Ambulatory Visit (HOSPITAL_COMMUNITY): Payer: Self-pay

## 2024-01-18 ENCOUNTER — Other Ambulatory Visit (HOSPITAL_COMMUNITY): Payer: Self-pay

## 2024-01-18 ENCOUNTER — Other Ambulatory Visit: Payer: Self-pay

## 2024-01-22 ENCOUNTER — Other Ambulatory Visit (HOSPITAL_COMMUNITY): Payer: Self-pay

## 2024-01-23 ENCOUNTER — Other Ambulatory Visit (HOSPITAL_COMMUNITY): Payer: Self-pay

## 2024-01-24 ENCOUNTER — Other Ambulatory Visit: Payer: Self-pay

## 2024-01-24 ENCOUNTER — Other Ambulatory Visit (HOSPITAL_COMMUNITY): Payer: Self-pay

## 2024-01-25 DIAGNOSIS — I10 Essential (primary) hypertension: Secondary | ICD-10-CM | POA: Diagnosis not present

## 2024-01-25 DIAGNOSIS — F319 Bipolar disorder, unspecified: Secondary | ICD-10-CM | POA: Diagnosis not present

## 2024-01-25 DIAGNOSIS — N952 Postmenopausal atrophic vaginitis: Secondary | ICD-10-CM | POA: Diagnosis not present

## 2024-01-25 DIAGNOSIS — E785 Hyperlipidemia, unspecified: Secondary | ICD-10-CM | POA: Diagnosis not present

## 2024-01-25 DIAGNOSIS — M25512 Pain in left shoulder: Secondary | ICD-10-CM | POA: Diagnosis not present

## 2024-01-29 ENCOUNTER — Telehealth: Payer: Self-pay

## 2024-01-29 NOTE — Telephone Encounter (Signed)
 Left message about the PREP program and asked to return my call.

## 2024-01-29 NOTE — Telephone Encounter (Signed)
 She called to inquire about taking PREP program with her husband; they would both like to attend at [facility], next class on 8/19 if possible; she will get referral from her PCP, Dr. Teresa

## 2024-01-31 ENCOUNTER — Telehealth: Payer: Self-pay

## 2024-01-31 NOTE — Telephone Encounter (Signed)
 Shelley Vasquez called about joining class at [facility] on August 19th and setting up her and her husband's initial assessment tomorrow.

## 2024-02-01 ENCOUNTER — Other Ambulatory Visit (HOSPITAL_COMMUNITY): Payer: Self-pay

## 2024-02-01 ENCOUNTER — Ambulatory Visit: Admitting: Professional Counselor

## 2024-02-01 DIAGNOSIS — M25512 Pain in left shoulder: Secondary | ICD-10-CM | POA: Diagnosis not present

## 2024-02-01 DIAGNOSIS — M47816 Spondylosis without myelopathy or radiculopathy, lumbar region: Secondary | ICD-10-CM | POA: Diagnosis not present

## 2024-02-01 DIAGNOSIS — G894 Chronic pain syndrome: Secondary | ICD-10-CM | POA: Diagnosis not present

## 2024-02-01 DIAGNOSIS — M40204 Unspecified kyphosis, thoracic region: Secondary | ICD-10-CM | POA: Diagnosis not present

## 2024-02-01 MED ORDER — FENTANYL 25 MCG/HR TD PT72
1.0000 | MEDICATED_PATCH | TRANSDERMAL | 0 refills | Status: AC
  Filled 2024-02-16: qty 15, 30d supply, fill #0

## 2024-02-01 MED ORDER — OXYCODONE-ACETAMINOPHEN 5-325 MG PO TABS
1.0000 | ORAL_TABLET | Freq: Four times a day (QID) | ORAL | 0 refills | Status: AC | PRN
  Filled 2024-02-22: qty 120, 30d supply, fill #0

## 2024-02-01 MED ORDER — FENTANYL 25 MCG/HR TD PT72
1.0000 | MEDICATED_PATCH | TRANSDERMAL | 0 refills | Status: AC
  Filled 2024-03-16: qty 15, 30d supply, fill #0

## 2024-02-01 MED ORDER — OXYCODONE-ACETAMINOPHEN 5-325 MG PO TABS
1.0000 | ORAL_TABLET | Freq: Four times a day (QID) | ORAL | 0 refills | Status: AC | PRN
  Filled 2024-03-22: qty 120, 30d supply, fill #0

## 2024-02-01 NOTE — Progress Notes (Signed)
 [facility] Evaluation  Patient Details  Name: Shelley Vasquez MRN: [id number] Date of Birth: Oct 13, 1965 Age: 58 y.o. PCP: Teresa Channel, MD  Vitals:   02/01/24 1512  BP: 118/73  Pulse: 60  SpO2: 96%  Weight: 172 lb 12.8 oz (78.4 kg)     [facility] Eval - 02/01/24 1500       [facility] PREP Location   [facility] PRE[facility] Location [facility]      Referral    Program Start Date 02/06/24      Measurement   Waist Circumference 35.25 inches    Hip Circumference 42 inches    Body fat 37.6 percent      Information for Trainer   Goals --   Learn about healthy eating   Current Exercise --   Water exercises and walking     Mobility and Daily Activities   I find it easy to walk up or down two or more flights of stairs. 1    I have no trouble taking out the trash. 1    I do housework such as vacuuming and dusting on my own without difficulty. 3    I can easily lift a gallon of milk (8lbs). 3    I can easily walk a mile. 1    I have no trouble reaching into high cupboards or reaching down to pick up something from the floor. 2    I do not have trouble doing out-door work such as Loss adjuster, chartered, raking leaves, or gardening. 1      Mobility and Daily Activities   I feel younger than my age. 1    I feel independent. 1    I feel energetic. 1    I live an active life.  1    I feel strong. 1    I feel healthy. 2    I feel active as other people my age. 1      How fit and strong are you.   Fit and Strong Total Score 20         Past Medical History:  Diagnosis Date   Anxiety    Arthritis    Bipolar disorder (HCC)    Depression    Hypertension    Sleep apnea    does not use CPAP   Past Surgical History:  Procedure Laterality Date   ABDOMINAL HYSTERECTOMY     10/2010   ANTERIOR LAT LUMBAR FUSION Left 02/12/2018   Procedure: Lumbar Two - Three Lumbar Three - Four Anterolateral lumbar decompression/fusion with lateral plate;  Surgeon: Colon Shove, MD;  Location: [facility];  Service:  Neurosurgery;  Laterality: Left;  Lumbar Two - Three Lumbar Three - Four Anterolateral lumbar decompression/fusion with lateral plate   BACK SURGERY     2011 lumbar 4-5 fusion   BIOPSY  06/26/2018   Procedure: BIOPSY;  Surgeon: Saintclair Jasper, MD;  Location: [facility];  Service: Gastroenterology;;  EGD and Colon   BREAST SURGERY  01/2010   breast reduction   CHOLECYSTECTOMY N/A 05/08/2019   Procedure: LAPAROSCOPIC CHOLECYSTECTOMY WITH INTRAOPERATIVE CHOLANGIOGRAM;  Surgeon: Vernetta Berg, MD;  Location: [facility] OR;  Service: General;  Laterality: N/A;   COLONOSCOPY N/A 06/26/2018   Procedure: COLONOSCOPY;  Surgeon: Saintclair Jasper, MD;  Location: [facility];  Service: Gastroenterology;  Laterality: N/A;   ESOPHAGOGASTRODUODENOSCOPY N/A 06/26/2018   Procedure: ESOPHAGOGASTRODUODENOSCOPY (EGD);  Surgeon: Saintclair Jasper, MD;  Location: [facility];  Service: Gastroenterology;  Laterality: N/A;   left knee arthroscopic Bilateral  2016 bilateral   REDUCTION MAMMAPLASTY Bilateral    TOE SURGERY     Social History   Tobacco Use  Smoking Status Never  Smokeless Tobacco Never  Asami is ready to start PREP on August 19th.  Suzen Ash 02/01/2024, 3:15 PM

## 2024-02-06 NOTE — Progress Notes (Signed)
 [facility] Weekly Session  Patient Details  Name: Shelley Vasquez MRN: [id number] Date of Birth: 1966-01-29 Age: 58 y.o. PCP: Teresa Channel, MD  Vitals:   02/06/24 1559  Weight: 172 lb 9.6 oz (78.3 kg)     [facility] Weekly seesion - 02/06/24 1500       [facility] PREP Location   [facility] PREP Location [facility]      Weekly Session   Topic Discussed Goal setting and welcome to the program    Classes attended to date 1          [facility] 02/06/2024, 4:00 PM

## 2024-02-13 ENCOUNTER — Other Ambulatory Visit (HOSPITAL_COMMUNITY): Payer: Self-pay

## 2024-02-13 NOTE — Progress Notes (Signed)
 [facility] PRE[facility] Weekly Session  Patient Details  Name: Shelley Vasquez MRN: [id number] Date of Birth: 1965/07/07 Age: 58 y.o. PCP: Teresa Channel, MD  Vitals:   02/13/24 1518  Weight: 169 lb (76.7 kg)     [facility] Weekly seesion - 02/13/24 1500       [facility] PREP Location   [facility] PREP Location [facility]      Weekly Session   Topic Discussed Other ways to be active   Talked about the difference between cardio, strength training, and stretching and recommended time amounts.   Classes attended to date 3          Suzen Ash 02/13/2024, 3:18 PM

## 2024-02-14 ENCOUNTER — Other Ambulatory Visit (HOSPITAL_COMMUNITY): Payer: Self-pay

## 2024-02-15 ENCOUNTER — Ambulatory Visit: Admitting: Professional Counselor

## 2024-02-15 ENCOUNTER — Other Ambulatory Visit (HOSPITAL_COMMUNITY): Payer: Self-pay

## 2024-02-16 ENCOUNTER — Other Ambulatory Visit (HOSPITAL_COMMUNITY): Payer: Self-pay

## 2024-02-20 ENCOUNTER — Other Ambulatory Visit (HOSPITAL_COMMUNITY): Payer: Self-pay

## 2024-02-20 NOTE — Progress Notes (Signed)
 [facility] PRE[facility] Weekly Session  Patient Details  Name: Shelley Vasquez MRN: [id number] Date of Birth: September 27, 1965 Age: 58 y.o. PCP: Teresa Channel, MD  Vitals:   02/20/24 1554  Weight: 173 lb (78.5 kg)     [facility] Weekly seesion - 02/20/24 1500       [facility] PREP Location   [facility] PRE[facility] Location [facility]      Weekly Session   Topic Discussed Healthy eating tips   Talked about foods to increase and avoid including fats and sugar. Went over [employer] exercised this week 195 minutes    Classes attended to date 5          Suzen Ash 02/20/2024, 3:54 PM

## 2024-02-21 ENCOUNTER — Other Ambulatory Visit (HOSPITAL_COMMUNITY): Payer: Self-pay

## 2024-02-22 ENCOUNTER — Other Ambulatory Visit (HOSPITAL_COMMUNITY): Payer: Self-pay

## 2024-02-27 ENCOUNTER — Ambulatory Visit: Admitting: Professional Counselor

## 2024-03-01 ENCOUNTER — Other Ambulatory Visit: Payer: Self-pay

## 2024-03-05 NOTE — Progress Notes (Signed)
 [facility] PRE[facility] Weekly Session  Patient Details  Name: Shelley Vasquez MRN: [id number] Date of Birth: May 13, 1966 Age: 58 y.o. PCP: Teresa Channel, MD  Vitals:   03/05/24 1600  Weight: 177 lb 4.8 oz (80.4 kg)     [facility] Weekly seesion - 03/05/24 1600       [facility] PREP Location   [facility] PREP Location [facility]      Weekly Session   Topic Discussed Health habits   Talked about healthy tips and habits, sugar demo   Minutes exercised this week 240 minutes    Classes attended to date 7          [facility] 03/05/2024, 4:01 PM

## 2024-03-06 ENCOUNTER — Emergency Department (HOSPITAL_BASED_OUTPATIENT_CLINIC_OR_DEPARTMENT_OTHER)
Admission: EM | Admit: 2024-03-06 | Discharge: 2024-03-06 | Disposition: A | Discharge status: Home / Self Care | Source: Ambulatory Visit | Attending: Emergency Medicine | Admitting: Emergency Medicine

## 2024-03-06 ENCOUNTER — Emergency Department (HOSPITAL_BASED_OUTPATIENT_CLINIC_OR_DEPARTMENT_OTHER)

## 2024-03-06 ENCOUNTER — Other Ambulatory Visit: Payer: Self-pay

## 2024-03-06 DIAGNOSIS — M7989 Other specified soft tissue disorders: Secondary | ICD-10-CM | POA: Diagnosis not present

## 2024-03-06 DIAGNOSIS — M79605 Pain in left leg: Secondary | ICD-10-CM | POA: Diagnosis not present

## 2024-03-06 DIAGNOSIS — R600 Localized edema: Secondary | ICD-10-CM | POA: Diagnosis not present

## 2024-03-06 NOTE — ED Provider Notes (Signed)
 [place] EMERGENCY DEPARTMENT AT [facility] Provider Note   CSN: [id number] Arrival date & time: 03/06/24  1145     Patient presents with: Leg Swelling   Shelley Vasquez is a 58 y.o. female.   58 yo F with a chief complaints of left leg pain and swelling.  This been going on for a few days now.  She recently went on a driving trip that lasted for about 6 hours.  They did get out frequently and walk.  Went to see her primary care provider urgent clinic today and they were worried she had a DVT and sent her here for evaluation.  She denies any chest pain or difficulty breathing.        Prior to Admission medications   Medication Sig Start Date End Date Taking? Authorizing Provider  Baclofen  5 MG TABS Take 1 tablet (5 mg total) by mouth at bedtime as needed. Stop Tizanidine . 09/12/22     betamethasone  valerate (VALISONE ) 0.1 % cream Apply 1 application topically See admin instructions. Up to twice a week as needed for vaginal dryness.    [provider]  clonazePAM  (KLONOPIN ) 1 MG tablet Take 1 mg by mouth See admin instructions. Take 1 tablet (1 mg) by mouth midday if needed for anxiety & take up 2 tablets (2 mg) by mouth at bedtime as needed for sleep.    [provider]  docusate sodium  (COLACE) 100 MG capsule Take 400 mg by mouth 2 (two) times daily.    [provider]  estradiol (ESTRACE) 0.1 MG/GM vaginal cream Place 1 Applicatorful vaginally See admin instructions.    [provider]  fentaNYL  (DURAGESIC  - DOSED MCG/HR) 25 MCG/HR patch Place 25 mcg onto the skin every other day.    [provider]  fentaNYL  (DURAGESIC ) 25 MCG/HR Place 1 patch onto the skin every other day as needed (fill 08/19/22) 08/19/22     fentaNYL  (DURAGESIC ) 25 MCG/HR Place 1 patch onto the skin every other day (every 48 hours) 07/21/22     fentaNYL  (DURAGESIC ) 25 MCG/HR Place 1 patch on skin every 48 hours. 09/16/22     fentaNYL  (DURAGESIC ) 25 MCG/HR Place 1  patch to skin every 48 hours. 11/07/22     fentaNYL  (DURAGESIC ) 25 MCG/HR Place 1 patch to skin every 48 hours 11/07/22     fentaNYL  (DURAGESIC ) 25 MCG/HR Apply 1 patch to skin every 48 hours. 02/04/23     fentaNYL  (DURAGESIC ) 25 MCG/HR Apply 1 patch to skin every 48 hours please 01/06/23 01/06/23     fentaNYL  (DURAGESIC ) 25 MCG/HR Place 1 patch onto the skin every other day. 04/01/23     fentaNYL  (DURAGESIC ) 25 MCG/HR Place 1 patch onto the skin every other day. 03/04/23     fentaNYL  (DURAGESIC ) 25 MCG/HR Place 1 patch onto the skin every other day. 05/03/23     fentaNYL  (DURAGESIC ) 25 MCG/HR Place 1 patch onto the skin every other day. 05/31/23     fentaNYL  (DURAGESIC ) 25 MCG/HR Place 1 patch onto the skin every 48 hours --fill with Mylan brand 07/20/23 07/20/23     fentaNYL  (DURAGESIC ) 25 MCG/HR Place 1 patch onto the skin every 48 hours 06/22/23     fentaNYL  (DURAGESIC ) 25 MCG/HR Place 1 patch onto the skin every 48 hours. 09/23/23     fentaNYL  (DURAGESIC ) 25 MCG/HR Place 1 patch onto the skin every 48 hours. 08/25/23     fentaNYL  (DURAGESIC ) 25 MCG/HR Place 1 patch onto the skin every  48 hours. 11/20/23     fentaNYL  (DURAGESIC ) 25 MCG/HR Place 1 patch onto the skin every other day. 10/21/23     fentaNYL  (DURAGESIC ) 25 MCG/HR Place 1 patch onto the skin every other day. 12/19/23     fentaNYL  (DURAGESIC ) 25 MCG/HR Place 1 patch onto the skin every other day. 01/17/24     fentaNYL  (DURAGESIC ) 25 MCG/HR Place 1 patch onto the skin every other day (every 48 hours) 02/16/24     fentaNYL  (DURAGESIC ) 25 MCG/HR Place 1 patch onto the skin every other day. (every 48 hours) 03/16/24     guanFACINE  (INTUNIV ) 2 MG TB24 ER tablet Take 2 mg by mouth every morning.    [provider]  guanFACINE  (INTUNIV ) 4 MG TB24 ER tablet Take 4 mg by mouth daily.    [provider]  hyoscyamine  (LEVSIN  SL) 0.125 MG SL tablet Place 0.125 mg under the tongue every 4 (four) hours as needed for cramping (difficulty  swallowing.).     [provider]  lamoTRIgine  (LAMICTAL ) 200 MG tablet Take 400 mg by mouth at bedtime.    [provider]  Magnesium  250 MG TABS Take 250-500 mg by mouth 2 (two) times daily as needed (constipation).     [provider]  MAGNESIUM  CITRATE PO Take 1 spray by mouth 3 (three) times daily as needed (for muscle cramps).    [provider]  naloxone  (NARCAN ) nasal spray 4 mg/0.1 mL Place 1-3 sprays into one nostril as directed as needed. Use as needed for potential overdose 1-2 sprays to nose alternating nostril every 3 minutes until EMS arrives or awake 08/15/23     ondansetron  (ZOFRAN -ODT) 8 MG disintegrating tablet Take 8 mg by mouth every 8 (eight) hours as needed for nausea or vomiting.    [provider]  OVER THE COUNTER MEDICATION Take 0.5 tablets by mouth 2 (two) times daily. Avocado SoyBean    [provider]  oxyCODONE -acetaminophen  (PERCOCET/ROXICET) 5-325 MG tablet Take 1 tablet by mouth 4 (four) times daily.    [provider]  oxyCODONE -acetaminophen  (PERCOCET/ROXICET) 5-325 MG tablet Take 1 tablet by mouth every 6 (six) hours as needed for pain (fill 08/15/22) 08/15/22     oxyCODONE -acetaminophen  (PERCOCET/ROXICET) 5-325 MG tablet Take 1 tablet by mouth every 6 (six) hours as needed for pain (fill 09/13/22) 09/13/22     oxyCODONE -acetaminophen  (PERCOCET/ROXICET) 5-325 MG tablet Take 1 tablet by mouth every 6 (six) hours as needed for pain 10/12/22     oxyCODONE -acetaminophen  (PERCOCET/ROXICET) 5-325 MG tablet Take 1 tablet by mouth every 6 (six) hours as needed for pain. 09/13/22     oxyCODONE -acetaminophen  (PERCOCET/ROXICET) 5-325 MG tablet Take 1 tablet by mouth every 6 hours as needed for pain 12/08/22     oxyCODONE -acetaminophen  (PERCOCET/ROXICET) 5-325 MG tablet Take 1 tablet by mouth every six hours as needed for pain 11/09/22     oxyCODONE -acetaminophen  (PERCOCET/ROXICET) 5-325 MG tablet Take 1 tablet by mouth  every 6 hours as needed for pain. 02/04/23     oxyCODONE -acetaminophen  (PERCOCET/ROXICET) 5-325 MG tablet Take 1 tablet by mouth every 6 hours as needed for pain mallinkrodt generic if available 01/06/23 01/06/23     oxyCODONE -acetaminophen  (PERCOCET/ROXICET) 5-325 MG tablet Take 1 tablet by mouth every 6 (six) hours as needed for pain. 03/04/23     oxyCODONE -acetaminophen  (PERCOCET/ROXICET) 5-325 MG tablet Take 1 tablet by mouth every 6 (six) hours as needed for pain. 04/01/23     oxyCODONE -acetaminophen  (PERCOCET/ROXICET) 5-325 MG tablet Take 1 tablet  by mouth every 6 (six) hours as needed for pain 05/03/23     oxyCODONE -acetaminophen  (PERCOCET/ROXICET) 5-325 MG tablet Take 1 tablet by mouth every 6 (six) hours as needed for pain 05/31/23     oxyCODONE -acetaminophen  (PERCOCET/ROXICET) 5-325 MG tablet Take 1 tablet by mouth every 6 (six) hours as needed for pain 07/20/23 07/20/23     oxyCODONE -acetaminophen  (PERCOCET/ROXICET) 5-325 MG tablet Take 1 tablet by mouth every 6 (six) hours as needed. 06/22/23     oxyCODONE -acetaminophen  (PERCOCET/ROXICET) 5-325 MG tablet Take 1 tablet by mouth every 6 (six) hours as needed for pain. 09/27/23     oxyCODONE -acetaminophen  (PERCOCET/ROXICET) 5-325 MG tablet Take 1 tablet by mouth every 6 (six) hours as needed for pain. 08/29/23     oxyCODONE -acetaminophen  (PERCOCET/ROXICET) 5-325 MG tablet Take 1 tablet by mouth every 6 (six) hours as needed for pain. 11/24/23     oxyCODONE -acetaminophen  (PERCOCET/ROXICET) 5-325 MG tablet Take 1 tablet by mouth every 6 (six) hours as needed. 10/26/23     oxyCODONE -acetaminophen  (PERCOCET/ROXICET) 5-325 MG tablet Take 1 tablet by mouth every 6 (six) hours as needed for pain. 12/26/23     oxyCODONE -acetaminophen  (PERCOCET/ROXICET) 5-325 MG tablet Take 1 tablet by mouth every 6 (six) hours as needed for pain. 01/24/24     oxyCODONE -acetaminophen  (PERCOCET/ROXICET) 5-325 MG tablet Take 1 tablet by mouth every 6 (six) hours as needed for pain  03/22/24     oxyCODONE -acetaminophen  (PERCOCET/ROXICET) 5-325 MG tablet Take 1 tablet by mouth every 6 (six) hours as needed for pain 02/22/24     pantoprazole  (PROTONIX ) 40 MG tablet Take 40 mg by mouth 2 (two) times daily.    [provider]  pravastatin (PRAVACHOL) 40 MG tablet Take 40 mg by mouth daily.    [provider]  rizatriptan (MAXALT) 10 MG tablet Take 10 mg by mouth as needed for migraine. May repeat in 2 hours if needed    [provider]  tiZANidine  (ZANAFLEX ) 4 MG tablet Take 4-8 mg by mouth See admin instructions. Take 1 tablet (4 mg) by mouth in the morning & take 3 tablets by mouth at night.    [provider]  traZODone  (DESYREL ) 50 MG tablet Take 100 mg by mouth at bedtime.     [provider]  triamcinolone cream (KENALOG) 0.5 % Apply 1 application topically 2 (two) times daily as needed (for dry/cracking skin on hands.).     [provider]  triamterene -hydrochlorothiazide  (MAXZIDE -25) 37.5-25 MG tablet Take 0.5 tablets by mouth daily.    [provider]  VIIBRYD  20 MG TABS Take 1 tablet by mouth daily. 04/17/19   [provider]    Allergies: Nsaids, Prednisone, Prozac [fluoxetine hcl], Amoxicillin -pot clavulanate, Anesthesia s-i-40 [propofol ], Lithium, Wellbutrin [bupropion], Dilaudid [hydromorphone hcl], Escitalopram oxalate, and Zolpidem tartrate    Review of Systems  Updated Vital Signs BP 135/77 (BP Location: Right Arm)   Pulse (!) 44   Temp 97.8 F (36.6 C)   Resp 18   LMP 10/27/2010   SpO2 96%   Physical Exam Vitals and nursing note reviewed.  Constitutional:      General: She is not in acute distress.    Appearance: She is well-developed. She is not diaphoretic.  HENT:     Head: Normocephalic and atraumatic.  Eyes:     Pupils: Pupils are equal, round, and reactive to light.  Cardiovascular:     Rate and Rhythm: Normal rate and regular rhythm.     Heart sounds: No murmur heard.  No friction rub. No gallop.  Pulmonary:     Effort: Pulmonary effort is normal.     Breath sounds: No wheezing or rales.  Abdominal:     General: There is no distension.     Palpations: Abdomen is soft.     Tenderness: There is no abdominal tenderness.  Musculoskeletal:        General: No tenderness.     Cervical back: Normal range of motion and neck supple.     Comments: Left leg is perhaps mildly larger than the right.  Pulse motor and sensation are intact distally.  Skin:    General: Skin is warm and dry.  Neurological:     Mental Status: She is alert and oriented to person, place, and time.  Psychiatric:        Behavior: Behavior normal.     (all labs ordered are listed, but only abnormal results are displayed) Labs Reviewed - No data to display  EKG: None  Radiology: US  Venous Img Lower Unilateral Left Result Date: 03/06/2024 CLINICAL DATA:  Left lower extremity pain. EXAM: Left LOWER EXTREMITY VENOUS DOPPLER ULTRASOUND TECHNIQUE: Gray-scale sonography with compression, as well as color and duplex ultrasound, were performed to evaluate the deep venous system(s) from the level of the common femoral vein through the popliteal and proximal calf veins. COMPARISON:  None Available. FINDINGS: VENOUS Normal compressibility of the common femoral, superficial femoral, and popliteal veins, as well as the visualized calf veins. Visualized portions of profunda femoral vein and great saphenous vein unremarkable. No filling defects to suggest DVT on grayscale or color Doppler imaging. Doppler waveforms show normal direction of venous flow, normal respiratory plasticity and response to augmentation. Limited views of the contralateral common femoral vein are unremarkable. OTHER None. Limitations: none IMPRESSION: Negative. Electronically Signed   By: Vanetta Chou M.D.   On: 03/06/2024 13:43     Procedures   Medications Ordered in the ED - No data to display                                   Medical Decision Making  58 yo F with a chief complaints of left leg pain and swelling.  Saw her primary care urgent clinic and they recommended coming here to get a DVT study.  No symptoms consistent with PE.  Will obtain a DVT study here.  Ultrasound is negative for DVT.  Will discharge home.  PCP follow-up.  2:48 PM:  I have discussed the diagnosis/risks/treatment options with the patient.  Evaluation and diagnostic testing in the emergency department does not suggest an emergent condition requiring admission or immediate intervention beyond what has been performed at this time.  They will follow up with PCP. We also discussed returning to the ED immediately if new or worsening sx occur. We discussed the sx which are most concerning (e.g., sudden worsening pain, fever, inability to tolerate by mouth) that necessitate immediate return. Medications administered to the patient during their visit and any new prescriptions provided to the patient are listed below.  Medications given during this visit Medications - No data to display   The patient appears reasonably screen and/or stabilized for discharge and I doubt any other medical condition or other [facility] requiring further screening, evaluation, or treatment in the ED at this time prior to discharge.       Final diagnoses:  Leg swelling    ED Discharge Orders  None          Emil Share, DO 03/06/24 1448

## 2024-03-06 NOTE — Discharge Instructions (Signed)
 Your ultrasound looked okay, as we discussed please return for chest pain difficulty breathing.  Your doctor may want to repeat the ultrasound in a week or 2 if your symptoms persist.

## 2024-03-06 NOTE — ED Triage Notes (Signed)
 C/o left leg swelling since Monday. Not obvious in triage. Denies injury. Denies thinners.

## 2024-03-12 ENCOUNTER — Ambulatory Visit: Admitting: Professional Counselor

## 2024-03-12 NOTE — Progress Notes (Signed)
 [facility] PRE[facility] Weekly Session  Patient Details  Name: Shelley Vasquez MRN: [id number] Date of Birth: 10/20/1965 Age: 58 y.o. PCP: Teresa Channel, MD  Vitals:   03/12/24 1536  Weight: 172 lb 9.6 oz (78.3 kg)     [facility] Weekly seesion - 03/12/24 1500       [facility] PREP Location   [facility] PREP Location [facility]      Weekly Session   Topic Discussed Restaurant Eating   Discussed tips for eating out and sodium talk   Minutes exercised this week 270 minutes    Classes attended to date [address] 03/12/2024, 3:37 PM

## 2024-03-13 ENCOUNTER — Other Ambulatory Visit (HOSPITAL_COMMUNITY): Payer: Self-pay

## 2024-03-14 ENCOUNTER — Other Ambulatory Visit (HOSPITAL_COMMUNITY): Payer: Self-pay

## 2024-03-15 ENCOUNTER — Other Ambulatory Visit (HOSPITAL_COMMUNITY): Payer: Self-pay

## 2024-03-16 ENCOUNTER — Other Ambulatory Visit (HOSPITAL_COMMUNITY): Payer: Self-pay

## 2024-03-18 ENCOUNTER — Other Ambulatory Visit (HOSPITAL_COMMUNITY): Payer: Self-pay

## 2024-03-19 NOTE — Progress Notes (Signed)
 [facility] PREP Weekly Session  Patient Details  Name: Shelley Vasquez MRN: [id number] Date of Birth: 1966/04/26 Age: 58 y.o. PCP: Teresa Channel, MD  Vitals:   03/19/24 1529  Weight: 171 lb 9.6 oz (77.8 kg)     [facility] Weekly seesion - 03/19/24 1500       [facility] PREP Location   [facility] PREP Location [facility]      Weekly Session   Topic Discussed Stress management and problem solving   Stress management and sleep tips   Minutes exercised this week 260 minutes    Classes attended to date [address] 03/19/2024, 3:30 PM

## 2024-03-20 ENCOUNTER — Other Ambulatory Visit (HOSPITAL_COMMUNITY): Payer: Self-pay

## 2024-03-21 ENCOUNTER — Other Ambulatory Visit (HOSPITAL_COMMUNITY): Payer: Self-pay

## 2024-03-22 ENCOUNTER — Other Ambulatory Visit (HOSPITAL_COMMUNITY): Payer: Self-pay

## 2024-03-22 ENCOUNTER — Other Ambulatory Visit: Payer: Self-pay

## 2024-03-26 ENCOUNTER — Ambulatory Visit: Admitting: Professional Counselor

## 2024-03-26 NOTE — Progress Notes (Signed)
 [facility] PREP Weekly Session  Patient Details  Name: Shelley Vasquez MRN: [id number] Date of Birth: October 11, 1965 Age: 58 y.o. PCP: Teresa Channel, MD  Vitals:   03/26/24 1524  Weight: 173 lb 9.6 oz (78.7 kg)     [facility] Weekly seesion - 03/26/24 1500       [facility] PREP Location   [facility] PREP Location [facility]      Weekly Session   Topic Discussed Expectations and non-scale victories   Check in on info we have learned, non scale victories and celebrations, how to keep going   Minutes exercised this week 190 minutes    Classes attended to date [address] 03/26/2024, 3:25 PM

## 2024-03-29 ENCOUNTER — Other Ambulatory Visit: Payer: Self-pay | Admitting: Family Medicine

## 2024-03-29 DIAGNOSIS — Z1231 Encounter for screening mammogram for malignant neoplasm of breast: Secondary | ICD-10-CM

## 2024-04-01 ENCOUNTER — Other Ambulatory Visit (HOSPITAL_COMMUNITY): Payer: Self-pay

## 2024-04-01 DIAGNOSIS — M40204 Unspecified kyphosis, thoracic region: Secondary | ICD-10-CM | POA: Diagnosis not present

## 2024-04-01 DIAGNOSIS — G894 Chronic pain syndrome: Secondary | ICD-10-CM | POA: Diagnosis not present

## 2024-04-01 DIAGNOSIS — M25512 Pain in left shoulder: Secondary | ICD-10-CM | POA: Diagnosis not present

## 2024-04-01 DIAGNOSIS — Z79891 Long term (current) use of opiate analgesic: Secondary | ICD-10-CM | POA: Diagnosis not present

## 2024-04-01 DIAGNOSIS — M47816 Spondylosis without myelopathy or radiculopathy, lumbar region: Secondary | ICD-10-CM | POA: Diagnosis not present

## 2024-04-01 MED ORDER — OXYCODONE-ACETAMINOPHEN 5-325 MG PO TABS
1.0000 | ORAL_TABLET | Freq: Four times a day (QID) | ORAL | 0 refills | Status: AC | PRN
  Filled 2024-04-20: qty 120, fill #0
  Filled 2024-04-20: qty 120, 30d supply, fill #0

## 2024-04-01 MED ORDER — FENTANYL 25 MCG/HR TD PT72
1.0000 | MEDICATED_PATCH | TRANSDERMAL | 0 refills | Status: AC
  Filled 2024-05-14: qty 15, 30d supply, fill #0
  Filled ????-??-??: fill #0

## 2024-04-01 MED ORDER — FENTANYL 25 MCG/HR TD PT72
1.0000 | MEDICATED_PATCH | TRANSDERMAL | 0 refills | Status: AC
  Filled 2024-04-13: qty 15, 30d supply, fill #0

## 2024-04-01 MED ORDER — OXYCODONE-ACETAMINOPHEN 5-325 MG PO TABS
1.0000 | ORAL_TABLET | Freq: Four times a day (QID) | ORAL | 0 refills | Status: AC | PRN
  Filled 2024-05-18: qty 18, 5d supply, fill #0
  Filled 2024-05-20: qty 102, 25d supply, fill #0
  Filled ????-??-??: fill #0

## 2024-04-02 NOTE — Progress Notes (Signed)
 [facility] PRE[facility] Weekly Session  Patient Details  Name: Shelley Vasquez MRN: [id number] Date of Birth: [birth year] Age: 58 y.o. PCP: Teresa Channel, MD  Vitals:   04/02/24 1547  Weight: 175 lb (79.4 kg)     [facility] Weekly seesion - 04/02/24 1500       [facility] PREP Location   [facility] PREP Location [facility]      Weekly Session   Topic Discussed Other   Talk about Portion size demo and craisins demo   Minutes exercised this week 300 minutes    Classes attended to date [address] 04/02/2024, 3:52 PM

## 2024-04-03 ENCOUNTER — Other Ambulatory Visit: Payer: Self-pay

## 2024-04-08 ENCOUNTER — Ambulatory Visit: Admitting: Professional Counselor

## 2024-04-08 ENCOUNTER — Encounter: Payer: Self-pay | Admitting: Professional Counselor

## 2024-04-08 DIAGNOSIS — F330 Major depressive disorder, recurrent, mild: Secondary | ICD-10-CM | POA: Diagnosis not present

## 2024-04-08 DIAGNOSIS — F411 Generalized anxiety disorder: Secondary | ICD-10-CM

## 2024-04-08 NOTE — Progress Notes (Signed)
 Since       [facility] Counselor/Therapist Progress Note  Patient ID: Shelley Vasquez, MRN: [id number],    Date: 04/08/2024  Time Spent: 3:06 PM to 4:05 PM  Treatment Type: Individual Therapy  Reported Symptoms: Tearfulness, caregiver strain, worries, stress, sadness, interpersonal concerns, intermittent low mood, phase of life concerns  Mental Status Exam:  Appearance:   Neat     Behavior:  Appropriate, Sharing, and Motivated  Motor:  Normal  Speech/Language:   Clear and Coherent and Normal Rate  Affect:  Tearful  Mood:  normal and some sadness  Thought process:  normal  Thought content:    WNL  Sensory/Perceptual disturbances:    WNL  Orientation:  oriented to person, place, time/date, and situation  Attention:  Good  Concentration:  Good  Memory:  WNL  Fund of knowledge:   Good  Insight:    Good  Judgment:   Good  Impulse Control:  Good   Risk Assessment: Danger to Self:  No Self-injurious Behavior: No Danger to Others: No Duty to Warn:no Physical Aggression / Violence:No  Access to Firearms a concern: No  Gang Involvement:No   Subjective: Patient presented to session to address concerns of anxiety and depression.  She reported mixed progress at this time.  She identified stress and worry around caregiving for her husband and his condition at this time.  She identified spiritual resourcing as a primary strength, and counselor reinforced patient healthy spirituality and resourcing.  Counselor and patient discussed strategies to ease difficulties in home life.  Counselor affirmed patient feelings and experience and strengths including resiliency.  Patient processed experience of a health scare with a family member, and other circumstances within her family system.  Patient identified desire to continue to include self-care and increase fun in life.  Counselor and patient discussed strategies to this end, and mindfulness practices.  Interventions: Solution-Oriented/Positive  Psychology, Humanistic/Existential, Insight-Oriented, and MBSR  Diagnosis:   ICD-10-CM   1. Generalized anxiety disorder  F41.1     2. Major depressive disorder, recurrent episode, mild  F33.0       Plan: Patient is scheduled for follow-up; continue process work and developing coping skills.  Patient short-term goal between sessions to practice mindful eating and mindful walking as discussed with counselor, and to increase opportunities for having fun to alleviate stress.  Shelley Vasquez, [facility]

## 2024-04-09 ENCOUNTER — Ambulatory Visit: Admitting: Professional Counselor

## 2024-04-09 NOTE — Progress Notes (Signed)
 [facility] [facility] Session  Patient Details  Name: Shelley Vasquez MRN: [id number] Date of Birth: 13-Feb-1966 Age: 58 y.o. PCP: Teresa Channel, MD  Vitals:   04/09/24 1601  Weight: 173 lb (78.5 kg)     [facility] Weekly seesion - 04/09/24 1600       [facility] PREP Location   [facility] PRE[facility] Location [facility]      Weekly Session   Topic Discussed Calorie breakdown    Minutes exercised this week 310 minutes    Classes attended to date [address] 04/09/2024, 4:04 PM

## 2024-04-10 ENCOUNTER — Ambulatory Visit
Admission: RE | Admit: 2024-04-10 | Discharge: 2024-04-10 | Disposition: A | Discharge status: Home / Self Care | Source: Ambulatory Visit | Attending: Family Medicine | Admitting: Family Medicine

## 2024-04-10 DIAGNOSIS — Z1231 Encounter for screening mammogram for malignant neoplasm of breast: Secondary | ICD-10-CM | POA: Diagnosis not present

## 2024-04-11 ENCOUNTER — Other Ambulatory Visit (HOSPITAL_COMMUNITY): Payer: Self-pay

## 2024-04-13 ENCOUNTER — Other Ambulatory Visit (HOSPITAL_COMMUNITY): Payer: Self-pay

## 2024-04-14 ENCOUNTER — Other Ambulatory Visit (HOSPITAL_COMMUNITY): Payer: Self-pay

## 2024-04-15 ENCOUNTER — Other Ambulatory Visit (HOSPITAL_COMMUNITY): Payer: Self-pay

## 2024-04-16 ENCOUNTER — Other Ambulatory Visit: Payer: Self-pay

## 2024-04-16 ENCOUNTER — Other Ambulatory Visit (HOSPITAL_COMMUNITY): Payer: Self-pay

## 2024-04-16 NOTE — Progress Notes (Signed)
 [facility] [facility] Weekly Session  Patient Details  Name: Shelley Vasquez MRN: [id number] Date of Birth: 13-Oct-1965 Age: 58 y.o. PCP: Teresa Channel, MD  Vitals:   04/16/24 1606  Weight: 168 lb 8 oz (76.4 kg)     [facility] Weekly seesion - 04/16/24 1600       [facility] PRE[facility] Location   [facility] PRE[facility] Location [facility]      Weekly Session   Topic Discussed Finding support;Hitting roadblocks   Open talk on how class is now feeling. Hitting road block having support. Shoe the differents in 100 cal food with add sugars   Minutes exercised this week 280 minutes    Classes attended to date 17          Jerona Irving 04/16/2024, 4:08 PM

## 2024-04-18 ENCOUNTER — Other Ambulatory Visit: Payer: Self-pay

## 2024-04-18 ENCOUNTER — Other Ambulatory Visit (HOSPITAL_COMMUNITY): Payer: Self-pay

## 2024-04-19 ENCOUNTER — Other Ambulatory Visit (HOSPITAL_COMMUNITY): Payer: Self-pay

## 2024-04-20 ENCOUNTER — Other Ambulatory Visit: Payer: Self-pay

## 2024-04-20 ENCOUNTER — Other Ambulatory Visit (HOSPITAL_COMMUNITY): Payer: Self-pay

## 2024-04-23 ENCOUNTER — Ambulatory Visit: Admitting: Professional Counselor

## 2024-04-23 NOTE — Progress Notes (Signed)
 [facility] Evaluation  Patient Details  Name: Shelley Vasquez MRN: [id number] Date of Birth: [birth year] Age: 58 y.o. PCP: Teresa Channel, MD  Vitals:   04/23/24 1547  BP: (!) 153/99  Pulse: 64  SpO2: 98%  Weight: 171 lb (77.6 kg)     [facility] Eval - 04/23/24 1500       [facility] PREP Location   [facility] PRE[facility] Location [facility]      Referral    Program Start Date 02/06/24    Program End Date 04/23/24      Measurement   Waist Circumference 34 inches    Hip Circumference 38.75 inches    Body fat 27.6 percent      Mobility and Daily Activities   I find it easy to walk up or down two or more flights of stairs. 1    I have no trouble taking out the trash. 4    I do housework such as vacuuming and dusting on my own without difficulty. 2    I can easily lift a gallon of milk (8lbs). 4    I can easily walk a mile. 1    I have no trouble reaching into high cupboards or reaching down to pick up something from the floor. 2    I do not have trouble doing out-door work such as loss adjuster, chartered, raking leaves, or gardening. 1      Mobility and Daily Activities   I feel younger than my age. 3    I feel independent. 2    I feel energetic. 2    I live an active life.  2    I feel strong. 2    I feel healthy. 3    I feel active as other people my age. 1      How fit and strong are you.   Fit and Strong Total Score 30         Past Medical History:  Diagnosis Date   Anxiety    Arthritis    Bipolar disorder (HCC)    Depression    Hypertension    Sleep apnea    does not use CPAP   Past Surgical History:  Procedure Laterality Date   ABDOMINAL HYSTERECTOMY     10/2010   ANTERIOR LAT LUMBAR FUSION Left 02/12/2018   Procedure: Lumbar Two - Three Lumbar Three - Four Anterolateral lumbar decompression/fusion with lateral plate;  Surgeon: Colon Shove, MD;  Location: [facility];  Service: Neurosurgery;  Laterality: Left;  Lumbar Two - Three Lumbar Three - Four Anterolateral lumbar  decompression/fusion with lateral plate   BACK SURGERY     2011 lumbar 4-5 fusion   BIOPSY  06/26/2018   Procedure: BIOPSY;  Surgeon: Saintclair Jasper, MD;  Location: [facility];  Service: Gastroenterology;;  EGD and Colon   BREAST SURGERY  01/2010   breast reduction   CHOLECYSTECTOMY N/A 05/08/2019   Procedure: LAPAROSCOPIC CHOLECYSTECTOMY WITH INTRAOPERATIVE CHOLANGIOGRAM;  Surgeon: Vernetta Berg, MD;  [facility];  Service: General;  Laterality: N/A;   COLONOSCOPY N/A 06/26/2018   Procedure: COLONOSCOPY;  Surgeon: Saintclair Jasper, MD;  Location: [facility];  Service: Gastroenterology;  Laterality: N/A;   ESOPHAGOGASTRODUODENOSCOPY N/A 06/26/2018   Procedure: ESOPHAGOGASTRODUODENOSCOPY (EGD);  Surgeon: Saintclair Jasper, MD;  Location: [facility];  Service: Gastroenterology;  Laterality: N/A;   left knee arthroscopic Bilateral    2016 bilateral   REDUCTION MAMMAPLASTY Bilateral    TOE SURGERY     Social History  Tobacco Use  Smoking Status Never  Smokeless Tobacco Never  Shelley Vasquez completed PREP on November 4th. Increased cardio march from 145 to 225 Increased Sit and Stand from 4 to 7 Increased arm curls from 8 to 15 Improved balance on both sides How Fit and Strong went from 20 to 30 Went from 172.8 lbs to 171 lbs Lost 1.75 inches in her waist Lost 4.25 inches in her hips   Suzen Ash 04/23/2024, 3:49 PM

## 2024-04-24 ENCOUNTER — Ambulatory Visit (INDEPENDENT_AMBULATORY_CARE_PROVIDER_SITE_OTHER): Admitting: Professional Counselor

## 2024-04-24 ENCOUNTER — Encounter: Payer: Self-pay | Admitting: Professional Counselor

## 2024-04-24 DIAGNOSIS — F330 Major depressive disorder, recurrent, mild: Secondary | ICD-10-CM

## 2024-04-24 DIAGNOSIS — F411 Generalized anxiety disorder: Secondary | ICD-10-CM | POA: Diagnosis not present

## 2024-04-24 NOTE — Progress Notes (Signed)
      [facility] Counselor/Therapist Progress Note  Patient ID: Shelley Vasquez, MRN: [id number],    Date: 04/24/2024  Time Spent: 3:07 PM to 4:04 PM  Treatment Type: Family with patient  Patient was present with spouse for session.  Reported Symptoms: Grief/loss, worries, stress, caregiver strain, sadness, anxiousness, interpersonal concerns, self-esteem concerns, automatic negative thoughts  Mental Status Exam:  Appearance:   Neat     Behavior:  Appropriate and Sharing  Motor:  Normal  Speech/Language:   Clear and Coherent and Normal Rate  Affect:  Tearful  Mood:  sad  Thought process:  normal  Thought content:    WNL  Sensory/Perceptual disturbances:    WNL  Orientation:  oriented to person, place, time/date, and situation  Attention:  Good  Concentration:  Good  Memory:  WNL  Fund of knowledge:   Good  Insight:    Good  Judgment:   Good  Impulse Control:  Good   Risk Assessment: Danger to Self:  No Self-injurious Behavior: No Danger to Others: No Duty to Warn:no Physical Aggression / Violence:No  Access to Firearms a concern: No  Gang Involvement:No   Subjective: Patient presented to session to address concerns of anxiety and depression.  She reported mixed progress at this time.  She presented to session with her spouse, and processed experience of grief and loss around trajectory of marital relationship in terms of she and husband's health concerns.  Counselor helped to honor and normalize expressions of grief and loss due to circumstances.  Patient identified spouses most recent health concerns as exacerbating her experience of stress and worry, with her desire to be able to meet his needs but to be unsure of herself.  Counselor actively listened and helped to facilitate insight between patient and spouse, helped to develop strategies for improvement, and helped patient with cognitive restructuring around negative self-talk.  They discussed relating to 1 another  mindfully, and being aware of each others perspectives and needs.  Counselor discussed strategies such as holding an object when it is each is turned to talk, and lengthening pauses between responses, and paying attention to nonverbal cues.    Interventions: Solution-Oriented/Positive Psychology, Humanistic/Existential, Insight-Oriented, and Interpersonal, CBT  Diagnosis:   ICD-10-CM   1. Generalized anxiety disorder  F41.1     2. Major depressive disorder, recurrent episode, mild  F33.0       Plan: Patient is scheduled for follow-up; continue process work and developing coping skills.  Patient short-term goal between sessions to practice positive self-talk, and work with spouse on interpersonal effectiveness skills as discussed in session.  Shelley Vasquez, [facility]

## 2024-05-06 ENCOUNTER — Other Ambulatory Visit (HOSPITAL_COMMUNITY): Payer: Self-pay

## 2024-05-06 ENCOUNTER — Ambulatory Visit: Admitting: Professional Counselor

## 2024-05-06 ENCOUNTER — Encounter: Payer: Self-pay | Admitting: Professional Counselor

## 2024-05-06 DIAGNOSIS — F331 Major depressive disorder, recurrent, moderate: Secondary | ICD-10-CM

## 2024-05-06 DIAGNOSIS — F411 Generalized anxiety disorder: Secondary | ICD-10-CM | POA: Diagnosis not present

## 2024-05-06 NOTE — Progress Notes (Signed)
 Given       [facility] Counselor/Therapist Progress Note  Patient ID: Shelley Vasquez, MRN: [id number],    Date: 05/06/2024  Time Spent: 2:15 PM to 3:11 PM  Treatment Type: Individual Therapy  Reported Symptoms: Tearfulness, sadness, grief/loss, low mood, caregiver strain, worries, stress, anxiousness, fatigue, phase of life concerns, interpersonal concerns, trouble relaxing  Mental Status Exam:  Appearance:   Neat     Behavior:  Appropriate and Sharing  Motor:  Normal  Speech/Language:   Clear and Coherent and Normal Rate  Affect:  Tearful  Mood:  sad  Thought process:  normal  Thought content:    WNL  Sensory/Perceptual disturbances:    WNL  Orientation:  oriented to person, place, time/date, and situation  Attention:  Good  Concentration:  Good  Memory:  WNL  Fund of knowledge:   Good  Insight:    Good  Judgment:   Good  Impulse Control:  Good   Risk Assessment: Danger to Self:  No Self-injurious Behavior: No Danger to Others: No Duty to Warn:no Physical Aggression / Violence:No  Access to Firearms a concern: No  Gang Involvement:No   Subjective: Patient presented to session to address concerns of anxiety and depression.  She reported minimal progress at this time.  She processed experience of challenging dynamics with her husband given his health concerns at this time.  She processed the experience of his challenges and how they affect him, her, their life and marriage.  She mourned sense of grief and loss around the couple she feels they had been, and their new normal.  Counselor actively listened, affirmed patient feelings and experience, and held space for patient sense of grief.  Counselor assisted patient in identifying strategies to improve challenges.  Counselor and patient discussed patient asking fewer questions, prioritizing rest, relaxation, and pace of both conversations and daily life activities, consider doing less, narrowing, condensing and simplifying  life tasks and output, and to increase her own creative endeavors for stress relief.  Counselor assisted patient with strategies and how to address involuntary negative dynamics that have arisen such as patient sense that she is doing something wrong; patient processed ways in which current experience resonates with childhood.  Counselor assisted with cognitive restructuring around self-esteem concerns.  Counselor and patient role-played regarding strategies and approach to difficult conversation.  They discussed patient honoring and normalizing this time of adjustment while also acknowledging and resourcing her resiliency.  Interventions: Solution-Oriented/Positive Psychology, Humanistic/Existential, Insight-Oriented, and Interpersonal, CBT  Diagnosis:   ICD-10-CM   1. Major depressive disorder, recurrent episode, moderate (HCC)  F33.1     2. Generalized anxiety disorder  F41.1       Plan: Patient is scheduled for follow-up; continue process work and developing coping skills.  Patient short-term goal between sessions to limit ways in which life stressors overwhelm caregiving role, and work to implement strategies for increased rest, relaxation, simplification and doing less.  Practice interpersonal effectiveness strategies discussed, and increase positive self-talk and creative outlets.  Shelley Vasquez, [facility]

## 2024-05-10 ENCOUNTER — Other Ambulatory Visit (HOSPITAL_COMMUNITY): Payer: Self-pay

## 2024-05-13 ENCOUNTER — Other Ambulatory Visit (HOSPITAL_COMMUNITY): Payer: Self-pay

## 2024-05-14 ENCOUNTER — Other Ambulatory Visit (HOSPITAL_COMMUNITY): Payer: Self-pay

## 2024-05-15 ENCOUNTER — Other Ambulatory Visit (HOSPITAL_COMMUNITY): Payer: Self-pay

## 2024-05-17 ENCOUNTER — Other Ambulatory Visit (HOSPITAL_COMMUNITY): Payer: Self-pay

## 2024-05-18 ENCOUNTER — Other Ambulatory Visit (HOSPITAL_COMMUNITY): Payer: Self-pay

## 2024-05-20 ENCOUNTER — Other Ambulatory Visit (HOSPITAL_COMMUNITY): Payer: Self-pay

## 2024-05-21 ENCOUNTER — Ambulatory Visit: Admitting: Professional Counselor

## 2024-05-27 ENCOUNTER — Other Ambulatory Visit (HOSPITAL_COMMUNITY): Payer: Self-pay

## 2024-05-27 DIAGNOSIS — M47816 Spondylosis without myelopathy or radiculopathy, lumbar region: Secondary | ICD-10-CM | POA: Diagnosis not present

## 2024-05-27 DIAGNOSIS — M40204 Unspecified kyphosis, thoracic region: Secondary | ICD-10-CM | POA: Diagnosis not present

## 2024-05-27 DIAGNOSIS — M25512 Pain in left shoulder: Secondary | ICD-10-CM | POA: Diagnosis not present

## 2024-05-27 DIAGNOSIS — G894 Chronic pain syndrome: Secondary | ICD-10-CM | POA: Diagnosis not present

## 2024-05-27 MED ORDER — FENTANYL 25 MCG/HR TD PT72
1.0000 | MEDICATED_PATCH | TRANSDERMAL | 0 refills | Status: AC
  Filled 2024-07-10: qty 15, 30d supply, fill #0

## 2024-05-27 MED ORDER — OXYCODONE-ACETAMINOPHEN 5-325 MG PO TABS
1.0000 | ORAL_TABLET | ORAL | 0 refills | Status: AC | PRN
  Filled 2024-06-18: qty 120, 30d supply, fill #0

## 2024-05-27 MED ORDER — FENTANYL 25 MCG/HR TD PT72
1.0000 | MEDICATED_PATCH | TRANSDERMAL | 0 refills | Status: AC
  Filled 2024-06-11: qty 15, 30d supply, fill #0
  Filled ????-??-??: fill #0

## 2024-05-27 MED ORDER — OXYCODONE-ACETAMINOPHEN 5-325 MG PO TABS
1.0000 | ORAL_TABLET | Freq: Four times a day (QID) | ORAL | 0 refills | Status: AC | PRN
  Filled 2024-07-17: qty 120, 30d supply, fill #0

## 2024-05-28 ENCOUNTER — Other Ambulatory Visit (HOSPITAL_COMMUNITY): Payer: Self-pay

## 2024-06-04 ENCOUNTER — Encounter: Payer: Self-pay | Admitting: Professional Counselor

## 2024-06-04 ENCOUNTER — Ambulatory Visit (INDEPENDENT_AMBULATORY_CARE_PROVIDER_SITE_OTHER): Admitting: Professional Counselor

## 2024-06-04 DIAGNOSIS — F332 Major depressive disorder, recurrent severe without psychotic features: Secondary | ICD-10-CM

## 2024-06-04 DIAGNOSIS — F411 Generalized anxiety disorder: Secondary | ICD-10-CM

## 2024-06-04 NOTE — Progress Notes (Signed)
"   °      [facility] Counselor/Therapist Progress Note  Patient ID: Shelley Vasquez, MRN: [id number],    Date: 06/04/2024  Time Spent: 1:09 PM to 2:12 PM  Treatment Type: Individual Therapy  Reported Symptoms: fatigue, tearfulness, restlessness, low mood, anhedonia, low motivation, worries, caregiver strain, trouble concentrating, sense of overwhelm, stress  Mental Status Exam:  Appearance:   Neat     Behavior:  Appropriate and Sharing  Motor:  Normal  Speech/Language:   Clear and Coherent and Normal Rate  Affect:  Tearful  Mood:  sad, tired  Thought process:  normal  Thought content:    WNL  Sensory/Perceptual disturbances:    WNL  Orientation:  oriented to person, place, time/date, and situation  Attention:  Good  Concentration:  Good  Memory:  WNL  Fund of knowledge:   Good  Insight:    Good  Judgment:   Good  Impulse Control:  Good   Risk Assessment: Danger to Self:  No Self-injurious Behavior: No Danger to Others: No Duty to Warn:no Physical Aggression / Violence:No  Access to Firearms a concern: No  Gang Involvement:No   Subjective: Patient presented to session to address concerns of anxiety and depression.  She reported minimal progress at this time.  Patient processed the experience of the new normal and she and husband's life as relates to his health challenges and impact on their relationship.  She identified occasion where she self advocated during time of strain, and counselor reinforced patient proactive efforts and reinforced patient's sense of what is hers and his to be accountable for.  They discussed complexity of factors and how personalities and dispositions mix with health changes and impact.  Counselor continued to encourage couples counseling for patient and spouse.  Patient processed experience of so much difficulty in the past year for she and her husband.  She reported to be looking into Spravato treatments at practice per consultation with her  psychiatrist.  Counselor and patient discussed patient progress through course of treatment, discussed her updated treatment plan, and patient gave her consent.  Interventions: Solution-Oriented/Positive Psychology, Humanistic/Existential, and Insight-Oriented, Treatment Planning, Assertiveness/Communication  Diagnosis:   ICD-10-CM   1. Severe episode of recurrent major depressive disorder, without psychotic features (HCC)  F33.2     2. Generalized anxiety disorder  F41.1       Plan: Patient is scheduled for follow-up; continue process work and developing coping skills.  Patient to continue to self advocate as needed and utilize interpersonal effectiveness strategies to improve communications and harmony within marital relationship, and continue to prioritize self-care, rest and relaxation.  Continue to resource support system including spiritual, familial, social and medical, and consider couples counseling.  Almarie ONEIDA Sprang, [facility]                   "

## 2024-06-08 ENCOUNTER — Other Ambulatory Visit (HOSPITAL_COMMUNITY): Payer: Self-pay

## 2024-06-10 ENCOUNTER — Other Ambulatory Visit: Payer: Self-pay

## 2024-06-10 ENCOUNTER — Other Ambulatory Visit (HOSPITAL_COMMUNITY): Payer: Self-pay

## 2024-06-11 ENCOUNTER — Other Ambulatory Visit: Payer: Self-pay

## 2024-06-11 ENCOUNTER — Other Ambulatory Visit (HOSPITAL_COMMUNITY): Payer: Self-pay

## 2024-06-12 ENCOUNTER — Other Ambulatory Visit (HOSPITAL_COMMUNITY): Payer: Self-pay

## 2024-06-14 ENCOUNTER — Other Ambulatory Visit (HOSPITAL_COMMUNITY): Payer: Self-pay

## 2024-06-17 ENCOUNTER — Other Ambulatory Visit (HOSPITAL_COMMUNITY): Payer: Self-pay

## 2024-06-18 ENCOUNTER — Encounter: Payer: Self-pay | Admitting: Professional Counselor

## 2024-06-18 ENCOUNTER — Other Ambulatory Visit (HOSPITAL_COMMUNITY): Payer: Self-pay

## 2024-06-18 ENCOUNTER — Ambulatory Visit (INDEPENDENT_AMBULATORY_CARE_PROVIDER_SITE_OTHER): Admitting: Professional Counselor

## 2024-06-18 DIAGNOSIS — F411 Generalized anxiety disorder: Secondary | ICD-10-CM

## 2024-06-18 DIAGNOSIS — F332 Major depressive disorder, recurrent severe without psychotic features: Secondary | ICD-10-CM | POA: Diagnosis not present

## 2024-06-18 NOTE — Progress Notes (Unsigned)
"   °      [facility] Counselor/Therapist Progress Note  Patient ID: Shelley Vasquez, MRN: [id number],    Date: 06/18/2024  Time Spent: 1:07 PM to 2:10 PM  Treatment Type: Individual Therapy  Reported Symptoms: tearfulness, anhedonia, low mood, excess sleep, worries, stress, fatigue, interpersonal concerns, sadness, elf esteem concerns, low motivation, socially isolating tendencies  Mental Status Exam:  Appearance:   Neat     Behavior:  Appropriate and Sharing  Motor:  Normal  Speech/Language:   Clear and Coherent and Normal Rate  Affect:  Appropriate, Blunt, Depressed, and Tearful  Mood:  depressed and sad  Thought process:  normal  Thought content:    WNL  Sensory/Perceptual disturbances:    WNL  Orientation:  oriented to person, place, time/date, and situation  Attention:  Good  Concentration:  Good  Memory:  WNL  Fund of knowledge:   Good  Insight:    Good  Judgment:   Good  Impulse Control:  Good   Risk Assessment: Danger to Self:  No Self-injurious Behavior: No Danger to Others: No Duty to Warn:no Physical Aggression / Violence:No  Access to Firearms a concern: No  Gang Involvement:No   Subjective: Patient presented to session to address concerns of depression and anxiety.  She reported minimal progress at this time.  Patient voiced having celebrated she and her husband's anniversary, and what would have been her parents today with her mom.  She identified to be continuing to look into Spravato treatments with her prescribing provider; she identified having recent increase in medication with hopes for helpfulness.  She processed the experience of ongoing stress in the past year and counselor provided psychoeducation regarding toxic stress and implications on her wellbeing.  Patient identified having been sleeping up to 18 hours a day and trying to reduce this to 14.  She identified sleeping as depression related and pertaining to avoiding any tension maritally.  Counselor  assisted patient in reflecting on her strengths and resiliency over time, and her experience of survival mode this past year where her husband's health is concerned.  Counselor help to validate patient feelings and experience and normalize stress response, while helping to resource agency into patient motivation to resource adaptive coping skills including social opportunities and creative outlet via her crafting.  Interventions: Solution-Oriented/Positive Psychology, Humanistic/Existential, Insight-Oriented, and Resourcing  Diagnosis:   ICD-10-CM   1. Severe episode of recurrent major depressive disorder, without psychotic features (HCC)  F33.2     2. Generalized anxiety disorder  F41.1       Plan: Patient is scheduled for follow-up; continue process work and developing coping skills.  Patient short-term goal between sessions to focus on paced, low volume changes such as choosing 1 social opportunity a week to engage in, such as church; continue to resource spirituality, and consider starting online crafting group in the new year to help with motivation toward creative outlet.  Shelley Vasquez, [facility]                   "

## 2024-07-02 ENCOUNTER — Other Ambulatory Visit (HOSPITAL_COMMUNITY): Payer: Self-pay

## 2024-07-05 ENCOUNTER — Other Ambulatory Visit (HOSPITAL_COMMUNITY): Payer: Self-pay

## 2024-07-08 ENCOUNTER — Other Ambulatory Visit: Payer: Self-pay

## 2024-07-08 ENCOUNTER — Other Ambulatory Visit (HOSPITAL_COMMUNITY): Payer: Self-pay

## 2024-07-08 ENCOUNTER — Other Ambulatory Visit (HOSPITAL_BASED_OUTPATIENT_CLINIC_OR_DEPARTMENT_OTHER): Payer: Self-pay

## 2024-07-09 ENCOUNTER — Other Ambulatory Visit (HOSPITAL_COMMUNITY): Payer: Self-pay

## 2024-07-10 ENCOUNTER — Other Ambulatory Visit (HOSPITAL_COMMUNITY): Payer: Self-pay

## 2024-07-12 ENCOUNTER — Other Ambulatory Visit (HOSPITAL_COMMUNITY): Payer: Self-pay

## 2024-07-16 ENCOUNTER — Other Ambulatory Visit (HOSPITAL_COMMUNITY): Payer: Self-pay

## 2024-07-17 ENCOUNTER — Other Ambulatory Visit (HOSPITAL_COMMUNITY): Payer: Self-pay

## 2024-07-17 ENCOUNTER — Encounter: Payer: Self-pay | Admitting: Professional Counselor

## 2024-07-17 ENCOUNTER — Other Ambulatory Visit: Payer: Self-pay

## 2024-07-17 ENCOUNTER — Telehealth (INDEPENDENT_AMBULATORY_CARE_PROVIDER_SITE_OTHER): Admitting: Professional Counselor

## 2024-07-17 DIAGNOSIS — F332 Major depressive disorder, recurrent severe without psychotic features: Secondary | ICD-10-CM | POA: Diagnosis not present

## 2024-07-17 DIAGNOSIS — F411 Generalized anxiety disorder: Secondary | ICD-10-CM

## 2024-07-17 NOTE — Progress Notes (Signed)
 "       [facility] Counselor/Therapist Progress Note  Patient ID: Shelley Vasquez, MRN: [id number],    Date: 07/17/2024  Time Spent: 2:00 PM - 2:58 PM   Treatment Type: Individual Therapy  I connected with this patient by an approved telecommunication method (video), with her informed consent, and verifying identity and patient privacy.  I was located at my office and patient at her home.  As needed, we discussed the limitations, risks, and security and privacy concerns associated with telehealth service, including the availability and conditions which currently govern in-person appointments and the possibility that 3rd-party payment may not be fully guaranteed and she may be responsible for charges.  After she indicated understanding, we proceeded with the session.  Also discussed treatment planning, as needed, including ongoing verbal agreement with the plan, the opportunity to ask and answer all questions, her demonstrated understanding of instructions, and her readiness to call the office should symptoms worsen or she feels she is in a crisis state and needs more immediate and tangible assistance.   Reported Symptoms: low energy, low motivation, low mood, anhedonia, sadness, worries, tearfulness, fatigue, trouble concentrating  Mental Status Exam:  Appearance:   Casual     Behavior:  Appropriate and Sharing  Motor:  Normal  Speech/Language:   Clear and Coherent and Normal Rate  Affect:  Appropriate and Congruent  Mood:  normal  Thought process:  normal  Thought content:    WNL  Sensory/Perceptual disturbances:    WNL  Orientation:  oriented to person, place, time/date, and situation  Attention:  Good  Concentration:  Good  Memory:  WNL  Fund of knowledge:   Good  Insight:    Good  Judgment:   Good  Impulse Control:  Good   Risk Assessment: Danger to Self:  No Self-injurious Behavior: No Danger to Others: No Duty to Warn:no Physical Aggression / Violence:No  Access to Firearms  a concern: No  Gang Involvement:No   Subjective: Patient presented to session to address concerns of depression and anxiety.  She reported progress at this time.  Patient identified communication with husband to have improved, and for her sleep to have improved, and overall slight improvement in depressive symptomology.  She reported motivational concerns as particularly challenging.  Patient identified increase in Viibryd  by prescribing provider in December to potentially be causing erratic behavior where she acts differently than she normally would.  Counselor affirmed patient progress and encouraged follow-up with prescribing provider to discuss symptoms.  Counselor assisted patient in developing strategies for continued cleaning out of home, and helped patient to resource creative ideas, and encouraged her engaging with online community around crafts.  Patient processed experience of a friend who is struggling, and counselor discussed concern with patient, and encouraged patient energetic boundaries and blending quality time with friend to entail both emoting and distracting activities for healthy balance.  Interventions: Solution-Oriented/Positive Psychology, Humanistic/Existential, Insight-Oriented, and Resourcing  Diagnosis:   ICD-10-CM   1. Severe episode of recurrent major depressive disorder, without psychotic features (HCC)  F33.2     2. Generalized anxiety disorder  F41.1       Plan: Patient is scheduled for follow-up; continue process work and developing coping skills.  Patient short-term goal between sessions to implement strategies in support of friend in need that also supports herself as discussed, continue to clean out strategically, consider follow-up with prescribing provider regarding medication side effects, and continue to resource motivation around creative efforts.  Almarie ONEIDA Sprang, [facility]                   "

## 2024-07-24 ENCOUNTER — Other Ambulatory Visit (HOSPITAL_COMMUNITY): Payer: Self-pay

## 2024-07-25 ENCOUNTER — Other Ambulatory Visit (HOSPITAL_COMMUNITY): Payer: Self-pay

## 2024-07-25 MED ORDER — FENTANYL 25 MCG/HR TD PT72: 1.0000 | MEDICATED_PATCH | TRANSDERMAL | 0 refills | Status: AC

## 2024-07-25 MED ORDER — OXYCODONE-ACETAMINOPHEN 5-325 MG PO TABS: 1.0000 | ORAL_TABLET | Freq: Four times a day (QID) | ORAL | 0 refills | Status: AC | PRN

## 2024-07-26 ENCOUNTER — Other Ambulatory Visit (HOSPITAL_COMMUNITY): Payer: Self-pay

## 2024-07-31 ENCOUNTER — Ambulatory Visit: Admitting: Professional Counselor

## 2024-08-13 ENCOUNTER — Ambulatory Visit: Admitting: Professional Counselor

## 2024-08-27 ENCOUNTER — Ambulatory Visit: Admitting: Professional Counselor

## 2024-09-10 ENCOUNTER — Ambulatory Visit: Admitting: Professional Counselor

## 2024-09-24 ENCOUNTER — Ambulatory Visit: Admitting: Professional Counselor

## 2024-10-08 ENCOUNTER — Ambulatory Visit: Admitting: Professional Counselor
